# Patient Record
Sex: Female | Born: 1950 | Race: White | Hispanic: No | Marital: Married | Smoking: Former smoker
Health system: Southern US, Community
[De-identification: ages and names within clinical notes are randomized; demographics above are authoritative.]

## PROBLEM LIST (undated history)

## (undated) DIAGNOSIS — J309 Allergic rhinitis, unspecified: Secondary | ICD-10-CM

## (undated) DIAGNOSIS — F419 Anxiety disorder, unspecified: Secondary | ICD-10-CM

## (undated) DIAGNOSIS — I10 Essential (primary) hypertension: Secondary | ICD-10-CM

## (undated) DIAGNOSIS — R922 Inconclusive mammogram: Secondary | ICD-10-CM

## (undated) DIAGNOSIS — N841 Polyp of cervix uteri: Secondary | ICD-10-CM

## (undated) DIAGNOSIS — IMO0002 Reserved for concepts with insufficient information to code with codable children: Secondary | ICD-10-CM

## (undated) DIAGNOSIS — I480 Paroxysmal atrial fibrillation: Secondary | ICD-10-CM

## (undated) DIAGNOSIS — E7800 Pure hypercholesterolemia, unspecified: Secondary | ICD-10-CM

## (undated) DIAGNOSIS — M8580 Other specified disorders of bone density and structure, unspecified site: Secondary | ICD-10-CM

## (undated) DIAGNOSIS — R9230 Dense breasts, unspecified: Secondary | ICD-10-CM

## (undated) HISTORY — DX: Dense breasts, unspecified: R92.30

## (undated) HISTORY — DX: Pure hypercholesterolemia, unspecified: E78.00

## (undated) HISTORY — DX: Essential (primary) hypertension: I10

## (undated) HISTORY — DX: Other specified disorders of bone density and structure, unspecified site: M85.80

## (undated) HISTORY — DX: Allergic rhinitis, unspecified: J30.9

## (undated) HISTORY — DX: Inconclusive mammogram: R92.2

## (undated) HISTORY — DX: Reserved for concepts with insufficient information to code with codable children: IMO0002

## (undated) HISTORY — DX: Polyp of cervix uteri: N84.1

## (undated) SURGICAL SUPPLY — 1 items: PAD DEFIB RADIO PHYSIO CONN (PAD) ×1 IMPLANT

---

## 1992-11-14 DIAGNOSIS — R87619 Unspecified abnormal cytological findings in specimens from cervix uteri: Secondary | ICD-10-CM

## 1992-11-14 DIAGNOSIS — IMO0002 Reserved for concepts with insufficient information to code with codable children: Secondary | ICD-10-CM

## 1992-11-14 HISTORY — DX: Reserved for concepts with insufficient information to code with codable children: IMO0002

## 1992-11-14 HISTORY — DX: Unspecified abnormal cytological findings in specimens from cervix uteri: R87.619

## 1993-01-12 DIAGNOSIS — N841 Polyp of cervix uteri: Secondary | ICD-10-CM | POA: Insufficient documentation

## 1993-01-12 HISTORY — DX: Polyp of cervix uteri: N84.1

## 1993-01-27 HISTORY — PX: OTHER SURGICAL HISTORY: SHX169

## 1993-03-14 HISTORY — PX: CERVICAL BIOPSY  W/ LOOP ELECTRODE EXCISION: SUR135

## 1999-12-22 ENCOUNTER — Encounter: Payer: Self-pay | Admitting: Obstetrics and Gynecology

## 1999-12-22 ENCOUNTER — Encounter: Admission: RE | Admit: 1999-12-22 | Discharge: 1999-12-22 | Payer: Self-pay | Admitting: Obstetrics and Gynecology

## 2001-06-22 ENCOUNTER — Other Ambulatory Visit: Admission: RE | Admit: 2001-06-22 | Discharge: 2001-06-22 | Payer: Self-pay | Admitting: Obstetrics and Gynecology

## 2003-02-20 ENCOUNTER — Other Ambulatory Visit: Admission: RE | Admit: 2003-02-20 | Discharge: 2003-02-20 | Payer: Self-pay | Admitting: Obstetrics and Gynecology

## 2003-03-10 ENCOUNTER — Encounter: Payer: Self-pay | Admitting: Obstetrics and Gynecology

## 2003-03-10 ENCOUNTER — Encounter: Admission: RE | Admit: 2003-03-10 | Discharge: 2003-03-10 | Payer: Self-pay | Admitting: Obstetrics and Gynecology

## 2004-07-08 ENCOUNTER — Other Ambulatory Visit: Admission: RE | Admit: 2004-07-08 | Discharge: 2004-07-08 | Payer: Self-pay | Admitting: Obstetrics and Gynecology

## 2004-09-16 ENCOUNTER — Encounter: Admission: RE | Admit: 2004-09-16 | Discharge: 2004-09-16 | Payer: Self-pay | Admitting: Obstetrics and Gynecology

## 2006-05-24 ENCOUNTER — Other Ambulatory Visit: Admission: RE | Admit: 2006-05-24 | Discharge: 2006-05-24 | Payer: Self-pay | Admitting: Obstetrics and Gynecology

## 2007-06-01 ENCOUNTER — Other Ambulatory Visit: Admission: RE | Admit: 2007-06-01 | Discharge: 2007-06-01 | Payer: Self-pay | Admitting: Obstetrics and Gynecology

## 2008-05-20 ENCOUNTER — Encounter: Admission: RE | Admit: 2008-05-20 | Discharge: 2008-05-20 | Payer: Self-pay | Admitting: Obstetrics and Gynecology

## 2008-06-13 ENCOUNTER — Other Ambulatory Visit: Admission: RE | Admit: 2008-06-13 | Discharge: 2008-06-13 | Payer: Self-pay | Admitting: Obstetrics and Gynecology

## 2010-07-27 ENCOUNTER — Encounter: Admission: RE | Admit: 2010-07-27 | Discharge: 2010-07-27 | Payer: Self-pay | Admitting: Obstetrics and Gynecology

## 2010-07-27 LAB — HM DEXA SCAN

## 2011-08-10 ENCOUNTER — Other Ambulatory Visit: Payer: Self-pay | Admitting: Obstetrics and Gynecology

## 2011-08-10 DIAGNOSIS — Z1231 Encounter for screening mammogram for malignant neoplasm of breast: Secondary | ICD-10-CM

## 2011-09-19 ENCOUNTER — Ambulatory Visit: Payer: Self-pay

## 2011-10-25 ENCOUNTER — Ambulatory Visit
Admission: RE | Admit: 2011-10-25 | Discharge: 2011-10-25 | Disposition: A | Discharge status: Home / Self Care | Payer: BC Managed Care – PPO | Source: Ambulatory Visit | Attending: Obstetrics and Gynecology | Admitting: Obstetrics and Gynecology

## 2011-10-25 DIAGNOSIS — Z1231 Encounter for screening mammogram for malignant neoplasm of breast: Secondary | ICD-10-CM

## 2012-09-14 LAB — HM PAP SMEAR

## 2013-07-19 ENCOUNTER — Other Ambulatory Visit: Payer: Self-pay | Admitting: Family Medicine

## 2013-07-19 DIAGNOSIS — Z1231 Encounter for screening mammogram for malignant neoplasm of breast: Secondary | ICD-10-CM

## 2013-08-26 ENCOUNTER — Ambulatory Visit
Admission: RE | Admit: 2013-08-26 | Discharge: 2013-08-26 | Disposition: A | Discharge status: Home / Self Care | Payer: BC Managed Care – PPO | Source: Ambulatory Visit | Attending: Family Medicine | Admitting: Family Medicine

## 2013-08-26 DIAGNOSIS — Z1231 Encounter for screening mammogram for malignant neoplasm of breast: Secondary | ICD-10-CM

## 2013-08-27 LAB — HM MAMMOGRAPHY

## 2013-09-20 ENCOUNTER — Ambulatory Visit: Payer: Self-pay | Admitting: Obstetrics and Gynecology

## 2013-09-23 ENCOUNTER — Ambulatory Visit: Payer: Self-pay | Admitting: Obstetrics and Gynecology

## 2013-10-08 ENCOUNTER — Telehealth: Payer: Self-pay | Admitting: Obstetrics and Gynecology

## 2013-10-08 NOTE — Telephone Encounter (Signed)
Return call to patient, Destiny Elliott.  

## 2013-10-08 NOTE — Telephone Encounter (Signed)
Patient calling asking for Destiny Elliott to help her get worked into Dr. Rica Records schedule for this year for an AEX. [name] AEX was cancelled twice with Dr. Edward Jolly. Patient does want to still see Dr. Edward Jolly however.

## 2013-10-14 NOTE — Telephone Encounter (Signed)
Appointment for AEX scheduled for 11-13-13 at 1pm. Patient agreeable to date and time. Routing to provider for final review. Patient agreeable to disposition. Will close encounter

## 2013-11-13 ENCOUNTER — Ambulatory Visit: Payer: Self-pay | Admitting: Obstetrics and Gynecology

## 2013-11-29 ENCOUNTER — Encounter: Payer: Self-pay | Admitting: Nurse Practitioner

## 2013-11-29 ENCOUNTER — Ambulatory Visit (INDEPENDENT_AMBULATORY_CARE_PROVIDER_SITE_OTHER): Payer: BC Managed Care – PPO | Admitting: Nurse Practitioner

## 2013-11-29 VITALS — BP 140/84 | HR 52 | Ht 61.5 in | Wt 131.0 lb

## 2013-11-29 DIAGNOSIS — Z01419 Encounter for gynecological examination (general) (routine) without abnormal findings: Secondary | ICD-10-CM

## 2013-11-29 DIAGNOSIS — Z0000 Encounter for general adult medical examination without abnormal findings: Secondary | ICD-10-CM

## 2013-11-29 NOTE — Progress Notes (Signed)
Patient ID: Destiny Elliott, female   DOB: [birth year], 63 y.o.   MRN: [id number] 63 y.o. G2P2002 Married Caucasian Fe here for annual exam.  Son died August 02, 2013.  He was gay and on multiple medications and committed suicide.  She also has to deal with her husband who is a diabetic. She  Is seeking therapy.  No LMP recorded. Patient is postmenopausal.          Sexually active: yes  The current method of family planning is post menopausal status.    Exercising: yes  Home exercise routine includes walking. Smoker:  no  Health Maintenance: Pap:  09/14/12, WNL MMG:  08/27/13, normal Colonoscopy:  never BMD:   07/27/10, normal, repeat in 3 years TDaP:  2008 Labs: PCP   reports that she quit smoking about 9 years ago. Her smoking use included Cigarettes. She has a 14 pack-year smoking history. She has never used smokeless tobacco. She reports that she drinks alcohol. She reports that she does not use illicit drugs.  Past Medical History  Diagnosis Date  . 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 Hypertension   . Dense breasts   . Endocervical polyp 01/1993    -benign    Past Surgical History  Procedure Laterality Date  . Cervical biopsy  w/ loop electrode excision  03/1993    CIN I  . Endocervical polyp  01-27-93    -benign    Current Outpatient Prescriptions  Medication Sig Dispense Refill  . ALPRAZolam (XANAX) 0.5 MG tablet Take 1 tablet by mouth 4 (four) times daily.       Marland Kitchen atenolol (TENORMIN) 50 MG tablet Take 100 mg by mouth daily.       . cholecalciferol (VITAMIN D) 1000 UNITS tablet Take 1,000 Units by mouth daily.      . vitamin B-12 (CYANOCOBALAMIN) 1000 MCG tablet Take 1,000 mcg by mouth daily.       No current facility-administered medications for this visit.    Family History  Problem Relation Age of Onset  . Stroke Father   . Kidney disease Father   . Hypertension Father   . Thyroid disease Sister   . Hypertension Sister   . Hyperlipidemia Sister   . Thyroid disease  Mother   . Heart failure Mother   . Heart disease Mother   . Hypertension Mother   . COPD Mother   . Heart disease Brother   . Heart disease Brother     ROS:  Pertinent items are noted in HPI.  Otherwise, a comprehensive ROS was negative.  Exam:   BP 140/84  Pulse 52  Ht 5' 1.5" (1.562 m)  Wt 131 lb (59.421 kg)  BMI 24.35 kg/m2 Height: 5' 1.5" (156.2 cm)  Ht Readings from Last 3 Encounters:  11/29/13 5' 1.5" (1.562 m)   General: patient was tense, nervous, tearful and hard to remember data of dates and times of events over the past several months.  Poor historian at this time. General appearance: alert, cooperative and appears stated age Head: Normocephalic, without obvious abnormality, atraumatic Neck: no adenopathy, supple, symmetrical, trachea midline and thyroid normal to inspection and palpation Lungs: clear to auscultation bilaterally Breasts: normal appearance, no masses or tenderness Heart: regular rate and rhythm Abdomen: soft, non-tender; no masses,  no organomegaly Extremities: extremities normal, atraumatic, no cyanosis or edema Skin: Skin color, texture, turgor normal. No rashes or lesions Lymph nodes: Cervical, supraclavicular, and axillary nodes normal. No abnormal inguinal nodes  palpated Neurologic: Grossly normal   Pelvic: External genitalia:  no lesions              Urethra:  normal appearing urethra with no masses, tenderness or lesions              Bartholin's and Skene's: normal                 Vagina: normal appearing vagina with normal color and discharge, no lesions              Cervix: anteverted              Pap taken: yes Bimanual Exam:  Uterus:  normal size, contour, position, consistency, mobility, non-tender              Adnexa: no mass, fullness, tenderness               Rectovaginal: Confirms               Anus:  normal sphincter tone, no lesions  A:  Well Woman with normal exam  Postmenopausal  History of anxiety and depression that is  grief reaction - death of son 07-27-13 from suicide.  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  Counseled on breast self exam, mammography screening, adequate intake of calcium and vitamin D, diet and exercise return annually or prn  An After Visit Summary was printed and given to the patient.

## 2013-11-29 NOTE — Patient Instructions (Addendum)

## 2013-12-01 NOTE — Progress Notes (Signed)
Encounter reviewed by Dr. Whitnie Deleon Silva.  

## 2013-12-04 LAB — IPS PAP TEST WITH HPV

## 2014-06-03 ENCOUNTER — Telehealth: Payer: Self-pay | Admitting: Nurse Practitioner

## 2014-06-03 NOTE — Telephone Encounter (Signed)
Spoke with patient. Patient states that she has been constipated for months. Has been trying to eat more fiber daily but has been having to strain with bowel movements. Patient noticed "pink blood" when wiping after a bowel movement yesterday and "blood in my stool" today after a bowel movement. Patient has not had a colonoscopy but state that her sisters have and they have had polyps. With bowel movements patient states that she has not been eliminating a lot. "I have not been regular in over a year." Patient has not used any OTC stool softeners. Patient prefers to be seen by our office. Requesting early morning appointment. Appointment scheduled for tomorrow at 8:30am with Regina Eck CNM as Milford Cage, FNP will be out of the office. Patient agreeable.  Routing to [employer], FNP  Cc: Regina Eck CNM   Routing to provider for final review. Patient agreeable to disposition. Will close encounter

## 2014-06-03 NOTE — Telephone Encounter (Signed)
Patient is having rectal bleeding. Patient is asking for an appointment with Destiny Elliott.

## 2014-06-04 ENCOUNTER — Ambulatory Visit (INDEPENDENT_AMBULATORY_CARE_PROVIDER_SITE_OTHER): Payer: BC Managed Care – PPO | Admitting: Certified Nurse Midwife

## 2014-06-04 ENCOUNTER — Encounter: Payer: Self-pay | Admitting: Certified Nurse Midwife

## 2014-06-04 ENCOUNTER — Telehealth: Payer: Self-pay

## 2014-06-04 VITALS — BP 150/80 | HR 60 | Temp 98.6°F | Resp 18 | Wt 133.0 lb

## 2014-06-04 DIAGNOSIS — Z1211 Encounter for screening for malignant neoplasm of colon: Secondary | ICD-10-CM

## 2014-06-04 DIAGNOSIS — K921 Melena: Secondary | ICD-10-CM

## 2014-06-04 LAB — POCT URINALYSIS DIPSTICK
Bilirubin, UA: NEGATIVE
Blood, UA: NEGATIVE
Glucose, UA: NEGATIVE
Ketones, UA: NEGATIVE
Leukocytes, UA: NEGATIVE
Nitrite, UA: NEGATIVE
Protein, UA: NEGATIVE
Urobilinogen, UA: NEGATIVE
pH, UA: 7

## 2014-06-04 LAB — CBC WITH DIFFERENTIAL/PLATELET
Basophils Absolute: 0.1 10*3/uL (ref 0.0–0.1)
Basophils Relative: 1 % (ref 0–1)
Eosinophils Absolute: 0.1 10*3/uL (ref 0.0–0.7)
Eosinophils Relative: 1 % (ref 0–5)
HCT: 41.1 % (ref 36.0–46.0)
Hemoglobin: 13.5 g/dL (ref 12.0–15.0)
Lymphocytes Relative: 20 % (ref 12–46)
Lymphs Abs: 1.5 10*3/uL (ref 0.7–4.0)
MCH: 29.9 pg (ref 26.0–34.0)
MCHC: 32.8 g/dL (ref 30.0–36.0)
MCV: 91.1 fL (ref 78.0–100.0)
Monocytes Absolute: 0.8 10*3/uL (ref 0.1–1.0)
Monocytes Relative: 10 % (ref 3–12)
Neutro Abs: 5.1 10*3/uL (ref 1.7–7.7)
Neutrophils Relative %: 68 % (ref 43–77)
Platelets: 314 10*3/uL (ref 150–400)
RBC: 4.51 MIL/uL (ref 3.87–5.11)
RDW: 13.5 % (ref 11.5–15.5)
WBC: 7.5 10*3/uL (ref 4.0–10.5)

## 2014-06-04 LAB — FECAL OCCULT BLOOD, IMMUNOCHEMICAL: IFOBT: NEGATIVE

## 2014-06-04 NOTE — Telephone Encounter (Signed)
Patients stool sample was negative for blood

## 2014-06-04 NOTE — Patient Instructions (Signed)
Colonoscopy  A colonoscopy is an exam to look at the entire large intestine (colon). This exam can help find problems such as tumors, polyps, inflammation, and areas of bleeding. The exam takes about 1 hour.   LET YOUR HEALTH CARE PROVIDER KNOW ABOUT:   · Any allergies you have.  · All medicines you are taking, including vitamins, herbs, eye drops, creams, and over-the-counter medicines.  · Previous problems you or members of your family have had with the use of anesthetics.  · Any blood disorders you have.  · Previous surgeries you have had.  · Medical conditions you have.  RISKS AND COMPLICATIONS   Generally, this is a safe procedure. However, as with any procedure, complications can occur. Possible complications include:  · Bleeding.  · Tearing or rupture of the colon wall.  · Reaction to medicines given during the exam.  · Infection (rare).  BEFORE THE PROCEDURE   · Ask your health care provider about changing or stopping your regular medicines.  · You may be prescribed an oral bowel prep. This involves drinking a large amount of medicated liquid, starting the day before your procedure. The liquid will cause you to have multiple loose stools until your stool is almost clear or light green. This cleans out your colon in preparation for the procedure.  · Do not eat or drink anything else once you have started the bowel prep, unless your health care provider tells you it is safe to do so.  · Arrange for someone to drive you home after the procedure.  PROCEDURE   · You will be given medicine to help you relax (sedative).  · You will lie on your side with your knees bent.  · A long, flexible tube with a light and camera on the end (colonoscope) will be inserted through the rectum and into the colon. The camera sends video back to a computer screen as it moves through the colon. The colonoscope also releases carbon dioxide gas to inflate the colon. This helps your health care provider see the area better.  · During  the exam, your health care provider may take a small tissue sample (biopsy) to be examined under a microscope if any abnormalities are found.  · The exam is finished when the entire colon has been viewed.  AFTER THE PROCEDURE   · Do not drive for 24 hours after the exam.  · You may have a small amount of blood in your stool.  · You may pass moderate amounts of gas and have mild abdominal cramping or bloating. This is caused by the gas used to inflate your colon during the exam.  · Ask when your test results will be ready and how you will get your results. Make sure you get your test results.  Document Released: 10/28/2000 Document Revised: 08/21/2013 Document Reviewed: 07/08/2013  ExitCare® Patient Information ©2015 [employer]. This information is not intended to replace advice given to you by your health care provider. Make sure you discuss any questions you have with your health care provider.

## 2014-06-04 NOTE — Progress Notes (Signed)
   Subjective:    Patient ID: Destiny Elliott, female    DOB: 1951-07-01, 63 y.o. G2P2002 married.   MRN: [id number]  HPI Patient here complaining of constipation for several months.Patient has recently started on Ambien due to insomnia. Patient also is on Xanax twice daily for anxiety due to grieving process with loss of son. Patient feels the Xanax is causing the problem and feels her PCP is not sympathetic to my status. She is in therapeutic grief counseling with Hospice, but it is not available in July. Plans to restart 06/14/14 Patient saw blood in stool in the past two days and small amount on tissue. Denies beets in diet. Denies abdominal pain, black tarry stools or nausea or vomiting. Patient has family history of sister with polyps,benign. Patient has not had colonoscopy. She brought stool in today if needed to be checked, but did not see blood this am. No other health issues.    Review of Systems  Constitutional: Negative.   Gastrointestinal: Positive for constipation and blood in stool. Negative for nausea, vomiting, abdominal pain, diarrhea, abdominal distention and rectal pain.  Genitourinary: Negative.   Neurological: Negative for weakness.  Psychiatric/Behavioral: The patient is not nervous/anxious.        Objective:   Physical Exam  Constitutional: She appears well-developed and well-nourished.  HENT:  Head: Normocephalic.  Abdominal: Soft. Bowel sounds are normal. She exhibits no distension and no mass. There is no tenderness. There is no rebound and no guarding.  Genitourinary: Guaiac negative stool.  Neurological: She is alert.  Skin: Skin is warm and dry.  Psychiatric: She has a normal mood and affect. Judgment normal.  Anal opening: no thrombosed hemorrhoids noted Rectal exam: small amount of pink tinged mucous noted on gloved finger, no frank bleeding noted        Assessment & Plan:  A: History of visual blood noted in stool for 2 days Normal abdominal  exam Guaiac stool negative Colonoscopy never done Anxiety related to social stress with suicidal death of son, on medication with PCP management  P:Discussed normal abdominal exam and findings on rectal exam. Discussed to work with constipation with stool softener, increase fiber and water in diet. Discussed medication use can change bowel regularity and fluid and fiber very important.  Recommend referral to GI for evaluation and colonoscopy. Patient agreeable Encouraged to continue follow up with grief counseling and PCP as indicated.  RV prn

## 2014-06-05 NOTE — Telephone Encounter (Signed)
Pt notified in result note.  

## 2014-06-05 NOTE — Progress Notes (Signed)
Note reviewed, agree with plan.  Jenayah Antu, MD  

## 2014-06-09 ENCOUNTER — Telehealth: Payer: Self-pay | Admitting: Nurse Practitioner

## 2014-06-09 NOTE — Telephone Encounter (Signed)
Patient wants some clarifications about results on her blood wants to know what exactly we tested

## 2014-06-09 NOTE — Telephone Encounter (Signed)
Spoke with patient. Patient would like to know if the blood work she had done on 7/22 included her Vitamin D and glucose. Advised patient that these levels were not drawn on that day. Patient states that she has had them drawn at another doctor but would like to know what these levels are. Advised could send a message to Milford Cage, FNP to let her know and we could get her scheduled for a lab appointment. Patient is agreeable but would like to wait to schedule as she has a doctors appointment tomorrow. Will call back to schedule lab appointment.  Milford Cage, FNP  Okay to place orders at this time for patient's return call?

## 2014-06-11 NOTE — Telephone Encounter (Signed)
Ask me about this - need clarification.

## 2014-06-12 NOTE — Telephone Encounter (Signed)
Spoke with patient. Patient states that she went for a colonoscopy consult the other day and has not had levels drawn yet. Patient states that she would like to have them drawn at our office but would like to wait until after colonoscopy. She will call back to schedule appointment. Patient states "I can't get my thoughts together. I have barely slept. I have been up for 19 hours. I take Lorrin Mais but it is not working." Patient requesting to know some where she could go to get help as her doctor is out of town and she has not hear back from their office yet. "Where could I go when I just don't feel right because I can't sleep. Not like a clinic. Isn't there a place close by to you? Women's hospital?" Advised patient she would need to be seen at [facility] or [facility] as [facility] hospital specializes in women's health problems. Patient feels stable just off. Denies SI/HI. Advised to seek care or call someone immediately if she needs help. "I just think it is from lack of sleep but I wanted to be safe with my doctor out of town." Patient will seek care if needed.  Routing to provider for final review. Patient agreeable to disposition. Will close encounter

## 2014-06-12 NOTE — Telephone Encounter (Signed)
Left message to call Grasiela Jonsson at [phone]. 

## 2014-06-18 ENCOUNTER — Telehealth: Payer: Self-pay | Admitting: Family Medicine

## 2014-06-18 NOTE — Telephone Encounter (Signed)
Pt would like to know if you would accept her as a pt? Dr Payton Spark referred her to you.  I explained it would be nov or dec, but pt doesn't want to wait that long.  pls advise

## 2014-08-29 ENCOUNTER — Other Ambulatory Visit: Payer: Self-pay

## 2014-09-15 ENCOUNTER — Encounter: Payer: Self-pay | Admitting: Certified Nurse Midwife

## 2014-10-21 ENCOUNTER — Other Ambulatory Visit: Payer: Self-pay

## 2014-10-21 DIAGNOSIS — Z1231 Encounter for screening mammogram for malignant neoplasm of breast: Secondary | ICD-10-CM

## 2014-10-30 ENCOUNTER — Other Ambulatory Visit: Payer: Self-pay | Admitting: Family Medicine

## 2014-10-30 DIAGNOSIS — E2839 Other primary ovarian failure: Secondary | ICD-10-CM

## 2014-10-31 ENCOUNTER — Ambulatory Visit: Payer: BC Managed Care – PPO

## 2014-11-14 HISTORY — PX: COLONOSCOPY: SHX174

## 2014-11-21 ENCOUNTER — Ambulatory Visit: Payer: BC Managed Care – PPO

## 2014-11-21 ENCOUNTER — Other Ambulatory Visit: Payer: BC Managed Care – PPO

## 2014-12-01 ENCOUNTER — Ambulatory Visit
Admission: RE | Admit: 2014-12-01 | Discharge: 2014-12-01 | Disposition: A | Discharge status: Home / Self Care | Payer: Self-pay | Source: Ambulatory Visit | Attending: Family Medicine | Admitting: Family Medicine

## 2014-12-01 ENCOUNTER — Ambulatory Visit
Admission: RE | Admit: 2014-12-01 | Discharge: 2014-12-01 | Disposition: A | Discharge status: Home / Self Care | Payer: BLUE CROSS/BLUE SHIELD | Source: Ambulatory Visit

## 2014-12-01 DIAGNOSIS — E2839 Other primary ovarian failure: Secondary | ICD-10-CM

## 2014-12-01 DIAGNOSIS — Z1231 Encounter for screening mammogram for malignant neoplasm of breast: Secondary | ICD-10-CM

## 2014-12-02 ENCOUNTER — Ambulatory Visit (INDEPENDENT_AMBULATORY_CARE_PROVIDER_SITE_OTHER): Payer: BLUE CROSS/BLUE SHIELD | Admitting: Nurse Practitioner

## 2014-12-02 ENCOUNTER — Encounter: Payer: Self-pay | Admitting: Nurse Practitioner

## 2014-12-02 VITALS — BP 140/82 | HR 64 | Ht 61.25 in | Wt 137.0 lb

## 2014-12-02 DIAGNOSIS — Z01419 Encounter for gynecological examination (general) (routine) without abnormal findings: Secondary | ICD-10-CM

## 2014-12-02 DIAGNOSIS — E559 Vitamin D deficiency, unspecified: Secondary | ICD-10-CM

## 2014-12-02 DIAGNOSIS — E538 Deficiency of other specified B group vitamins: Secondary | ICD-10-CM

## 2014-12-02 DIAGNOSIS — Z0000 Encounter for general adult medical examination without abnormal findings: Secondary | ICD-10-CM

## 2014-12-02 LAB — POCT URINALYSIS DIPSTICK
Bilirubin, UA: NEGATIVE
Blood, UA: NEGATIVE
Glucose, UA: NEGATIVE
Ketones, UA: NEGATIVE
Leukocytes, UA: NEGATIVE
Nitrite, UA: NEGATIVE
Protein, UA: NEGATIVE
Urobilinogen, UA: NEGATIVE
pH, UA: 6

## 2014-12-02 LAB — VITAMIN B12: Vitamin B-12: 669 pg/mL (ref 211–911)

## 2014-12-02 NOTE — Progress Notes (Signed)
Patient ID: Destiny Elliott, female   DOB: Nov 06, 1951, 64 y.o.   MRN: [id number] 64 y.o. G27P2002 Married  Caucasian Fe here for annual exam.  No vaso symptoms. Has had insomnia for years.  Now with a lower dose of BP med's sleep is better. Feels well. She had been off her Vit D supplements until Sunday and took Vit D 2000 IU.  Yesterday she felt quite blue and sad and thought this may be a side effect of med's.  She does admit this time of the year has been a hard time for her since her sons dearth 07/2013.   Patient's last menstrual period was 10/14/2002.          Sexually active: yes  The current method of family planning is post menopausal status.  Exercising: yes Home exercise routine includes walking. Smoker: no  Health Maintenance: Pap: 11/29/13, negative with neg HR HPV; history of LEEP, CIN I 1994 MMG: 12/01/14, no results at time of visit Colonoscopy: 07/14/14, hyperplastic polyp x 1 - recheck 10 years BMD: 12/01/14, T-Score -1.3 Spine/-1.5 Left hip TDaP: 10/2014 Labs:  PCP   Urine:  Negative    reports that she quit smoking about 10 years ago. Her smoking use included Cigarettes. She has a 14 pack-year smoking history. She has never used smokeless tobacco. She reports that she drinks alcohol. She reports that she does not use illicit drugs.  Past Medical History  Diagnosis Date  . 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 Hypertension   . Dense breasts   . Endocervical polyp 01/1993    -benign    Past Surgical History  Procedure Laterality Date  . Cervical biopsy  w/ loop electrode excision  03/1993    CIN I  . Endocervical polyp  01-27-93    -benign    Current Outpatient Prescriptions  Medication Sig Dispense Refill  . ALPRAZolam (XANAX) 0.25 MG tablet Take 1 tablet by mouth as needed.  0  . atenolol (TENORMIN) 50 MG tablet Take 50 mg by mouth daily.     . hydrOXYzine (ATARAX/VISTARIL) 25 MG tablet Take 1 tablet by mouth daily.  0  . cholecalciferol (VITAMIN D) 1000  UNITS tablet Take 1,000 Units by mouth daily.    . vitamin B-12 (CYANOCOBALAMIN) 1000 MCG tablet Take 1,000 mcg by mouth daily.     No current facility-administered medications for this visit.    Family History  Problem Relation Age of Onset  . Stroke Father   . Kidney disease Father   . Hypertension Father   . Thyroid disease Sister   . Hypertension Sister   . Hyperlipidemia Sister   . Thyroid disease Mother   . Heart failure Mother   . Heart disease Mother   . Hypertension Mother   . COPD Mother   . Heart disease Brother   . Heart disease Brother     ROS:  Pertinent items are noted in HPI.  Otherwise, a comprehensive ROS was negative.  Exam:   BP 140/82 mmHg  Pulse 64  Ht 5' 1.25" (1.556 m)  Wt 137 lb (62.143 kg)  BMI 25.67 kg/m2  LMP 10/14/2002 Height: 5' 1.25" (155.6 cm) Ht Readings from Last 3 Encounters:  12/02/14 5' 1.25" (1.556 m)  11/29/13 5' 1.5" (1.562 m)    General appearance: alert, cooperative and appears stated age Head: Normocephalic, without obvious abnormality, atraumatic Neck: no adenopathy, supple, symmetrical, trachea midline and thyroid normal to inspection and palpation Lungs: clear  to auscultation bilaterally Breasts: normal appearance, no masses or tenderness Heart: regular rate and rhythm Abdomen: soft, non-tender; no masses,  no organomegaly Extremities: extremities normal, atraumatic, no cyanosis or edema Skin: Skin color, texture, turgor normal. No rashes or lesions Lymph nodes: Cervical, supraclavicular, and axillary nodes normal. No abnormal inguinal nodes palpated Neurologic: Grossly normal   Pelvic: External genitalia:  no lesions              Urethra:  normal appearing urethra with no masses, tenderness or lesions              Bartholin's and Skene's: normal                 Vagina: normal appearing vagina with normal color and discharge, no lesions              Cervix: anteverted              Pap taken: No. Bimanual Exam:   Uterus:  normal size, contour, position, consistency, mobility, non-tender              Adnexa: no mass, fullness, tenderness               Rectovaginal: Confirms               Anus:  normal sphincter tone, no lesions  Chaperone present: No  A:  Well Woman with normal exam  Postmenopausal History of anxiety and depression that is grief reaction - death of son 21-Aug-2013 from suicide. 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

## 2014-12-02 NOTE — Progress Notes (Signed)
Encounter reviewed by Dr. Meghann Landing Silva.  

## 2014-12-02 NOTE — Patient Instructions (Addendum)

## 2014-12-03 LAB — VITAMIN D 25 HYDROXY (VIT D DEFICIENCY, FRACTURES): Vit D, 25-Hydroxy: 26 ng/mL — ABNORMAL LOW (ref 30–100)

## 2014-12-08 ENCOUNTER — Telehealth: Payer: Self-pay | Admitting: Nurse Practitioner

## 2014-12-08 NOTE — Telephone Encounter (Signed)
Spoke with patient. Advised patient of results as seen below. Patient is agreeable. Patient states that she started taking OTC Vitamin D 2000 IU faily for three days. "I know this is going to sound really weird but I felt odd when I was taking it. I almost felt depressed. I was gloomy. Then I stopped taking it and I was fine." Denies any nausea, vomiting, or fatigue. Advised patient will need to speak with Milford Cage, FNP and return call with further recommendations and instructions. Patient is agreeable.   Notes Recorded by Milford Cage, FNP on 12/04/2014 at 8:42 AM Results via my chart:  Jane, the Vit D level is low. You will need to take 2000 IU daily OTC especially during the winter months. . You may take 2 of the 1000 IU if you like. The Vit B 12 level was normal.

## 2014-12-08 NOTE — Telephone Encounter (Signed)
Patient is calling for recent results. Last seen 12/02/14.

## 2014-12-08 NOTE — Telephone Encounter (Signed)
Spoke with patient. Advised patient of message as seen below from Destiny Elliott, [place]. Patient is agreeable and verbalizes understanding. "I think I may have just been having a day." Patient will call office if symptoms develop again with restarting Vitamin D.  Routing to provider for final review. Patient agreeable to disposition. Will close encounter

## 2014-12-08 NOTE — Telephone Encounter (Signed)
If she feels these symptoms were from Vit D then don't try again for a month - then retry and see if symptoms came back again - if so then we know they are related. ( I felt at [facility] these 'blue' symptoms were related to this time of year with Holiday's since her son's death in 03-10-2013. )

## 2015-12-04 ENCOUNTER — Encounter: Payer: Self-pay | Admitting: Nurse Practitioner

## 2015-12-04 ENCOUNTER — Ambulatory Visit (INDEPENDENT_AMBULATORY_CARE_PROVIDER_SITE_OTHER): Payer: BLUE CROSS/BLUE SHIELD | Admitting: Nurse Practitioner

## 2015-12-04 VITALS — BP 136/76 | HR 68 | Ht 62.5 in | Wt 145.0 lb

## 2015-12-04 DIAGNOSIS — Z01419 Encounter for gynecological examination (general) (routine) without abnormal findings: Secondary | ICD-10-CM

## 2015-12-04 DIAGNOSIS — E559 Vitamin D deficiency, unspecified: Secondary | ICD-10-CM | POA: Diagnosis not present

## 2015-12-04 DIAGNOSIS — Z0000 Encounter for general adult medical examination without abnormal findings: Secondary | ICD-10-CM | POA: Diagnosis not present

## 2015-12-04 LAB — POCT URINALYSIS DIPSTICK
Bilirubin, UA: NEGATIVE
Blood, UA: NEGATIVE
Glucose, UA: NEGATIVE
Ketones, UA: NEGATIVE
Leukocytes, UA: NEGATIVE
Nitrite, UA: NEGATIVE
Protein, UA: NEGATIVE
Urobilinogen, UA: NEGATIVE
pH, UA: 7

## 2015-12-04 NOTE — Progress Notes (Signed)
Patient ID: Destiny Elliott, female   DOB: 1951/11/10, 65 y.o.   MRN: [id number]  65 y.o. G19P2002 Married  Caucasian Fe here for annual exam.  Doing well.  New grand daughter born 06/21/15. Less depression over sons death.  Patient's last menstrual period was 10/14/2002.          Sexually active: Yes.    The current method of family planning is post menopausal status.    Exercising: Yes.    walking Smoker:  no  Health Maintenance: Pap:  11/29/13, negative with neg HR HPV MMG:  12/01/14, Bi-Rads 1:  Negative Colonoscopy: 07/14/14, hyperplastic polyp x 1 - recheck 10 years BMD: 12/01/14, T-Score -1.3 Spine/-1.5 Left hip TDaP: 10/2014 Shingles: not yet done Hep C and HIV: will do today Labs: Dr. Stephanie Acre takes care of all labs   reports that she quit smoking about 11 years ago. Her smoking use included Cigarettes. She has a 14 pack-year smoking history. She has never used smokeless tobacco. She reports that she drinks alcohol. She reports that she does not use illicit drugs.  Past Medical History  Diagnosis Date  . 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 Hypertension   . Dense breasts   . Endocervical polyp 01/1993    -benign    Past Surgical History  Procedure Laterality Date  . Cervical biopsy  w/ loop electrode excision  03/1993    CIN I  . Endocervical polyp  01-27-93    -benign    Current Outpatient Prescriptions  Medication Sig Dispense Refill  . ALPRAZolam (XANAX) 0.25 MG tablet Take 1 tablet by mouth as needed.  0  . atenolol (TENORMIN) 50 MG tablet Take 50 mg by mouth daily.      No current facility-administered medications for this visit.    Family History  Problem Relation Age of Onset  . Stroke Father   . Kidney disease Father   . Hypertension Father   . Thyroid disease Sister   . Hypertension Sister   . Hyperlipidemia Sister   . Thyroid disease Mother   . Heart failure Mother   . Heart disease Mother   . Hypertension Mother   . COPD Mother   . Heart disease  Brother   . Heart disease Brother   . Suicidality Son 39    07/20/2013    ROS:  Pertinent items are noted in HPI.  Otherwise, a comprehensive ROS was negative.  Exam:   BP 136/76 mmHg  Pulse 68  Ht 5' 2.5" (1.588 m)  Wt 145 lb (65.772 kg)  BMI 26.08 kg/m2  LMP 10/14/2002 Height: 5' 2.5" (158.8 cm) Ht Readings from Last 3 Encounters:  12/04/15 5' 2.5" (1.588 m)  12/02/14 5' 1.25" (1.556 m)  11/29/13 5' 1.5" (1.562 m)    General appearance: alert, cooperative and appears stated age Head: Normocephalic, without obvious abnormality, atraumatic Neck: no adenopathy, supple, symmetrical, trachea midline and thyroid normal to inspection and palpation Lungs: clear to auscultation bilaterally Breasts: normal appearance, no masses or tenderness Heart: regular rate and rhythm Abdomen: soft, non-tender; no masses,  no organomegaly Extremities: extremities normal, atraumatic, no cyanosis or edema Skin: Skin color, texture, turgor normal. No rashes or lesions Lymph nodes: Cervical, supraclavicular, and axillary nodes normal. No abnormal inguinal nodes palpated Neurologic: Grossly normal   Pelvic: External genitalia:  no lesions              Urethra:  normal appearing urethra with no masses, tenderness or  lesions              Bartholin's and Skene's: normal                 Vagina: normal appearing vagina with normal color and discharge, no lesions              Cervix: anteverted              Pap taken: Yes.   Bimanual Exam:  Uterus:  normal size, contour, position, consistency, mobility, non-tender              Adnexa: no mass, fullness, tenderness               Rectovaginal: Confirms               Anus:  normal sphincter tone, no lesions  Chaperone present: yes  A:  Well Woman with normal exam  Postmenopausal History of anxiety and depression that is grief reaction - death of son August 11, 2013 from suicide. 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

## 2015-12-04 NOTE — Patient Instructions (Signed)

## 2015-12-05 LAB — VITAMIN D 25 HYDROXY (VIT D DEFICIENCY, FRACTURES): Vit D, 25-Hydroxy: 28 ng/mL — ABNORMAL LOW (ref 30–100)

## 2015-12-05 LAB — HEPATITIS C ANTIBODY: HCV Ab: NEGATIVE

## 2015-12-05 LAB — HIV ANTIBODY (ROUTINE TESTING W REFLEX): HIV 1&2 Ab, 4th Generation: NONREACTIVE

## 2015-12-06 NOTE — Progress Notes (Signed)
Encounter reviewed by Dr. Pietro Bonura Amundson C. Silva.  

## 2015-12-08 LAB — IPS PAP TEST WITH HPV

## 2016-02-15 ENCOUNTER — Telehealth: Payer: Self-pay | Admitting: Nurse Practitioner

## 2016-02-15 NOTE — Telephone Encounter (Signed)
Patient calling to get her vit d results from January 2017

## 2016-02-15 NOTE — Telephone Encounter (Signed)
Spoke with patient. Patient states she has not received her results from January of this year as she does not use mychart. She is requesting the results of her Vitamin D level. All results as seen below provided to patient. She is agreeable and verbalizes understanding.  Notes Recorded by Kem Boroughs, FNP on 12/08/2015 at 5:00 PM pap02 Notes Recorded by Kem Boroughs, FNP on 12/06/2015 at 9:33 AM Results via my chart:  Jane, The HIV and Hep C was negative as expected. The Vit D was still lower that we would like at 28 compared to last year at 57. Maybe the cruise did help a little. Anyway best for you to take OTC Vit D 1000 IU daily.  Routing to provider for final review. Patient agreeable to disposition. Will close encounter.

## 2016-05-06 ENCOUNTER — Other Ambulatory Visit: Payer: Self-pay | Admitting: Internal Medicine

## 2016-07-14 ENCOUNTER — Other Ambulatory Visit: Payer: Self-pay | Admitting: Family Medicine

## 2016-07-14 DIAGNOSIS — Z1231 Encounter for screening mammogram for malignant neoplasm of breast: Secondary | ICD-10-CM

## 2016-07-21 ENCOUNTER — Ambulatory Visit
Admission: RE | Admit: 2016-07-21 | Discharge: 2016-07-21 | Disposition: A | Discharge status: Home / Self Care | Payer: Medicare HMO | Source: Ambulatory Visit | Attending: Family Medicine | Admitting: Family Medicine

## 2016-07-21 DIAGNOSIS — Z1231 Encounter for screening mammogram for malignant neoplasm of breast: Secondary | ICD-10-CM | POA: Diagnosis not present

## 2016-11-28 DIAGNOSIS — J069 Acute upper respiratory infection, unspecified: Secondary | ICD-10-CM | POA: Diagnosis not present

## 2016-12-12 ENCOUNTER — Encounter: Payer: Self-pay | Admitting: Nurse Practitioner

## 2016-12-12 ENCOUNTER — Ambulatory Visit (INDEPENDENT_AMBULATORY_CARE_PROVIDER_SITE_OTHER): Payer: Medicare HMO | Admitting: Nurse Practitioner

## 2016-12-12 VITALS — BP 154/92 | HR 72 | Ht 61.0 in | Wt 146.0 lb

## 2016-12-12 DIAGNOSIS — Z01419 Encounter for gynecological examination (general) (routine) without abnormal findings: Secondary | ICD-10-CM

## 2016-12-12 DIAGNOSIS — E2839 Other primary ovarian failure: Secondary | ICD-10-CM | POA: Diagnosis not present

## 2016-12-12 DIAGNOSIS — Z0000 Encounter for general adult medical examination without abnormal findings: Secondary | ICD-10-CM | POA: Diagnosis not present

## 2016-12-12 DIAGNOSIS — E559 Vitamin D deficiency, unspecified: Secondary | ICD-10-CM | POA: Diagnosis not present

## 2016-12-12 NOTE — Progress Notes (Signed)
Patient ID: Destiny Elliott, female   DOB: 1951-02-27, 66 y.o.   MRN: [id number]  66 y.o. G67P2002 Married  Caucasian Fe here for annual exam.  No new diagnosis this past year. Pearson daughter is now 98 yrs old.  She plans to retire or at least cut back this year.  Patient's last menstrual period was 10/14/2002 (approximate).          Sexually active: Yes.    The current method of family planning is post menopausal status.    Exercising: Yes.    walking Smoker:  no  Health Maintenance: Pap: 12/04/15, Negative with neg HR HPV MMG: 07/21/16, Bi-Rads 1: Negative Colonoscopy: 07/14/14, hyperplastic polyp x 1 - recheck 10 years BMD: 12/01/14, T-Score -1.3 Spine / -1.5 Left hip TDaP: 10/2014 Shingles: Never Pneumonia: Never Hep C and HIV: 12/04/15 Labs: PCP takes care of all labs, declined Vit D    reports that she quit smoking about 12 years ago. Her smoking use included Cigarettes. She has a 14.00 pack-year smoking history. She has never used smokeless tobacco. She reports that she drinks alcohol. She reports that she does not use drugs.  Past Medical History:  Diagnosis Date  . Abnormal Pap smear 1994   --CIN I  . Dense breasts   . Endocervical polyp 01/1993   -benign  . Hypertension     Past Surgical History:  Procedure Laterality Date  . CERVICAL BIOPSY  W/ LOOP ELECTRODE EXCISION  03/1993   CIN I  . endocervical polyp  01-27-93   -benign    Current Outpatient Prescriptions  Medication Sig Dispense Refill  . atenolol (TENORMIN) 50 MG tablet Take 50 mg by mouth daily.      No current facility-administered medications for this visit.     Family History  Problem Relation Age of Onset  . Stroke Father   . Kidney disease Father   . Hypertension Father   . Hypertension Sister   . Hyperlipidemia Sister   . COPD Sister   . Breast cancer Sister 55  . Thyroid disease Mother   . Heart failure Mother   . Heart disease Mother   . Hypertension Mother   . COPD Mother   . Heart  disease Brother     AFIB  . Suicidality Son 39    07/20/2013  . Thyroid disease Sister     ROS:  Pertinent items are noted in HPI.  Otherwise, a comprehensive ROS was negative.  Exam:   BP (!) 154/92 (BP Location: Right Arm, Patient Position: Sitting, Cuff Size: Normal)   Pulse 72   Ht 5\' 1"  (1.549 m)   Wt 146 lb (66.2 kg)   LMP 10/14/2002 (Approximate)   BMI 27.59 kg/m  Height: 5\' 1"  (154.9 cm) Ht Readings from Last 3 Encounters:  12/12/16 5\' 1"  (1.549 m)  12/04/15 5' 2.5" (1.588 m)  12/02/14 5' 1.25" (1.556 m)    General appearance: alert, cooperative and appears stated age Head: Normocephalic, without obvious abnormality, atraumatic Neck: no adenopathy, supple, symmetrical, trachea midline and thyroid normal to inspection and palpation Lungs: clear to auscultation bilaterally Breasts: normal appearance, no masses or tenderness Heart: regular rate and rhythm Abdomen: soft, non-tender; no masses,  no organomegaly Extremities: extremities normal, atraumatic, no cyanosis or edema,  Several varicose veins bilateral legs. Skin: Skin color, texture, turgor normal. No lesions but there is a rash under left breast that looks like allergic or heat related. Lymph nodes: Cervical, supraclavicular, and axillary nodes normal. No  abnormal inguinal nodes palpated Neurologic: Grossly normal   Pelvic: External genitalia:  no lesions              Urethra:  normal appearing urethra with no masses, tenderness or lesions              Bartholin's and Skene's: normal                 Vagina: normal appearing vagina with normal color and discharge, no lesions              Cervix: anteverted              Pap taken: No. 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

## 2016-12-12 NOTE — Patient Instructions (Addendum)

## 2016-12-14 NOTE — Progress Notes (Signed)
Encounter reviewed by Dr. Kirbie Stodghill Amundson C. Silva.  

## 2016-12-22 ENCOUNTER — Telehealth: Payer: Self-pay | Admitting: Nurse Practitioner

## 2016-12-22 DIAGNOSIS — J3489 Other specified disorders of nose and nasal sinuses: Secondary | ICD-10-CM | POA: Diagnosis not present

## 2016-12-22 DIAGNOSIS — K625 Hemorrhage of anus and rectum: Secondary | ICD-10-CM | POA: Diagnosis not present

## 2016-12-22 NOTE — Telephone Encounter (Signed)
Spoke with patient. Patient states around 8-9 pm last night she had a BM. Noticed blood in stool and with wiping. This occurred again later in the night. Denies any pain or dizziness. Called her PCP office and spoke with the doctor on call who gave her precautions and advised to call this morning for an appointment. Has appointment with PCP scheduled for today at 11:45 am. Patient asking when she had her last colonoscopy. Advised per our records last colonoscopy was 07/14/2014. Advised patient will need to keep appointment with PCP for evaluation and may need to see GI for evaluation as well following PCP appointment. Patient is agreeable and verbalizes understanding.  Routing to provider for final review. Patient agreeable to disposition. Will close encounter.

## 2016-12-22 NOTE — Telephone Encounter (Signed)
Patient calling for results. Says she noticed blood after a bowel movement last night.

## 2017-03-23 DIAGNOSIS — R69 Illness, unspecified: Secondary | ICD-10-CM | POA: Diagnosis not present

## 2017-03-23 DIAGNOSIS — J309 Allergic rhinitis, unspecified: Secondary | ICD-10-CM | POA: Diagnosis not present

## 2017-04-04 DIAGNOSIS — Z79899 Other long term (current) drug therapy: Secondary | ICD-10-CM | POA: Diagnosis not present

## 2017-04-04 DIAGNOSIS — I8390 Asymptomatic varicose veins of unspecified lower extremity: Secondary | ICD-10-CM | POA: Diagnosis not present

## 2017-04-04 DIAGNOSIS — I10 Essential (primary) hypertension: Secondary | ICD-10-CM | POA: Diagnosis not present

## 2017-04-04 DIAGNOSIS — M8580 Other specified disorders of bone density and structure, unspecified site: Secondary | ICD-10-CM | POA: Diagnosis not present

## 2017-04-04 DIAGNOSIS — Z0000 Encounter for general adult medical examination without abnormal findings: Secondary | ICD-10-CM | POA: Diagnosis not present

## 2017-04-04 DIAGNOSIS — R69 Illness, unspecified: Secondary | ICD-10-CM | POA: Diagnosis not present

## 2017-04-04 DIAGNOSIS — J309 Allergic rhinitis, unspecified: Secondary | ICD-10-CM | POA: Diagnosis not present

## 2017-04-04 DIAGNOSIS — E559 Vitamin D deficiency, unspecified: Secondary | ICD-10-CM | POA: Diagnosis not present

## 2017-04-23 DIAGNOSIS — I10 Essential (primary) hypertension: Secondary | ICD-10-CM | POA: Diagnosis not present

## 2017-04-23 DIAGNOSIS — J309 Allergic rhinitis, unspecified: Secondary | ICD-10-CM | POA: Diagnosis not present

## 2017-04-23 DIAGNOSIS — R002 Palpitations: Secondary | ICD-10-CM | POA: Diagnosis not present

## 2017-04-24 ENCOUNTER — Encounter: Payer: Self-pay | Admitting: Cardiovascular Disease

## 2017-05-05 ENCOUNTER — Ambulatory Visit: Payer: Medicare HMO | Admitting: Cardiovascular Disease

## 2017-05-08 DIAGNOSIS — R69 Illness, unspecified: Secondary | ICD-10-CM | POA: Diagnosis not present

## 2017-05-08 DIAGNOSIS — I10 Essential (primary) hypertension: Secondary | ICD-10-CM | POA: Diagnosis not present

## 2017-05-11 ENCOUNTER — Other Ambulatory Visit: Payer: Self-pay | Admitting: Family Medicine

## 2017-05-11 ENCOUNTER — Ambulatory Visit
Admission: RE | Admit: 2017-05-11 | Discharge: 2017-05-11 | Disposition: A | Discharge status: Home / Self Care | Payer: Medicare HMO | Source: Ambulatory Visit | Attending: Family Medicine | Admitting: Family Medicine

## 2017-05-11 DIAGNOSIS — R69 Illness, unspecified: Secondary | ICD-10-CM | POA: Diagnosis not present

## 2017-05-11 DIAGNOSIS — I10 Essential (primary) hypertension: Secondary | ICD-10-CM | POA: Diagnosis not present

## 2017-05-11 DIAGNOSIS — R0981 Nasal congestion: Secondary | ICD-10-CM | POA: Diagnosis not present

## 2017-05-11 DIAGNOSIS — K219 Gastro-esophageal reflux disease without esophagitis: Secondary | ICD-10-CM | POA: Diagnosis not present

## 2017-05-25 DIAGNOSIS — R69 Illness, unspecified: Secondary | ICD-10-CM | POA: Diagnosis not present

## 2017-05-25 DIAGNOSIS — I10 Essential (primary) hypertension: Secondary | ICD-10-CM | POA: Diagnosis not present

## 2017-05-25 DIAGNOSIS — J309 Allergic rhinitis, unspecified: Secondary | ICD-10-CM | POA: Diagnosis not present

## 2017-05-26 ENCOUNTER — Encounter (HOSPITAL_COMMUNITY): Payer: Self-pay | Admitting: Emergency Medicine

## 2017-05-26 ENCOUNTER — Emergency Department (HOSPITAL_COMMUNITY)
Admission: EM | Admit: 2017-05-26 | Discharge: 2017-05-26 | Disposition: A | Discharge status: Home / Self Care | Payer: Medicare HMO | Attending: Emergency Medicine | Admitting: Emergency Medicine

## 2017-05-26 ENCOUNTER — Emergency Department (HOSPITAL_COMMUNITY)
Admission: EM | Admit: 2017-05-26 | Discharge: 2017-05-26 | Disposition: A | Discharge status: Home / Self Care | Payer: Self-pay | Attending: Emergency Medicine | Admitting: Emergency Medicine

## 2017-05-26 DIAGNOSIS — F419 Anxiety disorder, unspecified: Secondary | ICD-10-CM

## 2017-05-26 DIAGNOSIS — Z87891 Personal history of nicotine dependence: Secondary | ICD-10-CM | POA: Insufficient documentation

## 2017-05-26 DIAGNOSIS — Z79899 Other long term (current) drug therapy: Secondary | ICD-10-CM | POA: Diagnosis not present

## 2017-05-26 DIAGNOSIS — Z5321 Procedure and treatment not carried out due to patient leaving prior to being seen by health care provider: Secondary | ICD-10-CM | POA: Insufficient documentation

## 2017-05-26 DIAGNOSIS — R69 Illness, unspecified: Secondary | ICD-10-CM | POA: Diagnosis not present

## 2017-05-26 LAB — COMPREHENSIVE METABOLIC PANEL
ALT: 19 U/L (ref 14–54)
AST: 32 U/L (ref 15–41)
Albumin: 4.5 g/dL (ref 3.5–5.0)
Alkaline Phosphatase: 57 U/L (ref 38–126)
Anion gap: 10 (ref 5–15)
BUN: 13 mg/dL (ref 6–20)
CO2: 26 mmol/L (ref 22–32)
Calcium: 9.3 mg/dL (ref 8.9–10.3)
Chloride: 105 mmol/L (ref 101–111)
Creatinine, Ser: 0.63 mg/dL (ref 0.44–1.00)
GFR calc Af Amer: >60 mL/min (ref >60)
GFR calc non Af Amer: >60 mL/min (ref >60)
Glucose, Bld: 93 mg/dL (ref 65–99)
Potassium: 3.8 mmol/L (ref 3.5–5.1)
Sodium: 141 mmol/L (ref 135–145)
Total Bilirubin: 0.6 mg/dL (ref 0.3–1.2)
Total Protein: 7.6 g/dL (ref 6.5–8.1)

## 2017-05-26 LAB — CBC WITH DIFFERENTIAL/PLATELET
Basophils Absolute: 0 10*3/uL (ref 0.0–0.1)
Basophils Relative: 0 %
Eosinophils Absolute: 0 10*3/uL (ref 0.0–0.7)
Eosinophils Relative: 1 %
HCT: 39.5 % (ref 36.0–46.0)
Hemoglobin: 13 g/dL (ref 12.0–15.0)
Lymphocytes Relative: 19 %
Lymphs Abs: 1.4 10*3/uL (ref 0.7–4.0)
MCH: 29.4 pg (ref 26.0–34.0)
MCHC: 32.9 g/dL (ref 30.0–36.0)
MCV: 89.4 fL (ref 78.0–100.0)
Monocytes Absolute: 0.8 10*3/uL (ref 0.1–1.0)
Monocytes Relative: 11 %
Neutro Abs: 4.9 10*3/uL (ref 1.7–7.7)
Neutrophils Relative %: 69 %
Platelets: 290 10*3/uL (ref 150–400)
RBC: 4.42 MIL/uL (ref 3.87–5.11)
RDW: 13.6 % (ref 11.5–15.5)
WBC: 7.1 10*3/uL (ref 4.0–10.5)

## 2017-05-26 LAB — RAPID URINE DRUG SCREEN, HOSP PERFORMED
Amphetamines: NOT DETECTED
Barbiturates: NOT DETECTED
Benzodiazepines: NOT DETECTED
Cocaine: NOT DETECTED
Opiates: NOT DETECTED
Tetrahydrocannabinol: NOT DETECTED

## 2017-05-26 LAB — ETHANOL: Alcohol, Ethyl (B): <5 mg/dL (ref <5)

## 2017-05-26 MED ORDER — ONDANSETRON HCL 4 MG PO TABS: 4.0000 mg | ORAL_TABLET | Freq: Three times a day (TID) | ORAL | Status: DC | PRN

## 2017-05-26 MED ORDER — ACETAMINOPHEN 325 MG PO TABS: 650.0000 mg | ORAL_TABLET | ORAL | Status: DC | PRN

## 2017-05-26 MED ORDER — ATENOLOL 50 MG PO TABS
50.0000 mg | ORAL_TABLET | Freq: Every day | ORAL | Status: DC
Start: 2017-05-26 — End: 2017-05-26
  Filled 2017-05-26: qty 1

## 2017-05-26 NOTE — ED Triage Notes (Signed)
Pt c/o anxiety, feeling isolated, pt with rapid speech. Denies SI/HI/AVH. States she has been visiting PCP frequently due to anxiety over her health. Recently husband has been in poor health and pt found out her blood pressure was too high as possible triggers.

## 2017-05-26 NOTE — BH Assessment (Addendum)
Tele Assessment Note   Destiny Elliott is an 66 y.o. female who presents voluntarily accompanied by reporting symptoms of anxiety ideation. Pt has a history of anxiety and says she was referred for assessment by her MD who wants her to get her medications adjusted. Pt reports medication gives hr side effects and she has been unable to find something to help her sleep. Pt denies SI, HI, AVH and SA. She has no IP history and went to OP 4 years ago when her son died.  Pt states current stressors include some recent health issues her husband had and her own health issues as well. 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 Supports include her whole family. Pt denies history of abuse and trauma. Pt reports there is a family history of suicide with ehr son 4 years ago. Pt has fair insight and judgment. Pt's memory is typical. Pt denies legal history. ? MSE: Pt is casually dressed, alert, oriented x4 with normal speech and normal motor behavior. Eye contact is good. Pt's mood is anxious and affect is congruent with mood. Thought process is coherent and relevant. There is no indication Pt is currently responding to internal stimuli or experiencing delusional thought content. Pt was cooperative throughout assessment.   Per Charlean Merl, NP, pt does not meet IP criteria and will be referred to IOP program. Discussed this with pt and Dr. Zenia Resides who agree with disposition. Faxed IOP information to triage.   Diagnosis: Anxiety Disorder  Past Medical History:  Past Medical History:  Diagnosis Date  . Abnormal Pap smear 1994   --CIN I  . Allergic rhinitis   . Dense breasts   . Endocervical polyp 01/1993   -benign  . Hypercholesterolemia   . Hypertension   . Osteopenia     Past Surgical History:  Procedure Laterality Date  . CERVICAL BIOPSY  W/ LOOP ELECTRODE EXCISION  03/1993   CIN I  .  COLONOSCOPY  2016  . endocervical polyp  01-27-93   -benign    Family History:  Family History  Problem Relation Age of Onset  . Stroke Father 70  . Kidney disease Father   . Hypertension Father   . Hypertension Sister 84  . Hyperlipidemia Sister   . COPD Sister   . Breast cancer Sister 61  . Thyroid disease Mother 29  . Heart failure Mother   . Heart disease Mother   . Hypertension Mother   . COPD Mother   . Heart disease Brother 24       AFIB  . Suicidality Son 39       07/20/2013  . Thyroid disease Sister 50    Social History:  reports that she quit smoking about 12 years ago. Her smoking use included Cigarettes. She has a 14.00 pack-year smoking history. She has never used smokeless tobacco. She reports that she drinks alcohol. She reports that she does not use drugs.  Additional Social History:  Alcohol / Drug Use Pain Medications: denies Prescriptions: denies Over the Counter: denies History of alcohol / drug use?: No history of alcohol / drug abuse Longest period of sobriety (when/how long): denies Negative Consequences of Use:  (denies) Withdrawal Symptoms:  (denies)  CIWA: CIWA-Ar BP: (!) 149/90 Pulse Rate: 68 COWS:    PATIENT STRENGTHS: (choose at least two) Ability for insight Average or above average intelligence  Capable of independent living Communication skills Motivation for treatment/growth Supportive family/friends  Allergies:  Allergies  Allergen Reactions  . Nickel   . Vitamin D Analogs     itching    Home Medications:  (Not in a hospital admission)  OB/GYN Status:  Patient's last menstrual period was 10/14/2002 (approximate).  General Assessment Data Location of Assessment: [facility] TTS Assessment: In system Is this a Tele or Face-to-Face Assessment?: Tele Assessment Is this an Initial Assessment or a Re-assessment for this encounter?: Initial Assessment Marital status: Married Is patient pregnant?: No Pregnancy Status: No Living  Arrangements: Spouse/significant other Can pt return to current living arrangement?: Yes Admission Status: Voluntary Is patient capable of signing voluntary admission?: Yes Referral Source: MD Insurance type: [employer] [facility]  Medical Screening Exam ([place]) Medical Exam completed: No  Crisis Care Plan Living Arrangements: Spouse/significant other Name of Therapist: none  Education Status Is patient currently in school?: No  Risk to self with the past 6 months Suicidal Ideation: No Has patient been a risk to self within the past 6 months prior to admission? : No Suicidal Intent: No Has patient had any suicidal intent within the past 6 months prior to admission? : No Is patient at risk for suicide?: No Suicidal Plan?: No Has patient had any suicidal plan within the past 6 months prior to admission? : No Access to Means: No What has been your use of drugs/alcohol within the last 12 months?: denies Previous Attempts/Gestures: No Intentional Self Injurious Behavior: None Family Suicide History: Yes (son 59) Recent stressful life event(s):  (husband's health, her BP) Persecutory voices/beliefs?: No Depression: No Depression Symptoms: Feeling angry/irritable, Insomnia, Tearfulness Substance abuse history and/or treatment for substance abuse?: No Suicide prevention information given to non-admitted patients: Not applicable  Risk to Others within the past 6 months Homicidal Ideation: No Does patient have any lifetime risk of violence toward others beyond the six months prior to admission? : No Thoughts of Harm to Others: No Current Homicidal Intent: No Current Homicidal Plan: No Access to Homicidal Means: No History of harm to others?: No Assessment of Violence: None Noted Does patient have access to weapons?: Yes (Comment) (guns in home) Criminal Charges Pending?: No Does patient have a court date: No Is patient on probation?: No  Psychosis Hallucinations: None  noted Delusions: None noted  Mental Status Report Appearance/Hygiene: Unremarkable Eye Contact: Good Motor Activity: Unremarkable Speech: Logical/coherent Level of Consciousness: Alert Mood: Anxious Affect: Anxious Thought Processes: Coherent, Relevant Judgement: Unimpaired Orientation: Person, Place, Time, Situation, Appropriate for developmental age Obsessive Compulsive Thoughts/Behaviors: Moderate  Cognitive Functioning Concentration: Good Memory: Recent Intact, Remote Intact IQ: Average Insight: Fair Impulse Control: Fair Appetite: Poor Weight Loss: 6 Weight Gain: 0 Sleep: Decreased Total Hours of Sleep:  (4) Vegetative Symptoms: None  ADLScreening [facility] Assessment Services) Patient's cognitive ability adequate to safely complete daily activities?: Yes Patient able to express need for assistance with ADLs?: Yes Independently performs ADLs?: Yes (appropriate for developmental age)  Prior Inpatient Therapy Prior Inpatient Therapy: No  Prior Outpatient Therapy Prior Outpatient Therapy: No Does patient have an ACCT team?: No Does patient have Intensive In-House Services?  : No Does patient have [facility] services? : No Does patient have P4CC services?: No  ADL Screening (condition at time of admission) Patient's cognitive ability adequate to safely complete daily activities?: Yes Is the patient deaf or have difficulty hearing?: No Does the patient have difficulty seeing, even when wearing glasses/contacts?: No Does the patient have difficulty  concentrating, remembering, or making decisions?: No Patient able to express need for assistance with ADLs?: Yes Does the patient have difficulty dressing or bathing?: No Independently performs ADLs?: Yes (appropriate for developmental age) Does the patient have difficulty walking or climbing stairs?: No Weakness of Legs: None Weakness of Arms/Hands: None  Home Assistive Devices/Equipment Home Assistive Devices/Equipment:  None    Abuse/Neglect Assessment (Assessment to be complete while patient is alone) Physical Abuse: Denies Verbal Abuse: Denies Sexual Abuse: Denies Exploitation of patient/patient's resources: Denies Self-Neglect: Denies Values / Beliefs Cultural Requests During Hospitalization: None Spiritual Requests During Hospitalization: None   Advance Directives (For Healthcare) Does Patient Have a Medical Advance Directive?: No    Additional Information 1:1 In Past 12 Months?: No CIRT Risk: No Elopement Risk: No Does patient have medical clearance?: Yes     Disposition:  Disposition Initial Assessment Completed for this Encounter: Yes Disposition of Patient: Outpatient treatment  [facility] 05/26/2017 6:58 PM

## 2017-05-26 NOTE — ED Provider Notes (Addendum)
[facility] DEPT Provider Note   CSN: [id number] Arrival date & time: 05/26/17  1609     History   Chief Complaint No chief complaint on file.   HPI Destiny Elliott is a 67 y.o. female.  66 year old female presents with increased anxiety times several weeks. Patient has also been more upset due to the anniversary of her child being deceased. Denies responding to internal stimuli. She also endorses increased depression. She has had worsening insomnia is well or some perseverations about different somatic complaints. Denies any suicidal or homicidal ideations. Has used Xanax recently without relief. Denies any prior psychiatric hospitalizations. Was seen by her primary care Dr. today and sent here for further evaluation and management.      Past Medical History:  Diagnosis Date  . Abnormal Pap smear 1994   --CIN I  . Allergic rhinitis   . Dense breasts   . Endocervical polyp 01/1993   -benign  . Hypercholesterolemia   . Hypertension   . Osteopenia     There are no active problems to display for this patient.   Past Surgical History:  Procedure Laterality Date  . CERVICAL BIOPSY  W/ LOOP ELECTRODE EXCISION  03/1993   CIN I  . COLONOSCOPY  2016  . endocervical polyp  01-27-93   -benign    OB History    Gravida Para Term Preterm AB Living   2 2 2  0 0 2   SAB TAB Ectopic Multiple Live Births   0 0 0 0 2       Home Medications    Prior to Admission medications   Medication Sig Start Date End Date Taking? Authorizing Provider  atenolol (TENORMIN) 50 MG tablet Take 50 mg by mouth daily.     [provider]    Family History Family History  Problem Relation Age of Onset  . Stroke Father 34  . Kidney disease Father   . Hypertension Father   . Hypertension Sister 85  . Hyperlipidemia Sister   . COPD Sister   . Breast cancer Sister 29  . Thyroid disease Mother 62  . Heart failure Mother   . Heart disease Mother   . Hypertension Mother   . COPD  Mother   . Heart disease Brother 24       AFIB  . Suicidality Son 39       07/20/2013  . Thyroid disease Sister 64    Social History Social History  Substance Use Topics  . Smoking status: Former Smoker    Packs/day: 0.50    Years: 28.00    Types: Cigarettes    Quit date: 11/29/2004  . Smokeless tobacco: Never Used  . Alcohol use Yes     Comment: occ wine     Allergies   Nickel   Review of Systems Review of Systems  All other systems reviewed and are negative.    Physical Exam Updated Vital Signs LMP 10/14/2002 (Approximate)   Physical Exam  Constitutional: She is oriented to person, place, and time. She appears well-developed and well-nourished.  Non-toxic appearance. No distress.  HENT:  Head: Normocephalic and atraumatic.  Eyes: Pupils are equal, round, and reactive to light. Conjunctivae, EOM and lids are normal.  Neck: Normal range of motion. Neck supple. No tracheal deviation present. No thyroid mass present.  Cardiovascular: Normal rate, regular rhythm and normal heart sounds.  Exam reveals no gallop.   No murmur heard. Pulmonary/Chest: Effort normal and breath sounds normal. No stridor. No  respiratory distress. She has no decreased breath sounds. She has no wheezes. She has no rhonchi. She has no rales.  Abdominal: Soft. Normal appearance and bowel sounds are normal. She exhibits no distension. There is no tenderness. There is no rebound and no CVA tenderness.  Musculoskeletal: Normal range of motion. She exhibits no edema or tenderness.  Neurological: She is alert and oriented to person, place, and time. She has normal strength. No cranial nerve deficit or sensory deficit. GCS eye subscore is 4. GCS verbal subscore is 5. GCS motor subscore is 6.  Skin: Skin is warm and dry. No abrasion and no rash noted.  Psychiatric: Her speech is normal. Her mood appears anxious. She is withdrawn.  Nursing note and vitals reviewed.    ED Treatments / Results  Labs (all  labs ordered are listed, but only abnormal results are displayed) Labs Reviewed  ETHANOL  RAPID URINE DRUG SCREEN, HOSP PERFORMED  CBC WITH DIFFERENTIAL/PLATELET  COMPREHENSIVE METABOLIC PANEL    EKG  EKG Interpretation None       Radiology No results found.  Procedures Procedures (including critical care time)  Medications Ordered in ED Medications  acetaminophen (TYLENOL) tablet 650 mg (not administered)  ondansetron (ZOFRAN) tablet 4 mg (not administered)  atenolol (TENORMIN) tablet 50 mg (not administered)     Initial Impression / Assessment and Plan / ED Course  I have reviewed the triage vital signs and the nursing notes.  Pertinent labs & imaging results that were available during my care of the patient were reviewed by me and considered in my medical decision making (see chart for details).     Patient medically clear for psychiatric disposition  7:35 PM Patient seen by TTS and does not meet criteria for inpatient mission. Will be given resources to perform intensive outpatient therapy  Final Clinical Impressions(s) / ED Diagnoses   Final diagnoses:  None    New Prescriptions New Prescriptions   No medications on file     Lacretia Leigh, MD 05/26/17 1703    Lacretia Leigh, MD 05/26/17 Joen Laura

## 2017-05-26 NOTE — ED Notes (Signed)
Bed: [facility]4 Expected date:  Expected time:  Means of arrival:  Comments: 

## 2017-06-05 DIAGNOSIS — R69 Illness, unspecified: Secondary | ICD-10-CM | POA: Diagnosis not present

## 2017-06-07 DIAGNOSIS — R69 Illness, unspecified: Secondary | ICD-10-CM | POA: Diagnosis not present

## 2017-06-09 DIAGNOSIS — J309 Allergic rhinitis, unspecified: Secondary | ICD-10-CM | POA: Diagnosis not present

## 2017-06-09 DIAGNOSIS — J3089 Other allergic rhinitis: Secondary | ICD-10-CM | POA: Diagnosis not present

## 2017-06-09 DIAGNOSIS — J301 Allergic rhinitis due to pollen: Secondary | ICD-10-CM | POA: Diagnosis not present

## 2017-06-12 ENCOUNTER — Ambulatory Visit: Payer: Medicare HMO | Admitting: Cardiovascular Disease

## 2017-06-12 DIAGNOSIS — R69 Illness, unspecified: Secondary | ICD-10-CM | POA: Diagnosis not present

## 2017-06-13 DIAGNOSIS — R69 Illness, unspecified: Secondary | ICD-10-CM | POA: Diagnosis not present

## 2017-06-14 DIAGNOSIS — F410 Panic disorder [episodic paroxysmal anxiety] without agoraphobia: Secondary | ICD-10-CM | POA: Diagnosis not present

## 2017-06-14 DIAGNOSIS — R69 Illness, unspecified: Secondary | ICD-10-CM | POA: Diagnosis not present

## 2017-06-15 DIAGNOSIS — F331 Major depressive disorder, recurrent, moderate: Secondary | ICD-10-CM | POA: Diagnosis not present

## 2017-06-15 DIAGNOSIS — R69 Illness, unspecified: Secondary | ICD-10-CM | POA: Diagnosis not present

## 2017-06-20 DIAGNOSIS — F331 Major depressive disorder, recurrent, moderate: Secondary | ICD-10-CM | POA: Diagnosis not present

## 2017-06-20 DIAGNOSIS — R69 Illness, unspecified: Secondary | ICD-10-CM | POA: Diagnosis not present

## 2017-06-21 DIAGNOSIS — R69 Illness, unspecified: Secondary | ICD-10-CM | POA: Diagnosis not present

## 2017-06-21 DIAGNOSIS — F331 Major depressive disorder, recurrent, moderate: Secondary | ICD-10-CM | POA: Diagnosis not present

## 2017-06-27 DIAGNOSIS — R69 Illness, unspecified: Secondary | ICD-10-CM | POA: Diagnosis not present

## 2017-06-27 DIAGNOSIS — F331 Major depressive disorder, recurrent, moderate: Secondary | ICD-10-CM | POA: Diagnosis not present

## 2017-06-28 DIAGNOSIS — F331 Major depressive disorder, recurrent, moderate: Secondary | ICD-10-CM | POA: Diagnosis not present

## 2017-06-28 DIAGNOSIS — R69 Illness, unspecified: Secondary | ICD-10-CM | POA: Diagnosis not present

## 2017-06-29 DIAGNOSIS — Z2089 Contact with and (suspected) exposure to other communicable diseases: Secondary | ICD-10-CM | POA: Diagnosis not present

## 2017-07-03 DIAGNOSIS — F331 Major depressive disorder, recurrent, moderate: Secondary | ICD-10-CM | POA: Diagnosis not present

## 2017-07-03 DIAGNOSIS — R69 Illness, unspecified: Secondary | ICD-10-CM | POA: Diagnosis not present

## 2017-07-04 DIAGNOSIS — R69 Illness, unspecified: Secondary | ICD-10-CM | POA: Diagnosis not present

## 2017-07-04 DIAGNOSIS — F331 Major depressive disorder, recurrent, moderate: Secondary | ICD-10-CM | POA: Diagnosis not present

## 2017-07-06 DIAGNOSIS — F331 Major depressive disorder, recurrent, moderate: Secondary | ICD-10-CM | POA: Diagnosis not present

## 2017-07-06 DIAGNOSIS — Z634 Disappearance and death of family member: Secondary | ICD-10-CM | POA: Diagnosis not present

## 2017-07-06 DIAGNOSIS — R69 Illness, unspecified: Secondary | ICD-10-CM | POA: Diagnosis not present

## 2017-07-10 DIAGNOSIS — F331 Major depressive disorder, recurrent, moderate: Secondary | ICD-10-CM | POA: Diagnosis not present

## 2017-07-10 DIAGNOSIS — Z634 Disappearance and death of family member: Secondary | ICD-10-CM | POA: Diagnosis not present

## 2017-07-10 DIAGNOSIS — R69 Illness, unspecified: Secondary | ICD-10-CM | POA: Diagnosis not present

## 2017-07-11 DIAGNOSIS — F331 Major depressive disorder, recurrent, moderate: Secondary | ICD-10-CM | POA: Diagnosis not present

## 2017-07-11 DIAGNOSIS — Z634 Disappearance and death of family member: Secondary | ICD-10-CM | POA: Diagnosis not present

## 2017-07-11 DIAGNOSIS — R69 Illness, unspecified: Secondary | ICD-10-CM | POA: Diagnosis not present

## 2017-07-12 DIAGNOSIS — J343 Hypertrophy of nasal turbinates: Secondary | ICD-10-CM | POA: Diagnosis not present

## 2017-07-12 DIAGNOSIS — J310 Chronic rhinitis: Secondary | ICD-10-CM | POA: Diagnosis not present

## 2017-07-26 DIAGNOSIS — R69 Illness, unspecified: Secondary | ICD-10-CM | POA: Diagnosis not present

## 2017-07-26 DIAGNOSIS — Z634 Disappearance and death of family member: Secondary | ICD-10-CM | POA: Diagnosis not present

## 2017-07-26 DIAGNOSIS — F331 Major depressive disorder, recurrent, moderate: Secondary | ICD-10-CM | POA: Diagnosis not present

## 2017-07-31 DIAGNOSIS — F331 Major depressive disorder, recurrent, moderate: Secondary | ICD-10-CM | POA: Diagnosis not present

## 2017-07-31 DIAGNOSIS — Z634 Disappearance and death of family member: Secondary | ICD-10-CM | POA: Diagnosis not present

## 2017-07-31 DIAGNOSIS — R69 Illness, unspecified: Secondary | ICD-10-CM | POA: Diagnosis not present

## 2017-08-01 DIAGNOSIS — F331 Major depressive disorder, recurrent, moderate: Secondary | ICD-10-CM | POA: Diagnosis not present

## 2017-08-01 DIAGNOSIS — R69 Illness, unspecified: Secondary | ICD-10-CM | POA: Diagnosis not present

## 2017-08-01 DIAGNOSIS — Z634 Disappearance and death of family member: Secondary | ICD-10-CM | POA: Diagnosis not present

## 2017-08-07 DIAGNOSIS — D692 Other nonthrombocytopenic purpura: Secondary | ICD-10-CM | POA: Diagnosis not present

## 2017-08-09 DIAGNOSIS — J343 Hypertrophy of nasal turbinates: Secondary | ICD-10-CM | POA: Diagnosis not present

## 2017-08-09 DIAGNOSIS — J310 Chronic rhinitis: Secondary | ICD-10-CM | POA: Diagnosis not present

## 2017-08-14 DIAGNOSIS — R69 Illness, unspecified: Secondary | ICD-10-CM | POA: Diagnosis not present

## 2017-08-14 DIAGNOSIS — R110 Nausea: Secondary | ICD-10-CM | POA: Diagnosis not present

## 2017-08-14 DIAGNOSIS — Z79899 Other long term (current) drug therapy: Secondary | ICD-10-CM | POA: Diagnosis not present

## 2017-08-14 HISTORY — PX: TOOTH EXTRACTION: SUR596

## 2017-08-17 DIAGNOSIS — Z634 Disappearance and death of family member: Secondary | ICD-10-CM | POA: Diagnosis not present

## 2017-08-17 DIAGNOSIS — R69 Illness, unspecified: Secondary | ICD-10-CM | POA: Diagnosis not present

## 2017-08-17 DIAGNOSIS — F331 Major depressive disorder, recurrent, moderate: Secondary | ICD-10-CM | POA: Diagnosis not present

## 2017-08-21 DIAGNOSIS — F331 Major depressive disorder, recurrent, moderate: Secondary | ICD-10-CM | POA: Diagnosis not present

## 2017-08-21 DIAGNOSIS — R69 Illness, unspecified: Secondary | ICD-10-CM | POA: Diagnosis not present

## 2017-08-21 DIAGNOSIS — Z634 Disappearance and death of family member: Secondary | ICD-10-CM | POA: Diagnosis not present

## 2017-08-22 DIAGNOSIS — F331 Major depressive disorder, recurrent, moderate: Secondary | ICD-10-CM | POA: Diagnosis not present

## 2017-08-22 DIAGNOSIS — Z634 Disappearance and death of family member: Secondary | ICD-10-CM | POA: Diagnosis not present

## 2017-08-22 DIAGNOSIS — R69 Illness, unspecified: Secondary | ICD-10-CM | POA: Diagnosis not present

## 2017-08-23 DIAGNOSIS — J3489 Other specified disorders of nose and nasal sinuses: Secondary | ICD-10-CM | POA: Diagnosis not present

## 2017-08-23 DIAGNOSIS — F331 Major depressive disorder, recurrent, moderate: Secondary | ICD-10-CM | POA: Diagnosis not present

## 2017-08-23 DIAGNOSIS — R69 Illness, unspecified: Secondary | ICD-10-CM | POA: Diagnosis not present

## 2017-08-23 DIAGNOSIS — Z79899 Other long term (current) drug therapy: Secondary | ICD-10-CM | POA: Diagnosis not present

## 2017-08-23 DIAGNOSIS — Z87891 Personal history of nicotine dependence: Secondary | ICD-10-CM | POA: Diagnosis not present

## 2017-08-23 DIAGNOSIS — T50905A Adverse effect of unspecified drugs, medicaments and biological substances, initial encounter: Secondary | ICD-10-CM | POA: Diagnosis not present

## 2017-08-23 DIAGNOSIS — Z634 Disappearance and death of family member: Secondary | ICD-10-CM | POA: Diagnosis not present

## 2017-08-28 DIAGNOSIS — Z634 Disappearance and death of family member: Secondary | ICD-10-CM | POA: Diagnosis not present

## 2017-08-28 DIAGNOSIS — R69 Illness, unspecified: Secondary | ICD-10-CM | POA: Diagnosis not present

## 2017-08-28 DIAGNOSIS — F331 Major depressive disorder, recurrent, moderate: Secondary | ICD-10-CM | POA: Diagnosis not present

## 2017-08-30 DIAGNOSIS — R69 Illness, unspecified: Secondary | ICD-10-CM | POA: Diagnosis not present

## 2017-08-30 DIAGNOSIS — F331 Major depressive disorder, recurrent, moderate: Secondary | ICD-10-CM | POA: Diagnosis not present

## 2017-08-30 DIAGNOSIS — Z634 Disappearance and death of family member: Secondary | ICD-10-CM | POA: Diagnosis not present

## 2017-09-04 ENCOUNTER — Encounter (HOSPITAL_COMMUNITY): Payer: Self-pay

## 2017-09-04 ENCOUNTER — Emergency Department (HOSPITAL_COMMUNITY)
Admission: EM | Admit: 2017-09-04 | Discharge: 2017-09-04 | Disposition: A | Discharge status: Home / Self Care | Payer: Medicare HMO | Attending: Emergency Medicine | Admitting: Emergency Medicine

## 2017-09-04 ENCOUNTER — Emergency Department (HOSPITAL_COMMUNITY): Payer: Medicare HMO

## 2017-09-04 DIAGNOSIS — R270 Ataxia, unspecified: Secondary | ICD-10-CM | POA: Diagnosis not present

## 2017-09-04 DIAGNOSIS — R42 Dizziness and giddiness: Secondary | ICD-10-CM

## 2017-09-04 DIAGNOSIS — Z79899 Other long term (current) drug therapy: Secondary | ICD-10-CM | POA: Insufficient documentation

## 2017-09-04 DIAGNOSIS — I10 Essential (primary) hypertension: Secondary | ICD-10-CM | POA: Insufficient documentation

## 2017-09-04 DIAGNOSIS — H539 Unspecified visual disturbance: Secondary | ICD-10-CM | POA: Insufficient documentation

## 2017-09-04 DIAGNOSIS — Z87891 Personal history of nicotine dependence: Secondary | ICD-10-CM | POA: Diagnosis not present

## 2017-09-04 HISTORY — DX: Anxiety disorder, unspecified: F41.9

## 2017-09-04 LAB — URINALYSIS, ROUTINE W REFLEX MICROSCOPIC
Bilirubin Urine: NEGATIVE
Glucose, UA: NEGATIVE mg/dL
Hgb urine dipstick: NEGATIVE
Ketones, ur: NEGATIVE mg/dL
Leukocytes, UA: NEGATIVE
Nitrite: NEGATIVE
Protein, ur: NEGATIVE mg/dL
Specific Gravity, Urine: 1.001 — ABNORMAL LOW (ref 1.005–1.030)
pH: 7 (ref 5.0–8.0)

## 2017-09-04 LAB — COMPREHENSIVE METABOLIC PANEL
ALT: 22 U/L (ref 14–54)
AST: 33 U/L (ref 15–41)
Albumin: 3.8 g/dL (ref 3.5–5.0)
Alkaline Phosphatase: 86 U/L (ref 38–126)
Anion gap: 6 (ref 5–15)
BUN: 8 mg/dL (ref 6–20)
CO2: 29 mmol/L (ref 22–32)
Calcium: 9 mg/dL (ref 8.9–10.3)
Chloride: 98 mmol/L — ABNORMAL LOW (ref 101–111)
Creatinine, Ser: 0.67 mg/dL (ref 0.44–1.00)
GFR calc Af Amer: >60 mL/min (ref >60)
GFR calc non Af Amer: >60 mL/min (ref >60)
Glucose, Bld: 92 mg/dL (ref 65–99)
Potassium: 4.1 mmol/L (ref 3.5–5.1)
Sodium: 133 mmol/L — ABNORMAL LOW (ref 135–145)
Total Bilirubin: 0.5 mg/dL (ref 0.3–1.2)
Total Protein: 6.7 g/dL (ref 6.5–8.1)

## 2017-09-04 LAB — CBC WITH DIFFERENTIAL/PLATELET
Basophils Absolute: 0 10*3/uL (ref 0.0–0.1)
Basophils Relative: 0 %
Eosinophils Absolute: 0 10*3/uL (ref 0.0–0.7)
Eosinophils Relative: 0 %
HCT: 38.6 % (ref 36.0–46.0)
Hemoglobin: 12.7 g/dL (ref 12.0–15.0)
Lymphocytes Relative: 14 %
Lymphs Abs: 1 10*3/uL (ref 0.7–4.0)
MCH: 30 pg (ref 26.0–34.0)
MCHC: 32.9 g/dL (ref 30.0–36.0)
MCV: 91 fL (ref 78.0–100.0)
Monocytes Absolute: 0.8 10*3/uL (ref 0.1–1.0)
Monocytes Relative: 11 %
Neutro Abs: 5.4 10*3/uL (ref 1.7–7.7)
Neutrophils Relative %: 75 %
Platelets: 342 10*3/uL (ref 150–400)
RBC: 4.24 MIL/uL (ref 3.87–5.11)
RDW: 13.7 % (ref 11.5–15.5)
WBC: 7.3 10*3/uL (ref 4.0–10.5)

## 2017-09-04 LAB — RAPID URINE DRUG SCREEN, HOSP PERFORMED
Amphetamines: NOT DETECTED
Barbiturates: NOT DETECTED
Benzodiazepines: NOT DETECTED
Cocaine: NOT DETECTED
Opiates: NOT DETECTED
Tetrahydrocannabinol: NOT DETECTED

## 2017-09-04 LAB — TSH: TSH: 1.911 u[IU]/mL (ref 0.350–4.500)

## 2017-09-04 LAB — I-STAT TROPONIN, ED: Troponin i, poc: 0.01 ng/mL (ref 0.00–0.08)

## 2017-09-04 MED ORDER — LORAZEPAM 2 MG/ML IJ SOLN
1.0000 mg | Freq: Once | INTRAMUSCULAR | Status: AC
  Administered 2017-09-04: 1 mg via INTRAMUSCULAR
  Filled 2017-09-04: qty 1

## 2017-09-04 NOTE — ED Triage Notes (Signed)
Pt arrives ambulatory from home with husband with c/o double and blurred vision with feeling of "disorientation" Describes feeling as"feet not firmly planted"Alert and oriented x 4. MAEW

## 2017-09-04 NOTE — ED Notes (Signed)
Called to MRI as pt now feels she requires medication to complete test.

## 2017-09-04 NOTE — Discharge Instructions (Signed)
1.  Your symptoms might be due to Zoloft and\or Tylenol PM.  Discontinue these medications at this time and see your doctor to determine whether or not you may resume them. 2.  Due to your symptoms you should follow-up with a neurologist as well.  Schedule appointment as soon as possible.

## 2017-09-04 NOTE — ED Provider Notes (Signed)
[facility] EMERGENCY DEPARTMENT Provider Note   CSN: [id number] Arrival date & time: 09/04/17  9485     History   Chief Complaint Chief Complaint  Patient presents with  . Visual Field Change  . Altered Mental Status    HPI Destiny Elliott is a 66 y.o. female.  HPI Patient reports he awakened during the night at about 3 AM and felt very disoriented and off balance.  She reports she has had several episodes of double vision over the past couple of days.  It lasts for a few minutes at a time.  Last night she had periods of feeling disoriented.  No focal weakness numbness or tingling of any extremity.  No recent fever chills.  No chest pain.  She reports a few times as the episodes were happening she felt that it was somewhat difficult to breathe.  She tried splashing water on her face and resting.  She reports this morning when she awakened she still had a vague disoriented feeling and episodes of intermittent double vision.  At that time she determined to seek treatment.  Patient has had new medication, she has started Zoloft within the past week.  She reports she does sometimes take Tylenol PM to help with sleep, and did take a Tylenol PM at about 3 AM. Past Medical History:  Diagnosis Date  . Abnormal Pap smear 1994   --CIN I  . Allergic rhinitis   . Anxiety   . Dense breasts   . Endocervical polyp 01/1993   -benign  . Hypercholesterolemia   . Hypertension   . Osteopenia     There are no active problems to display for this patient.   Past Surgical History:  Procedure Laterality Date  . CERVICAL BIOPSY  W/ LOOP ELECTRODE EXCISION  03/1993   CIN I  . COLONOSCOPY  2016  . endocervical polyp  01-27-93   -benign    OB History    Gravida Para Term Preterm AB Living   2 2 2  0 0 2   SAB TAB Ectopic Multiple Live Births   0 0 0 0 2       Home Medications    Prior to Admission medications   Medication Sig Start Date End Date Taking? Authorizing  Provider  ALPRAZolam (XANAX) 0.25 MG tablet Take 0.25 mg by mouth 2 (two) times daily as needed for anxiety or sleep.  05/08/17   [provider]  atenolol (TENORMIN) 50 MG tablet Take 50 mg by mouth daily.     [provider]  sodium chloride (OCEAN) 0.65 % SOLN nasal spray Place 1 spray into both nostrils daily as needed for congestion.    [provider]    Family History Family History  Problem Relation Age of Onset  . Stroke Father 21  . Kidney disease Father   . Hypertension Father   . Hypertension Sister 36  . Hyperlipidemia Sister   . COPD Sister   . Breast cancer Sister 71  . Thyroid disease Mother 12  . Heart failure Mother   . Heart disease Mother   . Hypertension Mother   . COPD Mother   . Heart disease Brother 25       AFIB  . Suicidality Son 39       07/20/2013  . Thyroid disease Sister 75    Social History Social History  Substance Use Topics  . Smoking status: Former Smoker    Packs/day: 0.50  Years: 28.00    Types: Cigarettes    Quit date: 11/29/2004  . Smokeless tobacco: Never Used  . Alcohol use Yes     Comment: occ wine     Allergies   Nickel and Vitamin d analogs   Review of Systems Review of Systems 10 Systems reviewed and are negative for acute change except as noted in the HPI.   Physical Exam Updated Vital Signs BP (!) 191/92   Pulse (!) 59   Temp (!) 97.5 F (36.4 C) (Oral)   Resp 20   Ht 5\' 2"  (1.575 m)   Wt 59 kg (130 lb)   LMP 10/14/2002 (Approximate)   SpO2 93%   BMI 23.78 kg/m   Physical Exam  Constitutional: She is oriented to person, place, and time. She appears well-developed and well-nourished. No distress.  HENT:  Head: Normocephalic and atraumatic.  Bilateral TMs normal.  His membranes pink and moist.  Eyes: Pupils are equal, round, and reactive to light. Conjunctivae and EOM are normal.  Neck: Neck supple.  Cardiovascular: Normal rate, regular rhythm, normal heart sounds and intact  distal pulses.   No murmur heard. Pulmonary/Chest: Effort normal and breath sounds normal. No respiratory distress.  Abdominal: Soft. There is no tenderness.  Musculoskeletal: Normal range of motion. She exhibits no edema or tenderness.  Neurological: She is alert and oriented to person, place, and time. No cranial nerve deficit or sensory deficit. She exhibits normal muscle tone. Coordination normal.  Cognitive function normal.  Finger nose exam normal.  Heel-to-shin exam normal.  Skin: Skin is warm and dry.  Psychiatric: She has a normal mood and affect.  Nursing note and vitals reviewed.    ED Treatments / Results  Labs (all labs ordered are listed, but only abnormal results are displayed) Labs Reviewed  COMPREHENSIVE METABOLIC PANEL - Abnormal; Notable for the following:       Result Value   Sodium 133 (*)    Chloride 98 (*)    All other components within normal limits  URINALYSIS, ROUTINE W REFLEX MICROSCOPIC - Abnormal; Notable for the following:    Color, Urine COLORLESS (*)    Specific Gravity, Urine 1.001 (*)    All other components within normal limits  CBC WITH DIFFERENTIAL/PLATELET  RAPID URINE DRUG SCREEN, HOSP PERFORMED  TSH  I-STAT TROPONIN, ED    EKG  EKG Interpretation Muse as we will not open for EKG interpretation Sinus rhythm 80 PR 74 QRS 86 QTC 468 No acute ischemic appearance short PR.  No old comparison EKG       Radiology Mr Brain Wo Contrast (neuro Protocol)  Result Date: 09/04/2017 CLINICAL DATA:  Ataxia.  Possible stroke. EXAM: MRI HEAD WITHOUT CONTRAST TECHNIQUE: Multiplanar, multiecho pulse sequences of the brain and surrounding structures were obtained without intravenous contrast. COMPARISON:  None. FINDINGS: Brain: Diffusion imaging does not show any acute or subacute infarction. No abnormality is seen affecting the brainstem or cerebellum. Within the cerebral hemispheres, there are widespread chronic appearing areas of abnormal T2 and  FLAIR signal within the deep and subcortical white matter, most prominent in the forceps major regions. Usually, this is an indication of chronic small vessel ischemic change. The differential diagnosis is that of demyelinating disease/ multiple sclerosis. No large vessel territory or cortical insult. No intra-axial mass lesion, hemorrhage, hydrocephalus or subdural collection. There is a 2 cm incidental arachnoid cyst at the left CP angle region. Vascular: Major vessels at the base of the brain show flow. Skull  and upper cervical spine: Negative Sinuses/Orbits: Clear/normal Other: None IMPRESSION: No acute or subacute insult by MRI. 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 The differential diagnosis in this case does include demyelinating disease/ multiple sclerosis. Electronically Signed   By: Nelson Chimes M.D.   On: 09/04/2017 11:21    Procedures Procedures (including critical care time)  Medications Ordered in ED Medications  LORazepam (ATIVAN) injection 1 mg (1 mg Intramuscular Given 09/04/17 1028)     Initial Impression / Assessment and Plan / ED Course  I have reviewed the triage vital signs and the nursing notes.  Pertinent labs & imaging results that were available during my care of the patient were reviewed by me and considered in my medical decision making (see chart for details).    Consult: Reviewed and neurology Dr. Leonel Ramsay.  MRI does not appear highly suspicious for MS.  No acute stroke findings.  No other acute findings that are likely to explain the patient's symptoms.  Plan to follow-up as an outpatient for further neurologic evaluation.  Final Clinical Impressions(s) / ED Diagnoses   Final diagnoses:  Visual disturbance  Dizziness  Patient is alert and nontoxic.  She does not have focal neurologic deficit on examination.  She did report double vision several times over the past few weeks.  MRI does not show acute  findings that would be responsible.  I most suspect adverse medication reaction to Zoloft which is a new medication.  Patient also is periodically taking Tylenol PM which seems to bring the symptoms on.  Which she will be counseled on discontinuing these medications temporarily until she continues to see her physician for follow-up and outpatient neurology follow-up.  New Prescriptions New Prescriptions   No medications on file     Charlesetta Shanks, MD 09/04/17 1320

## 2017-09-05 DIAGNOSIS — F331 Major depressive disorder, recurrent, moderate: Secondary | ICD-10-CM | POA: Diagnosis not present

## 2017-09-05 DIAGNOSIS — Z634 Disappearance and death of family member: Secondary | ICD-10-CM | POA: Diagnosis not present

## 2017-09-05 DIAGNOSIS — R69 Illness, unspecified: Secondary | ICD-10-CM | POA: Diagnosis not present

## 2017-09-07 DIAGNOSIS — R69 Illness, unspecified: Secondary | ICD-10-CM | POA: Diagnosis not present

## 2017-09-07 DIAGNOSIS — F411 Generalized anxiety disorder: Secondary | ICD-10-CM | POA: Diagnosis not present

## 2017-09-08 ENCOUNTER — Encounter: Payer: Self-pay | Admitting: Diagnostic Neuroimaging

## 2017-09-08 ENCOUNTER — Ambulatory Visit (INDEPENDENT_AMBULATORY_CARE_PROVIDER_SITE_OTHER): Payer: Medicare HMO | Admitting: Diagnostic Neuroimaging

## 2017-09-08 VITALS — BP 154/90 | HR 85 | Ht 62.0 in | Wt 136.2 lb

## 2017-09-08 DIAGNOSIS — F411 Generalized anxiety disorder: Secondary | ICD-10-CM

## 2017-09-08 DIAGNOSIS — R682 Dry mouth, unspecified: Secondary | ICD-10-CM

## 2017-09-08 DIAGNOSIS — H532 Diplopia: Secondary | ICD-10-CM

## 2017-09-08 DIAGNOSIS — R42 Dizziness and giddiness: Secondary | ICD-10-CM

## 2017-09-08 DIAGNOSIS — R69 Illness, unspecified: Secondary | ICD-10-CM | POA: Diagnosis not present

## 2017-09-08 NOTE — Patient Instructions (Signed)
Thank you for coming to see [place] at [facility]. I hope we have been able to provide you high quality care today.  You may receive a patient satisfaction survey over the next few weeks. We would appreciate your feedback and comments so that we may continue to improve ourselves and the health of our patients.  - follow up with psychiatry   ~~~~~~~~~~~~~~~~~~~~~~~~~~~~~~~~~~~~~~~~~~~~~~~~~~~~~~~~~~~~~~~~~  DR. Malik Ruffino'S GUIDE TO HAPPY AND HEALTHY LIVING These are some of my general health and wellness recommendations. Some of them may apply to you better than others. Please use common sense as you try these suggestions and feel free to ask me any questions.   ACTIVITY/FITNESS Mental, social, emotional and physical stimulation are very important for brain and body health. Try learning a new activity (arts, music, language, sports, games).  Keep moving your body to the best of your abilities. You can do this at home, inside or outside, the park, community center, gym or anywhere you like. Consider a physical therapist or personal trainer to get started. Consider the app Sworkit. Fitness trackers such as smart-watches, smart-phones or Fitbits can help as well.   NUTRITION Eat more plants: colorful vegetables, nuts, seeds and berries.  Eat less sugar, salt, preservatives and processed foods.  Avoid toxins such as cigarettes and alcohol.  Drink water when you are thirsty. Warm water with a slice of lemon is an excellent morning drink to start the day.  Consider these websites for more information The Nutrition Source (https://www.henry-hernandez.biz/) [employer] Nutrition (WindowBlog.ch)   RELAXATION Consider practicing mindfulness meditation or other relaxation techniques such as deep breathing, prayer, yoga, tai chi, massage. See website mindful.org or the apps Headspace or Calm to help get started.   SLEEP Try to get at  least 7-8+ hours sleep per day. Regular exercise and reduced caffeine will help you sleep better. Practice good sleep hygeine techniques. See website sleep.org for more information.   PLANNING Prepare estate planning, living will, healthcare POA documents. Sometimes this is best planned with the help of an attorney. Theconversationproject.org and agingwithdignity.org are excellent resources.

## 2017-09-08 NOTE — Progress Notes (Signed)
[facility]  PATIENT: Destiny Elliott DOB: 06/25/1951  REFERRING CLINICIAN: Pfeiffer, M HISTORY FROM: patient, husband, daughter, chart review REASON FOR VISIT: new consult    HISTORICAL  CHIEF COMPLAINT:  Chief Complaint  Patient presents with  . Visual Field Change    rm 7, husb- Todd, dgtrMagda Paganini, " woke up Mon night after taking Tylenol PM with blurry vision; vision changes over past month"    HISTORY OF PRESENT ILLNESS:   66 year old female here for evaluation of dry mouth, dizziness, anxiety, double vision, medication side effect.  September 2014 patient's son committed suicide.  Patient had significant grieving and subsequent anxiety problems.  She was treated intermittently with Xanax.  She also saw a therapist.  Patient was stable for several years.  In March 2018 patient started to have increasing problems with anxiety, insomnia, and started to see a therapist and counselor.  In July she was started on Paxil but this caused agitation and GI side effects.  She was also tried on Remeron and gabapentin, but without relief.  August 28, 2017 patient was tried on low-dose Zoloft, but patient continues to have concerns about possible side effects.  On 09/04/17, at 12:30 in the morning patient took 2 Tylenol PM to try to go to sleep.  However she had immediate dry mouth sensation, then began to have shortness of breath, gasping for air, flushing sensation in her face and body.  She was able to calm herself down and go to sleep.  When she woke up she still felt distraught.  Therefore they went to the emergency room for evaluation.  Patient was complaining of double vision, dizziness, and other side effects.  Patient had MRI of the brain which showed no acute findings.  Patient was recommended follow-up in neurology clinic.  Patient continues to have dry mouth, anxiety, restless sleep.  Yesterday she had scalp tingling sensation.  She is seeing a Social worker at  [facility].  Patient and family are concerned about medication management versus other options to treat anxiety.   REVIEW OF SYSTEMS: Full 14 system review of systems performed and negative with exception of: Only as per HPI.  History of hypertension.  History of smoking.  ALLERGIES: Allergies  Allergen Reactions  . Nickel Itching  . Vitamin D Analogs     Itching, pt denies, doesn't take Vit D supplements    HOME MEDICATIONS: Outpatient Medications Prior to Visit  Medication Sig Dispense Refill  . atenolol (TENORMIN) 50 MG tablet Take 50 mg by mouth daily.     . sodium chloride (OCEAN) 0.65 % SOLN nasal spray Place 1 spray into both nostrils daily as needed for congestion.    Marland Kitchen ALPRAZolam (XANAX) 0.25 MG tablet Take 0.25 mg by mouth 2 (two) times daily as needed for anxiety or sleep.      No facility-administered medications prior to visit.     PAST MEDICAL HISTORY: Past Medical History:  Diagnosis Date  . Abnormal Pap smear 1994   --CIN I  . Allergic rhinitis   . Anxiety   . Dense breasts   . Endocervical polyp 01/1993   -benign  . Hypercholesterolemia   . Hypertension   . Osteopenia     PAST SURGICAL HISTORY: Past Surgical History:  Procedure Laterality Date  . CERVICAL BIOPSY  W/ LOOP ELECTRODE EXCISION  03/1993   CIN I  . COLONOSCOPY  2016  . endocervical polyp  01-27-93   -benign  . TOOTH EXTRACTION  08/2017  FAMILY HISTORY: Family History  Problem Relation Age of Onset  . Stroke Father 4  . Kidney disease Father   . Hypertension Father   . Hypertension Sister 35  . Hyperlipidemia Sister   . COPD Sister   . Breast cancer Sister 56  . Thyroid disease Mother 67  . Heart failure Mother   . Heart disease Mother   . Hypertension Mother   . COPD Mother   . Heart disease Brother 32       AFIB  . Suicidality Son 39       07/20/2013  . Thyroid disease Sister 51    SOCIAL HISTORY:  Social History   Social History  . Marital status: Married     Spouse name: Sherren Mocha  . Number of children: 2  . Years of education: 12   Occupational History  . [employer]     self employed   Social History Main Topics  . Smoking status: Former Smoker    Packs/day: 0.50    Years: 28.00    Types: Cigarettes    Quit date: 07/01/2004  . Smokeless tobacco: Never Used  . Alcohol use Yes     Comment: occ wine  . Drug use: No  . Sexual activity: Yes    Partners: Male    Birth control/ protection: Post-menopausal   Other Topics Concern  . Not on file   Social History Narrative   Lives with Sherren Mocha, one child deceased   Caffeine- 2 cups of tea     PHYSICAL EXAM  GENERAL EXAM/CONSTITUTIONAL: Vitals:  Vitals:   09/08/17 0819  BP: (!) 154/90  Pulse: 85  Weight: 136 lb 3.2 oz (61.8 kg)  Height: 5\' 2"  (1.575 m)     Body mass index is 24.91 kg/m.  Visual Acuity Screening   Right eye Left eye Both eyes  Without correction: 20/200 20/200   With correction: 20/30 20/40   Comments: Wore readers for exam with glasses, prescription glasses at home    Patient is in no distress; well developed, nourished and groomed; neck is supple  CARDIOVASCULAR:  Examination of carotid arteries is normal; no carotid bruits  Regular rate and rhythm, no murmurs  Examination of peripheral vascular system by observation and palpation is normal  EYES:  Ophthalmoscopic exam of optic discs and posterior segments is normal; no papilledema or hemorrhages  MUSCULOSKELETAL:  Gait, strength, tone, movements noted in Neurologic exam below  NEUROLOGIC: MENTAL STATUS:  No flowsheet data found.  awake, alert, oriented to person, place and time  recent and remote memory intact  normal attention and concentration  language fluent, comprehension intact, naming intact,   fund of knowledge appropriate  CRANIAL NERVE:   2nd - no papilledema on fundoscopic exam  2nd, 3rd, 4th, 6th - pupils equal and reactive to light, visual fields full to  confrontation, extraocular muscles intact, no nystagmus  5th - facial sensation symmetric  7th - facial strength symmetric  8th - hearing intact  9th - palate elevates symmetrically, uvula midline  11th - shoulder shrug symmetric  12th - tongue protrusion midline  MOTOR:   normal bulk and tone, full strength in the BUE, BLE  SENSORY:   normal and symmetric to light touch, pinprick, temperature, vibration  COORDINATION:   finger-nose-finger, fine finger movements normal  REFLEXES:   deep tendon reflexes present and symmetric  GAIT/STATION:   narrow based gait; able to walk on toes, heels and tandem; romberg is negative    DIAGNOSTIC  DATA (LABS, IMAGING, TESTING) - I reviewed patient records, labs, notes, testing and imaging myself where available.  Lab Results  Component Value Date   WBC 7.3 09/04/2017   HGB 12.7 09/04/2017   HCT 38.6 09/04/2017   MCV 91.0 09/04/2017   PLT 342 09/04/2017      Component Value Date/Time   NA 133 (L) 09/04/2017 0934   K 4.1 09/04/2017 0934   CL 98 (L) 09/04/2017 0934   CO2 29 09/04/2017 0934   GLUCOSE 92 09/04/2017 0934   BUN 8 09/04/2017 0934   CREATININE 0.67 09/04/2017 0934   CALCIUM 9.0 09/04/2017 0934   PROT 6.7 09/04/2017 0934   ALBUMIN 3.8 09/04/2017 0934   AST 33 09/04/2017 0934   ALT 22 09/04/2017 0934   ALKPHOS 86 09/04/2017 0934   BILITOT 0.5 09/04/2017 0934   GFRNONAA >60 09/04/2017 0934   GFRAA >60 09/04/2017 0934   No results found for: CHOL, HDL, LDLCALC, LDLDIRECT, TRIG, CHOLHDL No results found for: HGBA1C Lab Results  Component Value Date   YHCWCBJS28 315 12/02/2014   Lab Results  Component Value Date   TSH 1.911 09/04/2017     09/04/17 MRI brain [I reviewed images myself and agree with interpretation, except I think the white matter changes are likely chronic small vessel ischemic disease. -VRP]  - No acute or subacute insult by MRI. - Moderate hemispheric white matter disease, usually  secondary to small vessel ischemic change. The differential diagnosis in this case does include demyelinating disease/ multiple sclerosis.      ASSESSMENT AND PLAN  66 y.o. year old female here with significant anxiety, with recent episode of double vision, dizziness, dry mouth, shortness of breath and panic sensations, likely related to underlying anxiety disorder and possible medication side effect from Tylenol PM.  I spent a long time reassuring patient that I do not think she has a serious underlying neurological problem.  I think her symptoms are more related to anxiety disorder and medication side effect.  We discussed options for treating her anxiety more productively.  She is requesting a referral to a psychiatrist for further evaluation and management and I will try to set this up.   Dx:  1. Generalized anxiety disorder   2. Dizziness   3. Diplopia   4. Dry mouth      PLAN: - will refer to psychiatry for anxiety treatment  Orders Placed This Encounter  Procedures  . Ambulatory referral to Psychiatry   Return if symptoms worsen or fail to improve, for return to PCP and psychiatry.    Penni Bombard, MD 17/61/6073, 7:10 AM Certified in Neurology, Neurophysiology and Neuroimaging  [facility] [address] [place], [place] [address] [phone]

## 2017-09-11 DIAGNOSIS — F411 Generalized anxiety disorder: Secondary | ICD-10-CM | POA: Diagnosis not present

## 2017-09-11 DIAGNOSIS — R69 Illness, unspecified: Secondary | ICD-10-CM | POA: Diagnosis not present

## 2017-09-14 DIAGNOSIS — R69 Illness, unspecified: Secondary | ICD-10-CM | POA: Diagnosis not present

## 2017-09-16 DIAGNOSIS — R69 Illness, unspecified: Secondary | ICD-10-CM | POA: Diagnosis not present

## 2017-09-16 DIAGNOSIS — F411 Generalized anxiety disorder: Secondary | ICD-10-CM | POA: Diagnosis not present

## 2017-09-19 ENCOUNTER — Telehealth: Payer: Self-pay | Admitting: Diagnostic Neuroimaging

## 2017-09-19 DIAGNOSIS — F411 Generalized anxiety disorder: Secondary | ICD-10-CM | POA: Diagnosis not present

## 2017-09-19 DIAGNOSIS — R69 Illness, unspecified: Secondary | ICD-10-CM | POA: Diagnosis not present

## 2017-09-19 NOTE — Telephone Encounter (Signed)
Pt is calling stating that she has additional questions to f/u to her last appointment with Dr Leta Baptist concerning her MRI done in the ER and she is asking for a call from him when he is available.

## 2017-09-20 DIAGNOSIS — I10 Essential (primary) hypertension: Secondary | ICD-10-CM | POA: Diagnosis not present

## 2017-09-20 DIAGNOSIS — R69 Illness, unspecified: Secondary | ICD-10-CM | POA: Diagnosis not present

## 2017-09-20 DIAGNOSIS — J3489 Other specified disorders of nose and nasal sinuses: Secondary | ICD-10-CM | POA: Diagnosis not present

## 2017-09-20 DIAGNOSIS — Z79899 Other long term (current) drug therapy: Secondary | ICD-10-CM | POA: Diagnosis not present

## 2017-09-20 DIAGNOSIS — J45909 Unspecified asthma, uncomplicated: Secondary | ICD-10-CM | POA: Diagnosis not present

## 2017-09-20 DIAGNOSIS — Z87891 Personal history of nicotine dependence: Secondary | ICD-10-CM | POA: Diagnosis not present

## 2017-09-20 NOTE — Telephone Encounter (Signed)
Pt called back, I assured her the msg was sent to the provider and he would call when he is available to talk. Please call to discuss  Thank you

## 2017-09-22 NOTE — Telephone Encounter (Signed)
I called patient x 2. No answer. -VRP

## 2017-09-25 ENCOUNTER — Encounter (HOSPITAL_COMMUNITY): Payer: Self-pay

## 2017-09-25 ENCOUNTER — Other Ambulatory Visit: Payer: Self-pay

## 2017-09-25 ENCOUNTER — Emergency Department (HOSPITAL_COMMUNITY)
Admission: EM | Admit: 2017-09-25 | Discharge: 2017-09-25 | Disposition: A | Discharge status: Other Acute Inpatient Hospital | Payer: Medicare HMO | Attending: Emergency Medicine | Admitting: Emergency Medicine

## 2017-09-25 DIAGNOSIS — R69 Illness, unspecified: Secondary | ICD-10-CM | POA: Diagnosis not present

## 2017-09-25 DIAGNOSIS — R45851 Suicidal ideations: Secondary | ICD-10-CM

## 2017-09-25 DIAGNOSIS — F4320 Adjustment disorder, unspecified: Secondary | ICD-10-CM | POA: Insufficient documentation

## 2017-09-25 DIAGNOSIS — Z79899 Other long term (current) drug therapy: Secondary | ICD-10-CM | POA: Diagnosis not present

## 2017-09-25 DIAGNOSIS — Z87891 Personal history of nicotine dependence: Secondary | ICD-10-CM | POA: Insufficient documentation

## 2017-09-25 DIAGNOSIS — I10 Essential (primary) hypertension: Secondary | ICD-10-CM | POA: Insufficient documentation

## 2017-09-25 DIAGNOSIS — F329 Major depressive disorder, single episode, unspecified: Secondary | ICD-10-CM | POA: Diagnosis present

## 2017-09-25 LAB — CBC
HCT: 42.7 % (ref 36.0–46.0)
Hemoglobin: 14.1 g/dL (ref 12.0–15.0)
MCH: 30.7 pg (ref 26.0–34.0)
MCHC: 33 g/dL (ref 30.0–36.0)
MCV: 93 fL (ref 78.0–100.0)
Platelets: 379 10*3/uL (ref 150–400)
RBC: 4.59 MIL/uL (ref 3.87–5.11)
RDW: 13.3 % (ref 11.5–15.5)
WBC: 10.2 10*3/uL (ref 4.0–10.5)

## 2017-09-25 LAB — RAPID URINE DRUG SCREEN, HOSP PERFORMED
Amphetamines: NOT DETECTED
Barbiturates: NOT DETECTED
Benzodiazepines: NOT DETECTED
Cocaine: NOT DETECTED
Opiates: NOT DETECTED
Tetrahydrocannabinol: NOT DETECTED

## 2017-09-25 LAB — COMPREHENSIVE METABOLIC PANEL
ALT: 21 U/L (ref 14–54)
AST: 35 U/L (ref 15–41)
Albumin: 4.8 g/dL (ref 3.5–5.0)
Alkaline Phosphatase: 104 U/L (ref 38–126)
Anion gap: 10 (ref 5–15)
BUN: 12 mg/dL (ref 6–20)
CO2: 26 mmol/L (ref 22–32)
Calcium: 9.7 mg/dL (ref 8.9–10.3)
Chloride: 105 mmol/L (ref 101–111)
Creatinine, Ser: 0.68 mg/dL (ref 0.44–1.00)
GFR calc Af Amer: >60 mL/min (ref >60)
GFR calc non Af Amer: >60 mL/min (ref >60)
Glucose, Bld: 97 mg/dL (ref 65–99)
Potassium: 3.7 mmol/L (ref 3.5–5.1)
Sodium: 141 mmol/L (ref 135–145)
Total Bilirubin: 0.4 mg/dL (ref 0.3–1.2)
Total Protein: 8.6 g/dL — ABNORMAL HIGH (ref 6.5–8.1)

## 2017-09-25 LAB — SALICYLATE LEVEL: Salicylate Lvl: <7 mg/dL (ref 2.8–30.0)

## 2017-09-25 LAB — ETHANOL: Alcohol, Ethyl (B): <10 mg/dL (ref <10)

## 2017-09-25 LAB — ACETAMINOPHEN LEVEL: Acetaminophen (Tylenol), Serum: <10 ug/mL — ABNORMAL LOW (ref 10–30)

## 2017-09-25 MED ORDER — ATENOLOL 50 MG PO TABS
50.0000 mg | ORAL_TABLET | Freq: Every day | ORAL | Status: DC
Start: 2017-09-25 — End: 2017-09-26
  Filled 2017-09-25: qty 1

## 2017-09-25 MED ORDER — HYDROXYZINE HCL 25 MG PO TABS
25.0000 mg | ORAL_TABLET | Freq: Three times a day (TID) | ORAL | Status: DC
  Filled 2017-09-25 (×2): qty 1

## 2017-09-25 MED ORDER — AYR SALINE NASAL GEL NA SWAB: 1.0000 | Freq: Every day | NASAL | Status: DC | PRN

## 2017-09-25 MED ORDER — SALINE SPRAY 0.65 % NA SOLN
1.0000 | Freq: Once | NASAL | Status: AC
  Administered 2017-09-25: 1 via NASAL
  Filled 2017-09-25: qty 44

## 2017-09-25 MED ORDER — SALINE SPRAY 0.65 % NA SOLN
1.0000 | Freq: Every day | NASAL | Status: DC | PRN
  Filled 2017-09-25: qty 44

## 2017-09-25 MED ORDER — RHINASE NA GEL: 1.0000 "application " | Freq: Every day | NASAL | Status: DC | PRN

## 2017-09-25 NOTE — ED Provider Notes (Signed)
[facility] DEPT Provider Note   CSN: [id number] Arrival date & time: 09/25/17  1110     History   Chief Complaint Chief Complaint  Patient presents with  . IVC  . Suicidal    HPI Destiny Elliott is a 66 y.o. female. Chief complaint is placed in her IVC by husband.  HPI:  85 showed female. Here, 3 by [facility] PD. She states she has a history of depression and anxiety. 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 She states that she has reactions to antidepressants and cannot take them. She states they make her head feel funny occasionally give her a dry mouth. Most recently prescribed Zoloft about 3 or 4 weeks ago and had a dry mouth and visual reaction to this. Had negative evaluation ER but is disc continued since then.  She states that she and her husband had interactions morning. She admits that she is "afraid to say so, because if I say what I said to him you might keep me here". Patient states that she was at [facility] and that she was buying .38 caliber handgun immediate attention because it was "on my list of things to buy". She states that her daughter currently has her gun.  She denies that she was there to purchase it to herself. She will not deny that she stated she was going there to buy it to hurt herself.  Past Medical History:  Diagnosis Date  . Abnormal Pap smear 1994   --CIN I  . Allergic rhinitis   . Anxiety   . Dense breasts   . Endocervical polyp 01/1993   -benign  . Hypercholesterolemia   . Hypertension   . Osteopenia     There are no active problems to display for this patient.   Past Surgical History:  Procedure Laterality Date  . CERVICAL BIOPSY  W/ LOOP ELECTRODE EXCISION  03/1993   CIN I  . COLONOSCOPY  2016  . endocervical polyp  01-27-93   -benign  . TOOTH EXTRACTION  08/2017    OB History    Gravida Para Term Preterm AB Living   2 2 2  0 0 2   SAB TAB Ectopic Multiple Live Births   0 0 0 0 2        Home Medications    Prior to Admission medications   Medication Sig Start Date End Date Taking? Authorizing Provider  Aloe-Sodium Chloride (AYR SALINE NASAL GEL) SWAB Place 1 each daily as needed into the nose (for congestion).   Yes [provider]  ALPRAZolam (XANAX) 0.25 MG tablet Take 0.125-0.25 mg 2 (two) times daily as needed by mouth for anxiety or sleep.  05/08/17  Yes [provider]  atenolol (TENORMIN) 50 MG tablet Take 50 mg by mouth daily.    Yes [provider]  Nasal Moisturizer Combination (RHINASE) GEL Place 1 application daily as needed into the nose (for congestion).   Yes [provider]  sodium chloride (OCEAN) 0.65 % SOLN nasal spray Place 1 spray into both nostrils daily as needed for congestion.   Yes [provider]    Family History Family History  Problem Relation Age of Onset  . Stroke Father 28  . Kidney disease Father   . Hypertension Father   . Hypertension Sister 68  . Hyperlipidemia Sister   . COPD Sister   . Breast cancer Sister 82  . Thyroid disease Mother 67  . Heart failure Mother   .  Heart disease Mother   . Hypertension Mother   . COPD Mother   . Heart disease Brother 65       AFIB  . Suicidality Son 39       07/20/2013  . Thyroid disease Sister 3    Social History Social History   Tobacco Use  . Smoking status: Former Smoker    Packs/day: 0.50    Years: 28.00    Pack years: 14.00    Types: Cigarettes    Last attempt to quit: 07/01/2004    Years since quitting: 13.2  . Smokeless tobacco: Never Used  Substance Use Topics  . Alcohol use: Yes    Comment: occ wine  . Drug use: No     Allergies   Nickel and Vitamin d analogs   Review of Systems Review of Systems  Constitutional: Negative for appetite change, chills, diaphoresis, fatigue and fever.  HENT: Negative for mouth sores, sore throat and trouble swallowing.   Eyes: Negative for visual disturbance.  Respiratory:  Negative for cough, chest tightness, shortness of breath and wheezing.   Cardiovascular: Negative for chest pain.  Gastrointestinal: Negative for abdominal distention, abdominal pain, diarrhea, nausea and vomiting.  Endocrine: Negative for polydipsia, polyphagia and polyuria.  Genitourinary: Negative for dysuria, frequency and hematuria.  Musculoskeletal: Negative for gait problem.  Skin: Negative for color change, pallor and rash.  Neurological: Negative for dizziness, syncope, light-headedness and headaches.  Hematological: Does not bruise/bleed easily.  Psychiatric/Behavioral: Positive for dysphoric mood and sleep disturbance. Negative for behavioral problems and confusion.     Physical Exam Updated Vital Signs BP (!) 163/104 (BP Location: Left Arm)   Pulse (!) 112   Temp 97.6 F (36.4 C) (Oral)   Resp 18   Ht 5\' 2"  (1.575 m)   Wt 61.7 kg (136 lb)   LMP 10/14/2002 (Approximate)   BMI 24.87 kg/m   Physical Exam  Constitutional: She is oriented to person, place, and time. She appears well-developed and well-nourished. No distress.  HENT:  Head: Normocephalic.  Eyes: Conjunctivae are normal. Pupils are equal, round, and reactive to light. No scleral icterus.  Neck: Normal range of motion. Neck supple. No thyromegaly present.  Cardiovascular: Normal rate and regular rhythm. Exam reveals no gallop and no friction rub.  No murmur heard. Pulmonary/Chest: Effort normal and breath sounds normal. No respiratory distress. She has no wheezes. She has no rales.  Abdominal: Soft. Bowel sounds are normal. She exhibits no distension. There is no tenderness. There is no rebound.  Musculoskeletal: Normal range of motion.  Neurological: She is alert and oriented to person, place, and time.  Skin: Skin is warm and dry. No rash noted.  Psychiatric: She has a normal mood and affect. Her behavior is normal.  She does seem slightly anxious at times. She freely communicate with me. She will not  discuss whether or not she had a suicidal statement to her husband this morning for reasons stated above. I discussed with her that IVC is in progress and she will undergo urine and blood evaluation relatively psychiatric evaluation and she is agreeable.     ED Treatments / Results  Labs (all labs ordered are listed, but only abnormal results are displayed) Labs Reviewed  COMPREHENSIVE METABOLIC PANEL - Abnormal; Notable for the following components:      Result Value   Total Protein 8.6 (*)    All other components within normal limits  ACETAMINOPHEN LEVEL - Abnormal; Notable for the following components:  Acetaminophen (Tylenol), Serum <10 (*)    All other components within normal limits  ETHANOL  SALICYLATE LEVEL  CBC  RAPID URINE DRUG SCREEN, HOSP PERFORMED    EKG  EKG Interpretation None       Radiology No results found.  Procedures Procedures (including critical care time)  Medications Ordered in ED Medications - No data to display   Initial Impression / Assessment and Plan / ED Course  I have reviewed the triage vital signs and the nursing notes.  Pertinent labs & imaging results that were available during my care of the patient were reviewed by me and considered in my medical decision making (see chart for details).     Await TTS evaluation. Medically clear.  Final Clinical Impressions(s) / ED Diagnoses   Final diagnoses:  Emotional crisis    ED Discharge Orders    None       Tanna Furry, MD 09/25/17 1418

## 2017-09-25 NOTE — ED Notes (Signed)
Bed: [id number] Expected date:  Expected time:  Means of arrival:  Comments: Triage 4 

## 2017-09-25 NOTE — ED Notes (Signed)
Pt reports side effects to Paxil, Remeron, Buspar, Zoloft, gabapentin, hydroxyzine, Xanax, Sammy.  Atenolol causes nasal dryness. She is allergic to antihistamines, Tylenol PM and Benadryl.

## 2017-09-25 NOTE — ED Notes (Signed)
[employer] Called for transportation. Patient aware of her transfer. Awaits transport at this time.

## 2017-09-25 NOTE — ED Notes (Signed)
Patient's husband reports that the patient was threatening to shoot herself and ws found at [employer] attempting to buy ammo. [employer] brought IVC papers in.. Patient's son committed suicide several years ago by shooting himself at their home.

## 2017-09-25 NOTE — ED Notes (Signed)
Bed: [facility]4 Expected date:  Expected time:  Means of arrival:  Comments: 

## 2017-09-25 NOTE — ED Notes (Signed)
Pt has been changed and belongings have been monitored.

## 2017-09-25 NOTE — ED Notes (Signed)
Patient left with her belongings to [facility] via law enforcement. Patient is ambulatory and in stable condition.

## 2017-09-25 NOTE — ED Notes (Signed)
Patient anxious over going to [facility]. Emotional support and encouragement offered.

## 2017-09-25 NOTE — BH Assessment (Signed)
[facility] Assessment Progress Note  Per Ethelene Hal, FNP, this pt requires psychiatric hospitalization.  Letitia Libra, RN, [facility] has assigned pt to [facility] Rm 402-2.  Pt presents under IVC initiated by pt's spouse, and upheld by EDP Gareth Morgan, MD, and IVC documents have been faxed to [facility].  Pt's nurse, Diane, has been notified, and agrees to call report to [phone].  Pt is to be transported via Event organ[employer].   Jalene Mullet, [place] Triage Specialist [phone]

## 2017-09-25 NOTE — BH Assessment (Addendum)
Assessment Note  Destiny Elliott is an 66 y.o. female that presents this date under IVC. Per IVC: "Respondent has stopped taking her medications three days ago. The medication  she is on is on is Atenezol (50mg ) . Respondent states she can no longer live like this anymore and went to purchase bullets at [facility]. The respondent had a empty gun squeezing the trigger over and over and left her residence this date to purchase ammunition. Her son committed suicide 4 years ago and his death really affected the respondent. She is a danger to herself." Patient is very tearful and liable during the assessment. Patient states she is currently receiving services from [facility] but has only seen him once. Patient has been prescribed Xanax PRN for anxiety but states "she rarely takes it." Patient is very anxious and is pressured with her speech. Patient stated her son committed suicide 4 years ago and she "never got over it." Patient reports that she has been suffering from depression and anxiety since then reporting ongoing symptoms to include: guilt, hopelessness and isolating. Patient stated she has seen by multiple providers and tried various medications reporting "all of them did not work or had bad side effects." Patient states her marriage of 8 years has been strained by her ongoing [place] issues and her and her husband "just don't get along at times." Patient denies any prior attempts/gestures at self harm or inpatient admissions to address any MH issues. Patient is time/place oriented and denies any H/I or AVH. Patient does admit to ongoing thoughts of self harm this date and was planning to purchase ammunition to take her life this date. Patient states she "his the gun last night" but did inform her husband this date who has secured that firearm. Per notes, patient's husband reports that the patient was threatening to shoot herself and was found at [place] attempting to buy ammunition. [employer] brought IVC papers in.  Patient's son committed suicide several years ago by shooting himself at their home. Patient reports she has a history of depression and anxiety. 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 She states that she has reactions to antidepressants and cannot take them. She states "they make her head feel funny and occasionally give her a dry mouth". Most recently prescribed Zoloft about 3 or 4 weeks ago and had a dry mouth and visual reaction to this. She admits to ongoing thoughts of self harm but states " I am not sure I could go through with it." Per note review patient was seen at [facility] on 05/26/17 as a walk for similar symptoms but did not meet inpatient criteria as was instructed to follow up with OP provider. Case was staffed with Romilda Garret DNP who recommended a inpatient admission as appropriate bed placement is investigated.      Diagnosis: F33.2 MDD recurrent without psychotic features, severe  Past Medical History:  Past Medical History:  Diagnosis Date  . Abnormal Pap smear 1994   --CIN I  . Allergic rhinitis   . Anxiety   . Dense breasts   . Endocervical polyp 01/1993   -benign  . Hypercholesterolemia   . Hypertension   . Osteopenia     Past Surgical History:  Procedure Laterality Date  . CERVICAL BIOPSY  W/ LOOP ELECTRODE EXCISION  03/1993   CIN I  . COLONOSCOPY  2016  . endocervical polyp  01-27-93   -benign  . TOOTH EXTRACTION  08/2017    Family History:  Family History  Problem Relation Age of Onset  . Stroke Father 50  . Kidney disease Father   . Hypertension Father   . Hypertension Sister 51  . Hyperlipidemia Sister   . COPD Sister   . Breast cancer Sister 70  . Thyroid disease Mother 10  . Heart failure Mother   . Heart disease Mother   . Hypertension Mother   . COPD Mother   . Heart disease Brother 49       AFIB  . Suicidality Son 39       07/20/2013  . Thyroid disease Sister 73    Social History:  reports that she quit smoking about 13 years ago. Her smoking  use included cigarettes. She has a 14.00 pack-year smoking history. she has never used smokeless tobacco. She reports that she drinks alcohol. She reports that she does not use drugs.  Additional Social History:  Alcohol / Drug Use Pain Medications: denies Prescriptions: denies Over the Counter: denies History of alcohol / drug use?: No history of alcohol / drug abuse Longest period of sobriety (when/how long): denies Negative Consequences of Use: (Denies) Withdrawal Symptoms: (Denies)  CIWA: CIWA-Ar BP: (!) 163/104 Pulse Rate: (!) 112 COWS:    Allergies:  Allergies  Allergen Reactions  . Nickel Itching  . Vitamin D Analogs     doesn't take Vit D supplements because they make her feel bad     Home Medications:  (Not in a hospital admission)  OB/GYN Status:  Patient's last menstrual period was 10/14/2002 (approximate).  General Assessment Data Location of Assessment: [facility] TTS Assessment: In system Is this a Tele or Face-to-Face Assessment?: Face-to-Face Is this an Initial Assessment or a Re-assessment for this encounter?: Initial Assessment Marital status: Married [place] name: NA Is patient pregnant?: No Pregnancy Status: No Living Arrangements: Spouse/significant other Can pt return to current living arrangement?: Yes Admission Status: Involuntary Is patient capable of signing voluntary admission?: Yes Referral Source: Self/Family/Friend Insurance type: Programmer, applications Exam ([place]) Medical Exam completed: Yes  Crisis Care Plan Living Arrangements: Spouse/significant other Legal Guardian: Other:(NA) Name of Psychiatrist: [facility] MD Name of Therapist: [facility])  Education Status Is patient currently in school?: No Current Grade: NA Highest grade of school patient has completed: 12 Name of school: (NA) Contact person: (NA)  Risk to self with the past 6 months Suicidal Ideation: Yes-Currently Present Has  patient been a risk to self within the past 6 months prior to admission? : No Suicidal Intent: Yes-Currently Present Has patient had any suicidal intent within the past 6 months prior to admission? : No Is patient at risk for suicide?: Yes Suicidal Plan?: Yes-Currently Present Has patient had any suicidal plan within the past 6 months prior to admission? : No Specify Current Suicidal Plan: Plan to shoot herself  Access to Means: Yes Specify Access to Suicidal Means: Pt has a weapon (gun)  What has been your use of drugs/alcohol within the last 12 months?: Denies Previous Attempts/Gestures: No How many times?: 0 Other Self Harm Risks: NA Triggers for Past Attempts: Unpredictable Intentional Self Injurious Behavior: None Family Suicide History: Yes Recent stressful life event(s): Other (Comment)(death of son ) Persecutory voices/beliefs?: No Depression: Yes Depression Symptoms: Feeling angry/irritable, Guilt Substance abuse history and/or treatment for substance abuse?: No Suicide prevention information given to non-admitted patients: Not applicable  Risk to Others within the past 6 months Homicidal Ideation: No Does patient have any lifetime risk of violence toward others  beyond the six months prior to admission? : No Thoughts of Harm to Others: No-Not Currently Present/Within Last 6 Months Current Homicidal Intent: No Current Homicidal Plan: No Access to Homicidal Means: No Identified Victim: NA History of harm to others?: No Assessment of Violence: None Noted Violent Behavior Description: NA Does patient have access to weapons?: Yes (Comment) Criminal Charges Pending?: No Does patient have a court date: No Is patient on probation?: No  Psychosis Hallucinations: None noted Delusions: None noted  Mental Status Report Appearance/Hygiene: In scrubs Eye Contact: Fair Motor Activity: Freedom of movement Speech: Pressured Level of Consciousness: Restless Mood: Depressed,  Labile, Anxious Affect: Anxious, Depressed Anxiety Level: Severe Thought Processes: Coherent, Relevant Judgement: Unimpaired Orientation: Person, Place, Time Obsessive Compulsive Thoughts/Behaviors: None  Cognitive Functioning Concentration: Decreased Memory: Recent Intact, Remote Intact IQ: Average Insight: Fair Impulse Control: Fair Appetite: Fair Weight Loss: 0 Weight Gain: 0 Sleep: No Change Total Hours of Sleep: 7 Vegetative Symptoms: None  ADLScreening [facility] Assessment Services) Patient's cognitive ability adequate to safely complete daily activities?: Yes Patient able to express need for assistance with ADLs?: Yes Independently performs ADLs?: Yes (appropriate for developmental age)  Prior Inpatient Therapy Prior Inpatient Therapy: No Prior Therapy Dates: NA Prior Therapy Facilty/Provider(s): NA Reason for Treatment: NA  Prior Outpatient Therapy Prior Outpatient Therapy: Yes Prior Therapy Dates: Ongoing  Prior Therapy Facilty/Provider(s): [facility] MD Reason for Treatment: Med mang Does patient have an ACCT team?: No Does patient have Intensive In-House Services?  : No Does patient have [facility] services? : No Does patient have P4CC services?: No  ADL Screening (condition at time of admission) Patient's cognitive ability adequate to safely complete daily activities?: Yes Is the patient deaf or have difficulty hearing?: No Does the patient have difficulty seeing, even when wearing glasses/contacts?: No Does the patient have difficulty concentrating, remembering, or making decisions?: No Patient able to express need for assistance with ADLs?: Yes Does the patient have difficulty dressing or bathing?: No Independently performs ADLs?: Yes (appropriate for developmental age) Does the patient have difficulty walking or climbing stairs?: No Weakness of Legs: None Weakness of Arms/Hands: None  Home Assistive Devices/Equipment Home Assistive Devices/Equipment:  None  Therapy Consults (therapy consults require a physician order) PT Evaluation Needed: No OT Evalulation Needed: No SLP Evaluation Needed: No Abuse/Neglect Assessment (Assessment to be complete while patient is alone) Physical Abuse: Denies Verbal Abuse: Denies Sexual Abuse: Denies Exploitation of patient/patient's resources: Denies Self-Neglect: Denies Values / Beliefs Cultural Requests During Hospitalization: None Spiritual Requests During Hospitalization: None Consults Spiritual Care Consult Needed: No Social Work Consult Needed: No Regulatory affairs officer (For Healthcare) Does Patient Have a Medical Advance Directive?: No Would patient like information on creating a medical advance directive?: No - Patient declined    Additional Information 1:1 In Past 12 Months?: No CIRT Risk: No Elopement Risk: No Does patient have medical clearance?: Yes     Disposition: Case was staffed with Romilda Garret DNP who recommended a inpatient admission as appropriate bed placement is investigated.    Disposition Initial Assessment Completed for this Encounter: Yes Disposition of Patient: Inpatient treatment program Type of inpatient treatment program: Adult  On Site Evaluation by:   Reviewed with Physician:    Mamie Nick 09/25/2017 2:41 PM

## 2017-09-25 NOTE — ED Triage Notes (Addendum)
Patient was brought in by [employer]. Patient's husband is obtaining IVC paperwork. Patient's husband told [employer] that the patient was threatening to kill herself, but patient denies. Patient states she was seen by Dr. Judithann Graves and was given prescriptions, but was unable to fill because she has had reactions to the meds he ordered for her.

## 2017-09-25 NOTE — ED Provider Notes (Signed)
Please see prior note for history, physical and care.  This is a 66 year old female who presented with concerns for suicidal ideation, with patient going to [place] to buy ammunition to kill herself with a gun.  I have seen the patient, evaluated prior IVC, and placed the first opinion.  She also is concerned regarding chronic dry sinuses. Has seen ENT about this and recommend their continued evaluation. Ordered her nasal gel.    Gareth Morgan, MD 09/25/17 1710

## 2017-09-25 NOTE — BH Assessment (Signed)
[place] Assessment Progress Note Case was staffed with Romilda Garret DNP who recommended a inpatient admission as appropriate bed placement is investigated.

## 2017-09-25 NOTE — ED Notes (Signed)
On admission to the [facility] pt becomes tearful when talking about feeling overwhelmed. 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 Denies SI/HI, but per report she has made threats with intent to use a gun.

## 2017-09-26 ENCOUNTER — Encounter (HOSPITAL_COMMUNITY): Payer: Self-pay | Admitting: *Deleted

## 2017-09-26 ENCOUNTER — Other Ambulatory Visit: Payer: Self-pay

## 2017-09-26 ENCOUNTER — Inpatient Hospital Stay (HOSPITAL_COMMUNITY)
Admission: AD | Admit: 2017-09-26 | Discharge: 2017-09-30 | DRG: 885 | Disposition: A | Discharge status: Home / Self Care | Payer: Medicare HMO | Source: Intra-hospital | Attending: Psychiatry | Admitting: Psychiatry

## 2017-09-26 DIAGNOSIS — R450 Nervousness: Secondary | ICD-10-CM | POA: Diagnosis not present

## 2017-09-26 DIAGNOSIS — G4700 Insomnia, unspecified: Secondary | ICD-10-CM

## 2017-09-26 DIAGNOSIS — Z91128 Patient's intentional underdosing of medication regimen for other reason: Secondary | ICD-10-CM

## 2017-09-26 DIAGNOSIS — Z79899 Other long term (current) drug therapy: Secondary | ICD-10-CM | POA: Diagnosis not present

## 2017-09-26 DIAGNOSIS — F310 Bipolar disorder, current episode hypomanic: Secondary | ICD-10-CM

## 2017-09-26 DIAGNOSIS — F419 Anxiety disorder, unspecified: Secondary | ICD-10-CM | POA: Diagnosis not present

## 2017-09-26 DIAGNOSIS — R4587 Impulsiveness: Secondary | ICD-10-CM | POA: Diagnosis not present

## 2017-09-26 DIAGNOSIS — Z634 Disappearance and death of family member: Secondary | ICD-10-CM | POA: Diagnosis not present

## 2017-09-26 DIAGNOSIS — E7800 Pure hypercholesterolemia, unspecified: Secondary | ICD-10-CM | POA: Diagnosis not present

## 2017-09-26 DIAGNOSIS — R45851 Suicidal ideations: Secondary | ICD-10-CM

## 2017-09-26 DIAGNOSIS — F4010 Social phobia, unspecified: Secondary | ICD-10-CM | POA: Diagnosis not present

## 2017-09-26 DIAGNOSIS — Z87891 Personal history of nicotine dependence: Secondary | ICD-10-CM | POA: Diagnosis not present

## 2017-09-26 DIAGNOSIS — I10 Essential (primary) hypertension: Secondary | ICD-10-CM | POA: Diagnosis not present

## 2017-09-26 DIAGNOSIS — F410 Panic disorder [episodic paroxysmal anxiety] without agoraphobia: Secondary | ICD-10-CM | POA: Diagnosis not present

## 2017-09-26 DIAGNOSIS — Z888 Allergy status to other drugs, medicaments and biological substances status: Secondary | ICD-10-CM

## 2017-09-26 DIAGNOSIS — R69 Illness, unspecified: Secondary | ICD-10-CM | POA: Diagnosis not present

## 2017-09-26 DIAGNOSIS — Z818 Family history of other mental and behavioral disorders: Secondary | ICD-10-CM | POA: Diagnosis not present

## 2017-09-26 DIAGNOSIS — R441 Visual hallucinations: Secondary | ICD-10-CM

## 2017-09-26 DIAGNOSIS — F39 Unspecified mood [affective] disorder: Secondary | ICD-10-CM | POA: Diagnosis not present

## 2017-09-26 DIAGNOSIS — R4586 Emotional lability: Secondary | ICD-10-CM

## 2017-09-26 DIAGNOSIS — F319 Bipolar disorder, unspecified: Principal | ICD-10-CM | POA: Diagnosis present

## 2017-09-26 DIAGNOSIS — X58XXXA Exposure to other specified factors, initial encounter: Secondary | ICD-10-CM | POA: Diagnosis present

## 2017-09-26 DIAGNOSIS — T424X6A Underdosing of benzodiazepines, initial encounter: Secondary | ICD-10-CM | POA: Diagnosis present

## 2017-09-26 MED ORDER — MAGNESIUM HYDROXIDE 400 MG/5ML PO SUSP: 30.0000 mL | Freq: Every day | ORAL | Status: DC | PRN

## 2017-09-26 MED ORDER — ALPRAZOLAM 0.25 MG PO TABS
0.1250 mg | ORAL_TABLET | Freq: Two times a day (BID) | ORAL | Status: DC | PRN
  Administered 2017-09-26: 0.25 mg via ORAL
  Filled 2017-09-26: qty 1

## 2017-09-26 MED ORDER — TRAZODONE HCL 50 MG PO TABS
50.0000 mg | ORAL_TABLET | Freq: Every evening | ORAL | Status: DC | PRN
  Filled 2017-09-26 (×12): qty 1

## 2017-09-26 MED ORDER — LORAZEPAM 0.5 MG PO TABS
0.5000 mg | ORAL_TABLET | Freq: Four times a day (QID) | ORAL | Status: DC | PRN
  Administered 2017-09-27: 0.5 mg via ORAL
  Filled 2017-09-26: qty 1

## 2017-09-26 MED ORDER — TRAZODONE HCL 50 MG PO TABS
50.0000 mg | ORAL_TABLET | Freq: Every evening | ORAL | Status: DC | PRN
  Filled 2017-09-26 (×4): qty 1

## 2017-09-26 MED ORDER — ATENOLOL 50 MG PO TABS
50.0000 mg | ORAL_TABLET | Freq: Every day | ORAL | Status: DC
  Administered 2017-09-27 – 2017-09-30 (×4): 50 mg via ORAL
  Filled 2017-09-26 (×5): qty 1
  Filled 2017-09-26 (×2): qty 2
  Filled 2017-09-26 (×2): qty 1
  Filled 2017-09-26: qty 2

## 2017-09-26 MED ORDER — RHINASE NA GEL
1.0000 "application " | Freq: Every day | NASAL | Status: DC | PRN
  Administered 2017-09-26 – 2017-09-29 (×2): 1 via NASAL

## 2017-09-26 MED ORDER — HYDROXYZINE HCL 25 MG PO TABS: 25.0000 mg | ORAL_TABLET | Freq: Four times a day (QID) | ORAL | Status: DC | PRN

## 2017-09-26 MED ORDER — ALUM & MAG HYDROXIDE-SIMETH 200-200-20 MG/5ML PO SUSP: 30.0000 mL | ORAL | Status: DC | PRN

## 2017-09-26 NOTE — Plan of Care (Signed)
Safety Care Plan Documentation  Goal: Periods of time without injury will increase Intervention: Patient is on q15 minute safety checks and low fall risk precautions. Patient verbally contracts for safety on the unit. Outcome: Patient remains safe at this time  09/26/2017 4:21 AM - Progressing by Annia Friendly, RN

## 2017-09-26 NOTE — Progress Notes (Signed)
Recreation Therapy Notes  Animal-Assisted Activity (AAA) Program Checklist/Progress Notes Patient Eligibility Criteria Checklist & Daily Group note for Rec TxIntervention  Date: 11.13.2018 Time: 2:45pm Location: [address]   AAA/T Program Assumption of Risk Form signed by Patient/ or Parent Legal Guardian Yes  Patient is free of allergies or sever asthma Yes  Patient reports no fear of animals Yes  Patient reports no history of cruelty to animals Yes  Patient understands his/her participation is voluntary Yes  Patient washes hands before animal contact Yes  Patient washes hands after animal contact Yes  Behavioral Response: Appropriate   Education:Hand Washing, Appropriate Animal Interaction   Education Outcome: Acknowledges education.   Clinical Observations/Feedback: Patient attended session and interacted appropriately with therapy dog and peers.   Destiny Elliott Destiny Elliott, LRT/CTRS        Destiny Elliott L 09/26/2017 3:12 PM

## 2017-09-26 NOTE — BHH Suicide Risk Assessment (Signed)
[facility] Admission Suicide Risk Assessment   Nursing information obtained from:   patient and chart  Demographic factors:   66 year old married female, has two children ( one deceased ), self employed  Current Mental Status:   see below  Loss Factors:   son passed away from suicide 4 years ago, states husband has medical issues, DM, which he does not properly manage Historical Factors:   states she has not had any prior psychiatric admissions, has  Risk Reduction Factors:   resilience , sense or responsibility to family   Total Time spent with patient: 45 minutes Principal Problem: Bipolar disorder, unspecified ([place]) Diagnosis:   Patient Active Problem List   Diagnosis Date Noted  . Bipolar disorder, unspecified ([place]) [F31.9] 09/26/2017     Continued Clinical Symptoms:  Alcohol Use Disorder Identification Test Final Score (AUDIT): 3 The "Alcohol Use Disorders Identification Test", Guidelines for Use in Primary Care, Second Edition.  World Pharmacologist [facility]). Score between 0-7:  no or low risk or alcohol related problems. Score between 8-15:  moderate risk of alcohol related problems. Score between 16-19:  high risk of alcohol related problems. Score 20 or above:  warrants further diagnostic evaluation for alcohol dependence and treatment.   CLINICAL FACTORS:  Patient is a 66 year old married female , brought to hospital via the police, under IVC generated by husband . States she had told her husband that she was going to a store to buy bullets for the firearm. States " I guess he surmised I was going to kill myself ", and contacted the police.  Patient minimizes above . States she had an argument with her husband due to which she told him she would go purchase bullets , but states she had no intention to hurt self or anyone else, and states " it was just a stupid thing I said in the spur of the moment ". Patient minimizes depression at this time, but reports she has been feeling  anxious, describes worrying excessively, for which she has recently been tried on several psychiatric medications , which she states have caused her to feel worse .  States she had recently been tried on Remeron, Paxil, Sam-E,  Zoloft , all of which she took only a few days because " they made me feel worse" , " jittery", " agitated", " hyper"  No prior psychiatric admissions, has never attempted suicide, denies history of psychosis . Denies history of depression, denies history of mania or hypomania, but as above reports negative reactions to several prior antidepressant trials, which caused activation, agitation .   Denies alcohol or drug abuse   Medical History is remarkable for HTN. Denies Allergies, but states antihistamines cause activation .   Dx- Anxiety, consider GAD, consider Bipolar Disorder ( based on antidepressant induced activation)   Plan - inpatient admission .  We discussed medication options, at this time patient reluctant to consider a mood stabilizer or standing psychiatric medication, states " taking medications makes me nervous, and I feel better without them". She does agree to Ativan PRNs for anxiety if needed . Will D/C Trazodone and Vistaril PRNs based on history.  Check TSH      Musculoskeletal: Strength & Muscle Tone: within normal limits Gait & Station: normal Patient leans: N/A  Psychiatric Specialty Exam: Physical Exam  ROS no headaches, no chest pain, no shortness of breath, no vomiting, no nausea.   Blood pressure (!) 142/88, pulse (!) 112, temperature 98.8 F (37.1 C),  temperature source Oral, resp. rate 16, height 5\' 2"  (1.575 m), weight 59.9 kg (132 lb), last menstrual period 10/14/2002.Body mass index is 24.14 kg/m.  General Appearance: Fairly Groomed  Eye Contact:  Good  Speech:  Pressured  Volume:  Normal  Mood:  denies depression , states " my problem is anxiety, not depression"  Affect:  vaguely anxious   Thought Process:  Linear and  Descriptions of Associations: Intact  Orientation:  Other:  fully alert and attentive   Thought Content:  denies hallucinations, no delusions, not internally preoccupied   Suicidal Thoughts:  No denies suicidal or self injurious ideations   Homicidal Thoughts:  No denies any homicidal or violent ideations   Memory:  recent and remote grossly intact   Judgement:  Fair  Insight:  Fair  Psychomotor Activity:  Normal  Concentration:  Concentration: Good and Attention Span: Good  Recall:  Good  Fund of Knowledge:  Good  Language:  Good  Akathisia:  Negative  Handed:  Right  AIMS (if indicated):     Assets:  Desire for Improvement Resilience  ADL's:  Intact  Cognition:  WNL  Sleep:  Number of Hours: 3      COGNITIVE FEATURES THAT CONTRIBUTE TO RISK:  Decreased level of functioning   SUICIDE RISK:   Moderate:  Frequent suicidal ideation with limited intensity, and duration, some specificity in terms of plans, no associated intent, good self-control, limited dysphoria/symptomatology, some risk factors present, and identifiable protective factors, including available and accessible social support.  PLAN OF CARE: Patient will be admitted to inpatient psychiatric unit for stabilization and safety. Will provide and encourage milieu participation. Provide medication management and maked adjustments as needed.  Will follow daily.    I certify that inpatient services furnished can reasonably be expected to improve the patient's condition.   Jenne Campus, MD 09/26/2017, 6:48 PM

## 2017-09-26 NOTE — Progress Notes (Signed)
D: Patient appears extremely indecisive.  She was offered her atenolol this morning and could not decide if she should take it.  I informed her of her elevated blood pressure.  Patient is preoccupied with her sinuses. Patient complains of itching, scalp crawling, "my face and eyes are very red.  I have very dry sinuses."   She states, "I really don't want to take it because it makes me dried out."  Patient talked with the pharmacist about side effects and still would not take it.  Patient states, "can you give me 15 minutes or will I miss my window?"  Her speech is rapid and pressured and she is restless and fidgety.  She has fixed smile and appears anxious.  Patient requested her xanax for anxiety and it was given. She denies any thoughts of self harm. A: Continue to monitor medication management and MD orders.  Safety checks completed every 15 minutes per protocol.  Offer support and encouragement as needed. R: patient is receptive to staff; her behavior is appropriate.

## 2017-09-26 NOTE — Progress Notes (Signed)
Adult Psychoeducational Group Note  Date:  09/26/2017 Time:  9:21 PM  Group Topic/Focus:  Wrap-Up Group:   The focus of this group is to help patients review their daily goal of treatment and discuss progress on daily workbooks.  Participation Level:  Active  Participation Quality:  Appropriate  Affect:  Appropriate  Cognitive:  Appropriate  Insight: Good  Engagement in Group:  Engaged  Modes of Intervention:  Activity  Additional Comments:  Pt rated her day a 84. Goal is to come up with more coping skills for self-confidence.  Donato Heinz 09/26/2017, 9:21 PM

## 2017-09-26 NOTE — H&P (Signed)
Psychiatric Admission Assessment Adult  Patient Identification: Destiny Elliott MRN:  [id number] Date of Evaluation:  09/26/2017 Chief Complaint:  MDD Principal Diagnosis: Bipolar disorder, unspecified ([place]) Diagnosis:   Patient Active Problem List   Diagnosis Date Noted  . Bipolar disorder, unspecified ([place]) [F31.9] 09/26/2017   History of Present Illness:  09/25/17 [facility] Counselor Assessment: 66 y.o. female that presents this date under IVC. Per IVC: "Respondent has stopped taking her medications three days ago. The medication  she is on is on is Atenezol (4m) . Respondent states she can no longer live like this anymore and [place] [place] The respondent had a empty gun squeezing the trigger over and over and left her residence this date to purchase ammunition. Her son committed suicide 4 years ago and his death really affected the respondent. She is a danger to herself." Patient is very tearful and liable during the assessment. [facility] but has only [facility] once. Patient has been prescribed Xanax PRN for anxiety but states "she rarely takes it." Patient is very anxious and is pressured with her speech. Patient stated her son committed suicide 4 years ago and she "never got over it." Patient reports that she has been suffering from depression and anxiety since then reporting ongoing symptoms to include: guilt, hopelessness and isolating. Patient stated she has seen by multiple providers and tried various medications reporting "all of them did not work or had bad side effects." Patient states her marriage of 447years has been strained by her ongoing MGreenbriarissues and her and her husband "just don't get along at times." Patient denies any prior attempts/gestures at self harm or inpatient admissions to address any MH issues. Patient is time/place oriented and denies any H/I or AVH. Patient does admit to ongoing thoughts of self  harm this date and was planning to purchase ammunition to take her life this date. Patient states she "his the gun last night" but did inform her husband this date who has secured that firearm. Per notes, patient's husband reports that the patient was threatening to shoot herself [place] [employer] brought IVC papers in. Patient's son committed suicide several years ago by shooting himself at their home. Patient reports she has a history of depression and anxiety. 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 She states that she has reactions to antidepressants and cannot take them. She states "they make her head feel funny and occasionally give her a dry mouth". Most recently prescribed Zoloft about 3 or 4 weeks ago and had a dry mouth and visual reaction to this. She admits to ongoing thoughts of self harm but states " I am not sure I could go through with it." [facility] review [facility] similar symptoms but did not meet inpatient criteria as was instructed to follow up with OP provider.   Today: Patient presents and she confirms the above and that she did pull the trigger on the gun and go to [employer] to by bullets. She states that when she got there they told her the bullets she was looking at were being inventoried and she thought her husband had called [employer], so she was leaving the store and the police stopped her. She states that she has only had issues since her son committed suicide which was 4 years ago. She as been on several  antidepressants and reports that they have all made her worse. She has had numerous prescriptions for various sleep aides. She denies any symptoms before her son died, except for little sleep and feeling rested and elevated mood at times, but not noticeable to others (she states). Patient is completely focused on sinus dryness and that all medications for mental health have antihistamines in them and it  will make her sinuses worse. He sinuses have been thoroughly evaluated including an MRI, per husband, and nothing is wrong with them. She is also very fearful of taking medications and states that the medications have caused her to be this way. She has also been worried about medications and stopped her Atenolol due to thinking it may cause her dry sinus. She stopped her Xanax 3 weeks ago.  I contacted husband and he confirms that she has had elevated moods for 2-3 days and then severe depression. She has also had little sleep. He states "I don't want to say she has hallucinations, but she things that I don't see when we drive down the road and when I ask her about it she changes what she says." He admits to patient being hyperverbal and rapidly changing topics during conversation. Patient has labile moods and irritability. He reports that before they knew their son was thinking about suicide that they were going on a trip and she showed her son where the pistol was in case he needed it for safety. Later the son committed suicide with the same pistol and the patient found her son after the suicide in the shower with that pistol. The son was diagnosed with Bipolar disorder and the patient states that she just could not accept that diagnosis for herself.      Associated Signs/Symptoms: Depression Symptoms:  depressed mood, difficulty concentrating, suicidal thoughts with specific plan, anxiety, disturbed sleep, (Hypo) Manic Symptoms:  Distractibility, Elevated Mood, Flight of Ideas, Hallucinations, Impulsivity, Labiality of Mood, Anxiety Symptoms:  Panic Symptoms, Social Anxiety, Psychotic Symptoms:  Hallucinations: Visual PTSD Symptoms: Had a traumatic exposure:  found son dead after he committed suicide Total Time spent with patient: 1 hour  Past Psychiatric History: Anxiety, private counselor, several medications for sleep and anxiety  Is the patient at risk to self? Yes.    Has the  patient been a risk to self in the past 6 months? Yes.    Has the patient been a risk to self within the distant past? No.  Is the patient a risk to others? No.  Has the patient been a risk to others in the past 6 months? No.  Has the patient been a risk to others within the distant past? No.   Prior Inpatient Therapy:   Prior Outpatient Therapy:    Alcohol Screening: 1. How often do you have a drink containing alcohol?: 2 to 4 times a month 2. How many drinks containing alcohol do you have on a typical day when you are drinking?: 1 or 2 3. How often do you have six or more drinks on one occasion?: Less than monthly AUDIT-C Score: 3 4. How often during the last year have you found that you were not able to stop drinking once you had started?: Never 5. How often during the last year have you failed to do what was normally expected from you becasue of drinking?: Never 6. How often during the last year have you needed a first drink in the morning to get yourself going after a heavy drinking session?: Never  7. How often during the last year have you had a feeling of guilt of remorse after drinking?: Never 8. How often during the last year have you been unable to remember what happened the night before because you had been drinking?: Never 9. Have you or someone else been injured as a result of your drinking?: No 10. Has a relative or friend or a doctor or another health worker been concerned about your drinking or suggested you cut down?: No Alcohol Use Disorder Identification Test Final Score (AUDIT): 3 Intervention/Follow-up: AUDIT Score <7 follow-up not indicated Substance Abuse History in the last 12 months:  No. Consequences of Substance Abuse: NA Previous Psychotropic Medications: Yes Paxil, Remeron, Buspar, Zoloft, Lunesta, Ambien, Trazodone  Psychological Evaluations: Yes - private counselor, [place] Past Medical History:  Past Medical History:  Diagnosis Date  . Abnormal Pap  smear 1994   --CIN I  . Allergic rhinitis   . Anxiety   . Dense breasts   . Endocervical polyp 01/1993   -benign  . Hypercholesterolemia   . Hypertension   . Osteopenia     Past Surgical History:  Procedure Laterality Date  . CERVICAL BIOPSY  W/ LOOP ELECTRODE EXCISION  03/1993   CIN I  . COLONOSCOPY  2016  . endocervical polyp  01-27-93   -benign  . TOOTH EXTRACTION  08/2017   Family History:  Family History  Problem Relation Age of Onset  . Stroke Father 47  . Kidney disease Father   . Hypertension Father   . Hypertension Sister 68  . Hyperlipidemia Sister   . COPD Sister   . Breast cancer Sister 64  . Thyroid disease Mother 68  . Heart failure Mother   . Heart disease Mother   . Hypertension Mother   . COPD Mother   . Heart disease Brother 42       AFIB  . Suicidality Son 39       07/20/2013  . Thyroid disease Sister 77   Family Psychiatric  History: Son-Bipolar, committed suicide, Sisters - on medications for mental health but unknown diagnosis  Tobacco Screening: Have you used any form of tobacco in the last 30 days? (Cigarettes, Smokeless Tobacco, Cigars, and/or Pipes): No Social History:  Social History   Substance and Sexual Activity  Alcohol Use Yes   Comment: occ wine     Social History   Substance and Sexual Activity  Drug Use No    Additional Social History:      Allergies:   Allergies  Allergen Reactions  . Benadryl [Diphenhydramine Hcl]     Causes nasal dryness  . Nickel Itching  . Tylenol Pm Extra [Diphenhydramine-Apap (Sleep)] Other (See Comments)    Causes nasal dryness  . Vitamin D Analogs     doesn't take Vit D supplements because they make her feel bad    Lab Results:  Results for orders placed or performed during the hospital encounter of 09/25/17 (from the past 48 hour(s))  Rapid urine drug screen (hospital performed)     Status: None   Collection Time: 09/25/17 11:55 AM  Result Value Ref Range   Opiates NONE DETECTED NONE  DETECTED   Cocaine NONE DETECTED NONE DETECTED   Benzodiazepines NONE DETECTED NONE DETECTED   Amphetamines NONE DETECTED NONE DETECTED   Tetrahydrocannabinol NONE DETECTED NONE DETECTED   Barbiturates NONE DETECTED NONE DETECTED    Comment:        DRUG SCREEN FOR MEDICAL PURPOSES ONLY.  IF CONFIRMATION  IS NEEDED FOR ANY PURPOSE, NOTIFY LAB WITHIN 5 DAYS.        LOWEST DETECTABLE LIMITS FOR URINE DRUG SCREEN Drug Class       Cutoff (ng/mL) Amphetamine      1000 Barbiturate      200 Benzodiazepine   354 Tricyclics       656 Opiates          300 Cocaine          300 THC              50   Comprehensive metabolic panel     Status: Abnormal   Collection Time: 09/25/17 12:53 PM  Result Value Ref Range   Sodium 141 135 - 145 mmol/L   Potassium 3.7 3.5 - 5.1 mmol/L   Chloride 105 101 - 111 mmol/L   CO2 26 22 - 32 mmol/L   Glucose, Bld 97 65 - 99 mg/dL   BUN 12 6 - 20 mg/dL   Creatinine, Ser 0.68 0.44 - 1.00 mg/dL   Calcium 9.7 8.9 - 10.3 mg/dL   Total Protein 8.6 (H) 6.5 - 8.1 g/dL   Albumin 4.8 3.5 - 5.0 g/dL   AST 35 15 - 41 U/L   ALT 21 14 - 54 U/L   Alkaline Phosphatase 104 38 - 126 U/L   Total Bilirubin 0.4 0.3 - 1.2 mg/dL   GFR calc non Af Amer >60 >60 mL/min   GFR calc Af Amer >60 >60 mL/min    Comment: (NOTE) The eGFR has been calculated using the CKD EPI equation. This calculation has not been validated in all clinical situations. eGFR's persistently <60 mL/min signify possible Chronic Kidney Disease.    Anion gap 10 5 - 15  Ethanol     Status: None   Collection Time: 09/25/17 12:53 PM  Result Value Ref Range   Alcohol, Ethyl (B) <10 <10 mg/dL    Comment:        LOWEST DETECTABLE LIMIT FOR SERUM ALCOHOL IS 10 mg/dL FOR MEDICAL PURPOSES ONLY   Salicylate level     Status: None   Collection Time: 09/25/17 12:53 PM  Result Value Ref Range   Salicylate Lvl <8.1 2.8 - 30.0 mg/dL  Acetaminophen level     Status: Abnormal   Collection Time: 09/25/17 12:53 PM   Result Value Ref Range   Acetaminophen (Tylenol), Serum <10 (L) 10 - 30 ug/mL    Comment:        THERAPEUTIC CONCENTRATIONS VARY SIGNIFICANTLY. A RANGE OF 10-30 ug/mL MAY BE AN EFFECTIVE CONCENTRATION FOR MANY PATIENTS. HOWEVER, SOME ARE BEST TREATED AT CONCENTRATIONS OUTSIDE THIS RANGE. ACETAMINOPHEN CONCENTRATIONS >150 ug/mL AT 4 HOURS AFTER INGESTION AND >50 ug/mL AT 12 HOURS AFTER INGESTION ARE OFTEN ASSOCIATED WITH TOXIC REACTIONS.   cbc     Status: None   Collection Time: 09/25/17 12:53 PM  Result Value Ref Range   WBC 10.2 4.0 - 10.5 K/uL   RBC 4.59 3.87 - 5.11 MIL/uL   Hemoglobin 14.1 12.0 - 15.0 g/dL   HCT 42.7 36.0 - 46.0 %   MCV 93.0 78.0 - 100.0 fL   MCH 30.7 26.0 - 34.0 pg   MCHC 33.0 30.0 - 36.0 g/dL   RDW 13.3 11.5 - 15.5 %   Platelets 379 150 - 400 K/uL    Blood Alcohol level:  Lab Results  Component Value Date   ETH <10 09/25/2017   ETH <5 27/51/7001    Metabolic Disorder Labs:  No results found for: HGBA1C, MPG No results found for: PROLACTIN No results found for: CHOL, TRIG, HDL, CHOLHDL, VLDL, LDLCALC  Current Medications: Current Facility-Administered Medications  Medication Dose Route Frequency Provider Last Rate Last Dose  . ALPRAZolam Duanne Moron) tablet 0.125-0.25 mg  0.125-0.25 mg Oral BID PRN Laverle Hobby, PA-C   0.25 mg at 09/26/17 0818  . alum & mag hydroxide-simeth (MAALOX/MYLANTA) 200-200-20 MG/5ML suspension 30 mL  30 mL Oral Q4H PRN Laverle Hobby, PA-C      . atenolol (TENORMIN) tablet 50 mg  50 mg Oral Daily Simon, Spencer E, PA-C      . hydrOXYzine (ATARAX/VISTARIL) tablet 25 mg  25 mg Oral Q6H PRN Patriciaann Clan E, PA-C      . magnesium hydroxide (MILK OF MAGNESIA) suspension 30 mL  30 mL Oral Daily PRN Laverle Hobby, PA-C      . traZODone (DESYREL) tablet 50 mg  50 mg Oral QHS,MR X 1 Simon, Spencer E, PA-C       PTA Medications: Medications Prior to Admission  Medication Sig Dispense Refill Last Dose  . Aloe-Sodium  Chloride (AYR SALINE NASAL GEL) SWAB Place 1 each daily as needed into the nose (for congestion).   Past Week at Unknown time  . ALPRAZolam (XANAX) 0.25 MG tablet Take 0.125-0.25 mg 2 (two) times daily as needed by mouth for anxiety or sleep.    Past Month at Unknown time  . atenolol (TENORMIN) 50 MG tablet Take 50 mg by mouth daily.    Past Week at Unknown time  . Nasal Moisturizer Combination (RHINASE) GEL Place 1 application daily as needed into the nose (for congestion).   09/25/2017 at Unknown time  . sodium chloride (OCEAN) 0.65 % SOLN nasal spray Place 1 spray into both nostrils daily as needed for congestion.   Past Week at Unknown time    Musculoskeletal: Strength & Muscle Tone: within normal limits Gait & Station: normal Patient leans: N/A  Psychiatric Specialty Exam: Physical Exam  Nursing note and vitals reviewed. Constitutional: She is oriented to person, place, and time. She appears well-developed and well-nourished.  Musculoskeletal: Normal range of motion.  Neurological: She is alert and oriented to person, place, and time.  Skin: Skin is warm.    Review of Systems  Constitutional: Negative.   HENT: Negative.   Eyes: Negative.   Respiratory: Negative.   Cardiovascular: Negative.   Genitourinary: Negative.   Musculoskeletal: Negative.   Skin: Negative.   Neurological: Negative.   Endo/Heme/Allergies: Negative.   Psychiatric/Behavioral: Positive for depression and suicidal ideas. Negative for hallucinations and substance abuse. The patient is nervous/anxious and has insomnia.     Blood pressure (!) 142/88, pulse (!) 112, temperature 98.8 F (37.1 C), temperature source Oral, resp. rate 16, height _0  (1.575 m), weight 59.9 kg (132 lb), last menstrual period 10/14/2002.Body mass index is 24.14 kg/m.  General Appearance: Casual  Eye Contact:  Good  Speech:  Clear and Coherent and slightly pressured  Volume:  Normal  Mood:  Anxious  Affect:  Flat  Thought  Process:  Goal Directed and Descriptions of Associations: Tangential  Orientation:  Full (Time, Place, and Person)  Thought Content:  Rumination and Tangential  Suicidal Thoughts:  Yes.  with intent/plan  Homicidal Thoughts:  No  Memory:  Immediate;   Good Recent;   Good Remote;   Good  Judgement:  Good  Insight:  Lacking  Psychomotor Activity:  Normal  Concentration:  Concentration: Good and Attention  Span: Good  Recall:  Good  Fund of Knowledge:  Good  Language:  Good  Akathisia:  No  Handed:  Right  AIMS (if indicated):     Assets:  Communication Skills Desire for Improvement Financial Resources/Insurance Housing Physical Health Social Support Transportation  ADL's:  Intact  Cognition:  WNL  Sleep:  Number of Hours: 3    Treatment Plan Summary: Daily contact with patient to assess and evaluate symptoms and progress in treatment, Medication management and Plan is to:  -See SRA and MAR for medication management -Encourage group therapy  Observation Level/Precautions:  15 minute checks  Laboratory:  Reviewed  Psychotherapy:  Group Therapy  Medications:  See [facility]  Consultations:  As needed  Discharge Concerns:  Compliance  Estimated LOS: 3-5 Days  Other:  Admit to [place] for Primary Diagnosis: Bipolar disorder, unspecified ([place]) Long Term Goal(s): Improvement in symptoms so as ready for discharge  Short Term Goals: Ability to verbalize feelings will improve and Ability to disclose and discuss suicidal ideas  Physician Treatment Plan for Secondary Diagnosis: Principal Problem:   Bipolar disorder, unspecified ([facility])  Long Term Goal(s): Improvement in symptoms so as ready for discharge  Short Term Goals: Ability to demonstrate self-control will improve, Ability to identify and develop effective coping behaviors will improve and Compliance with prescribed medications will improve  I certify that inpatient services furnished can reasonably  be expected to improve the patient's condition.    Lewis Shock, FNP 11/13/20182:07 PM  I have discussed case with NP and have met with patient  Agree with NP note and assessment  Patient is a 66 year old married female , brought to hospital via the police, under IVC generated by husband . States she had told her husband that she was going to a store to buy bullets for the firearm. States " I guess he surmised I was going to kill myself ", and contacted the police.  Patient minimizes above . States she had an argument with her husband due to which she told him she would go purchase bullets , but states she had no intention to hurt self or anyone else, and states " it was just a stupid thing I said in the spur of the moment ". Patient minimizes depression at this time, but reports she has been feeling anxious, describes worrying excessively, for which she has recently been tried on several psychiatric medications , which she states have caused her to feel worse .  States she had recently been tried on Remeron, Paxil, Sam-E,  Zoloft , all of which she took only a few days because " they made me feel worse" , " jittery", " agitated", " hyper"  No prior psychiatric admissions, has never attempted suicide, denies history of psychosis . Denies history of depression, denies history of mania or hypomania, but as above reports negative reactions to several prior antidepressant trials, which caused activation, agitation .   Denies alcohol or drug abuse   Medical History is remarkable for HTN. Denies Allergies, but states antihistamines cause activation .   Dx- Anxiety, consider GAD, consider Bipolar Disorder ( based on antidepressant induced activation)   Plan - inpatient admission .  We discussed medication options, at this time patient reluctant to consider a mood stabilizer or standing psychiatric medication, states " taking medications makes me nervous, and I feel better without them". She does  agree to Ativan PRNs for anxiety if needed . Will D/C  Trazodone and Vistaril PRNs based on history.  Check TSH

## 2017-09-26 NOTE — Tx Team (Signed)
Initial Treatment Plan 09/26/2017 1:10 AM Sharyn Creamer FMB:[id number]    PATIENT STRESSORS: Health problems Loss of son 4 years ago Medication change or noncompliance   PATIENT STRENGTHS: Ability for insight Average or above average intelligence Capable of independent living General fund of knowledge Motivation for treatment/growth Supportive family/friends   PATIENT IDENTIFIED PROBLEMS: Depression Anxiety Suicidal thoughts "I want you to address my dry sinus" "To help me deal"                     DISCHARGE CRITERIA:  Ability to meet basic life and health needs Improved stabilization in mood, thinking, and/or behavior Verbal commitment to aftercare and medication compliance  PRELIMINARY DISCHARGE PLAN: Attend aftercare/continuing care group Return to previous living arrangement  PATIENT/FAMILY INVOLVEMENT: This treatment plan has been presented to and reviewed with the patient, KRISHIKA BUGGE, and/or family member, .  The patient and family have been given the opportunity to ask questions and make suggestions.  [place], [place], [place] 09/26/2017, 1:10 AM

## 2017-09-26 NOTE — BHH Group Notes (Signed)
LCSW Group Therapy Note  09/26/2017 1:15pm  Type of Therapy/Topic:  Group Therapy:  Feelings about Diagnosis  Participation Level: Active  Description of Group:   This group will allow patients to explore their thoughts and feelings about diagnoses they have received. Patients will be guided to explore their level of understanding and acceptance of these diagnoses. Facilitator will encourage patients to process their thoughts and feelings about the reactions of others to their diagnosis and will guide patients in identifying ways to discuss their diagnosis with significant others in their lives. This group will be process-oriented, with patients participating in exploration of their own experiences, giving and receiving support, and processing challenge from other group members.   Therapeutic Goals: 1. Patient will demonstrate understanding of diagnosis as evidenced by identifying two or more symptoms of the disorder 2. Patient will be able to express two feelings regarding the diagnosis 3. Patient will demonstrate their ability to communicate their needs through discussion and/or role play  Therapeutic Modalities:   Cognitive Behavioral Therapy Brief Therapy Feelings Identification    Georga Kaufmann, MSW, [facility] 09/26/2017 3:37 PM

## 2017-09-26 NOTE — Progress Notes (Signed)
Nursing Progress Note: 7p-7a D: Pt currently presents with a anxious/pleasant affect and behavior. Pt states "I just get these dry sinuses. I just want it to clear up. I hate it so much. I don't want to take any antihistamines or any beta blockers because they dry out your sinuses. I don't want meds for sleep either." Interacting appropriately with the milieu. Pt reports good sleep during the previous night with current medication regimen. Pt did attend wrap-up group.  A: Pt provided with medications per providers orders. Pt's labs and vitals were monitored throughout the night. Pt supported emotionally and encouraged to express concerns and questions. Pt educated on medications.  R: Pt's safety ensured with 15 minute and environmental checks. Pt currently denies SI, HI, and AVH. Pt verbally contracts to seek staff if SI,HI, or AVH occurs and to consult with staff before acting on any harmful thoughts. Will continue to monitor.

## 2017-09-26 NOTE — Progress Notes (Signed)
Nursing Progress Note [id number]  D) Patient presents with anxious mood and appears fidgety and restless. Patient offered sandwich or salad and unit snacks but refused stating, "I can't have anything with sugar in it". Patient refused sugar-free cookies. Pitcher of ice water provided. Patient requests to be in a medical bed "because I cannot lie flat because I am so dry at night". Patient switched to a medical bed with roommate without asking staff. Patient requests something for anxiety or sleep but refused antihistamines or Trazodone because "they dry me out". Patient reports anxiety and states, "I may need to just pace the halls". Patient denies SI/HI/AVH or pain at this time. Patient contracts for safety on the unit.  A) Emotional support given. 1:1 interaction and active listening provided. Snacks and fluids provided. Opportunities for questions or concerns presented to patient. Labs, vital signs and patient behavior monitored throughout shift. Patient safety maintained with q15 min safety checks. Low fall risk precautions in place and reviewed with patient; patient verbalized understanding.  R) Patient receptive to interaction with nurse. Patient remains safe on the unit at this time. Patient is resting in bed without complaints. Will continue to monitor.

## 2017-09-26 NOTE — Progress Notes (Signed)
Destiny Elliott is a 66 year old female pt admitted on involuntary basis. On admission, she does endorse that she was at [place] parking lot and did have intention of shooting herself. She denies any SI on admission and is able to contract for safety while in the hospital. She reports that she has been prescribed medication but is not taking like she should. She did speak about wanting to take something for anxiety that is not an antihistamine and also something that is non-addicting. She goes off on a tangent about various medical issues that she is currently experiencing but was able to identify the death of her son 4 years ago as a stressor to how she is feeling emotionally. She reports that she lives with her husband and reports that she will go back to the same living situation after discharge. Destiny Elliott was oriented to the unit and safety maintained.

## 2017-09-27 DIAGNOSIS — F39 Unspecified mood [affective] disorder: Secondary | ICD-10-CM

## 2017-09-27 DIAGNOSIS — Z818 Family history of other mental and behavioral disorders: Secondary | ICD-10-CM

## 2017-09-27 DIAGNOSIS — Z87891 Personal history of nicotine dependence: Secondary | ICD-10-CM

## 2017-09-27 LAB — TSH: TSH: 1.875 u[IU]/mL (ref 0.350–4.500)

## 2017-09-27 MED ORDER — ARIPIPRAZOLE 5 MG PO TABS
5.0000 mg | ORAL_TABLET | Freq: Every day | ORAL | Status: DC
  Administered 2017-09-27: 5 mg via ORAL
  Filled 2017-09-27 (×4): qty 1

## 2017-09-27 NOTE — Progress Notes (Signed)
[facility] MD Progress Note  09/27/2017 11:12 AM Destiny Elliott  MRN:  [id number]   Subjective:  Patient reports that she is okay but is worried about her medications. Patient is agreeing with medications would help but is against almost every medication mentioned. Patient states that she will have her husband review the medications to decide if she should take it or not. She denies nay SI/HI/AVH and contracts for safety. She does admit to racing thoughts and constant fixation on various items.   Objective: Patient's chart and findings reviewed and discussed with treatment team. Patient is still hyperverbal and has racing thoughts. Patient has been attending groups, but has been questioning every thing. Patient has been fixated on sinus issues and medications. Will prescribe Abilify 5 mg Daily.   Principal Problem: Bipolar disorder, unspecified ([place]) Diagnosis:   Patient Active Problem List   Diagnosis Date Noted  . Bipolar disorder, unspecified ([place]) [F31.9] 09/26/2017   Total Time spent with patient: 25 minutes  Past Psychiatric History: See H&P  Past Medical History:  Past Medical History:  Diagnosis Date  . Abnormal Pap smear 1994   --CIN I  . Allergic rhinitis   . Anxiety   . Dense breasts   . Endocervical polyp 01/1993   -benign  . Hypercholesterolemia   . Hypertension   . Osteopenia     Past Surgical History:  Procedure Laterality Date  . CERVICAL BIOPSY  W/ LOOP ELECTRODE EXCISION  03/1993   CIN I  . COLONOSCOPY  2016  . endocervical polyp  01-27-93   -benign  . TOOTH EXTRACTION  08/2017   Family History:  Family History  Problem Relation Age of Onset  . Stroke Father 66  . Kidney disease Father   . Hypertension Father   . Hypertension Sister 57  . Hyperlipidemia Sister   . COPD Sister   . Breast cancer Sister 41  . Thyroid disease Mother 71  . Heart failure Mother   . Heart disease Mother   . Hypertension Mother   . COPD Mother   . Heart disease Brother 81        AFIB  . Suicidality Son 39       07/20/2013  . Thyroid disease Sister 8   Family Psychiatric  History: See H&P Social History:  Social History   Substance and Sexual Activity  Alcohol Use Yes   Comment: occ wine     Social History   Substance and Sexual Activity  Drug Use No    Social History   Socioeconomic History  . Marital status: Married    Spouse name: Sherren Mocha  . Number of children: 2  . Years of education: 57  . Highest education level: None  Social Needs  . Financial resource strain: None  . Food insecurity - worry: None  . Food insecurity - inability: None  . Transportation needs - medical: None  . Transportation needs - non-medical: None  Occupational History  . 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 Smoking status: Former Smoker    Packs/day: 0.50    Years: 28.00    Pack years: 14.00    Types: Cigarettes    Last attempt to quit: 07/01/2004    Years since quitting: 13.2  . Smokeless tobacco: Never Used  Substance and Sexual Activity  . Alcohol use: Yes    Comment: occ wine  . Drug use: No  . Sexual activity: Yes    Partners: Male  Birth control/protection: Post-menopausal  Other Topics Concern  . None  Social History Narrative   Lives with Sherren Mocha, one child deceased   Caffeine- 2 cups of tea   Additional Social History:                         Sleep: Good  Appetite:  Good  Current Medications: Current Facility-Administered Medications  Medication Dose Route Frequency Provider Last Rate Last Dose  . alum & mag hydroxide-simeth (MAALOX/MYLANTA) 200-200-20 MG/5ML suspension 30 mL  30 mL Oral Q4H PRN Patriciaann Clan E, PA-C      . ARIPiprazole (ABILIFY) tablet 5 mg  5 mg Oral Daily Money, Lowry Ram, FNP      . atenolol (TENORMIN) tablet 50 mg  50 mg Oral Daily Laverle Hobby, PA-C   50 mg at 09/27/17 0950  . LORazepam (ATIVAN) tablet 0.5 mg  0.5 mg Oral Q6H PRN Rabiah Goeser, Myer Peer, MD   0.5 mg at  09/27/17 0817  . magnesium hydroxide (MILK OF MAGNESIA) suspension 30 mL  30 mL Oral Daily PRN Laverle Hobby, PA-C      . RHINASE GEL 1 application  1 application Nasal Daily PRN Money, Lowry Ram, FNP   1 application at 16/10/96 2154  . traZODone (DESYREL) tablet 50 mg  50 mg Oral QHS,MR X 1 Laverle Hobby, PA-C        Lab Results:  Results for orders placed or performed during the hospital encounter of 09/26/17 (from the past 48 hour(s))  TSH     Status: None   Collection Time: 09/27/17  8:03 AM  Result Value Ref Range   TSH 1.875 0.350 - 4.500 uIU/mL    Comment: Performed by a 3rd Generation assay with a functional sensitivity of <=0.01 uIU/mL. Performed at [facility], [place] [address], [place], [place] [address]     Blood Alcohol level:  Lab Results  Component Value Date   ETH <10 09/25/2017   ETH <5 98/09/9146    Metabolic Disorder Labs: No results found for: HGBA1C, MPG No results found for: PROLACTIN No results found for: CHOL, TRIG, HDL, CHOLHDL, VLDL, LDLCALC  Physical Findings: AIMS: Facial and Oral Movements Muscles of Facial Expression: None, normal Lips and Perioral Area: None, normal Jaw: None, normal Tongue: None, normal,Extremity Movements Upper (arms, wrists, hands, fingers): None, normal Lower (legs, knees, ankles, toes): None, normal, Trunk Movements Neck, shoulders, hips: None, normal, Overall Severity Severity of abnormal movements (highest score from questions above): None, normal Incapacitation due to abnormal movements: None, normal Patient's awareness of abnormal movements (rate only patient's report): No Awareness, Dental Status Current problems with teeth and/or dentures?: No Does patient usually wear dentures?: Yes  CIWA:    COWS:     Musculoskeletal: Strength & Muscle Tone: within normal limits Gait & Station: normal Patient leans: N/A  Psychiatric Specialty Exam: Physical Exam  Nursing note and vitals  reviewed. Constitutional: She is oriented to person, place, and time. She appears well-developed and well-nourished.  Respiratory: Effort normal.  Musculoskeletal: Normal range of motion.  Neurological: She is alert and oriented to person, place, and time.  Skin: Skin is warm.    Review of Systems  Constitutional: Negative.   HENT: Negative.   Eyes: Negative.   Respiratory: Negative.   Cardiovascular: Negative.   Gastrointestinal: Negative.   Genitourinary: Negative.   Musculoskeletal: Negative.   Skin: Negative.   Neurological: Negative.   Endo/Heme/Allergies: Negative.  Psychiatric/Behavioral: Positive for depression. Negative for hallucinations and suicidal ideas.    Blood pressure (!) 148/91, pulse (!) 103, temperature 98.8 F (37.1 C), temperature source Oral, resp. rate 20, height 5\' 2"  (1.575 m), weight 59.9 kg (132 lb), last menstrual period 10/14/2002.Body mass index is 24.14 kg/m.  General Appearance: Casual  Eye Contact:  Good  Speech:  Pressured  Volume:  Normal  Mood:  Depressed  Affect:  Flat  Thought Process:  Goal Directed and Descriptions of Associations: Intact  Orientation:  Full (Time, Place, and Person)  Thought Content:  WDL  Suicidal Thoughts:  No  Homicidal Thoughts:  No  Memory:  Immediate;   Good Recent;   Good Remote;   Good  Judgement:  Fair  Insight:  Lacking  Psychomotor Activity:  Normal  Concentration:  Concentration: Good and Attention Span: Good  Recall:  Good  Fund of Knowledge:  Good  Language:  Good  Akathisia:  No  Handed:  Right  AIMS (if indicated):     Assets:  Communication Skills Desire for Improvement Financial Resources/Insurance Housing Physical Health Social Support Transportation  ADL's:  Intact  Cognition:  WNL  Sleep:  Number of Hours: 5.5   Problems Addressed: Bipolar  Treatment Plan Summary: Daily contact with patient to assess and evaluate symptoms and progress in treatment, Medication management and  Plan is to:  -Start Abilify 5 mg PO Daily for mood stability -Continue Ativan 0.5 mg PO Q6H PRN for anxiety -Encourage group therapy aprticipation   Lewis Shock, FNP 09/27/2017, 11:12 AM   Agree with NP Progress Note

## 2017-09-27 NOTE — BHH Group Notes (Signed)
[facility] Association Group Therapy 09/27/2017 1:15pm  Type of Therapy: Mental Health Association Presentation  Participation Level: Active  Participation Quality: Attentive  Affect: Appropriate  Cognitive: Oriented  Insight: Developing/Improving  Engagement in Therapy: Engaged  Modes of Intervention: Discussion, Education and Socialization  Summary of Progress/Problems: Mental Health Association ([place]) Speaker came to talk about his personal journey with living with a mental health diagnosis. The pt processed ways by which to relate to the speaker. [place] speaker provided handouts and educational information pertaining to groups and services offered by the [facility]. Pt was engaged in speaker's presentation and was receptive to resources provided.    Georga Kaufmann, MSW, LCSWA 09/27/2017 3:31 PM

## 2017-09-27 NOTE — BHH Counselor (Signed)
Adult Comprehensive Assessment  Patient ID: Destiny Elliott, female   DOB: 1951-08-16, 66 y.o.   MRN: [id number]  Information Source: Information source: Patient  Current Stressors:  Educational / Learning stressors: High school education  Employment / Job issues: Pt has had to take on less responsibility at work due to her mental health struggles  Family Relationships: None reported  Museum/gallery curator / Lack of resources (include bankruptcy): None reported  Housing / Lack of housing: None reported  Physical health (include injuries & life threatening diseases): None reported  Social relationships: Pt's marriage has been somewhat strained by pt's mental health concerns  Substance abuse: Pt denies use  Bereavement / Loss: Pt's son completed suicide 4 years ago  Living/Environment/Situation:  Living Arrangements: Spouse/significant other Living conditions (as described by patient or guardian): Pt lives with her husband  How long has patient lived in current situation?: 10 years  What is atmosphere in current home: Comfortable  Family History:  Marital status: Married Number of Years Married: 60 What types of issues is patient dealing with in the relationship?: Lack of sleep has made both of them more irritable, and pt's struggle with her mental health has also caused a strain in their relationship. But pt states that she loves her husband and they have a good life together.  Does patient have children?: Yes How many children?: 2(1 son, 1 daughter) How is patient's relationship with their children?: Pt's son completed suicide 4 years ago and this is still a struggle for the pt, pt reports having a great relationship with her daughter   Childhood History:  By whom was/is the patient raised?: Both parents, Mother Additional childhood history information: Pt's father died when she was 84 yo and after this pt was primarily raised by her mother  Description of patient's relationship with caregiver  when they were a child: Pt reports having a good relationship with her mother  Patient's description of current relationship with people who raised him/her: Pt's mother is now deceased  Does patient have siblings?: Yes Number of Siblings: 4(1 brother, 3 sisters) Description of patient's current relationship with siblings: Close relationship with all her sibling; pt states that she did have another older brother but he passed away Did patient suffer any verbal/emotional/physical/sexual abuse as a child?: No Did patient suffer from severe childhood neglect?: No Has patient ever been sexually abused/assaulted/raped as an adolescent or adult?: No Was the patient ever a victim of a crime or a disaster?: No Witnessed domestic violence?: No Has patient been effected by domestic violence as an adult?: No  Education:  Highest grade of school patient has completed: High school graduate  Currently a student?: No Learning disability?: No  Employment/Work Situation:   Employment situation: Employed 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  How long has patient been employed?: Since 1997  Patient's job has been impacted by current illness: Yes Describe how patient's job has been impacted: Pt's daughter has had to assume a lot of pt's responsiblities while pt is struggling with her mental health What is the longest time patient has a held a job?: Current job Where was the patient employed at that time?: Current job Has patient ever been in the [employer]?: No Has patient ever served in combat?: No Did You Receive Any Psychiatric Treatment/Services While in Passenger transport manager?: (NA) Are There Guns or Other Weapons in [place]?: No Are These Psychologist, educational?: (NA)  Financial Resources:   Financial resources: Income  from employment  Alcohol/Substance Abuse:   What has been your use of drugs/alcohol within the last 12 months?: Pt denies  Alcohol/Substance Abuse  Treatment Hx: Denies past history  Social Support System:   Heritage manager System: Manufacturing engineer System: Husband, daughter, siblings, some friends  Type of faith/religion: Darrick Meigs  How does patient's faith help to cope with current illness?: Attends church   Leisure/Recreation:   Leisure and Hobbies: Chief Financial Officer shopping, walking, reading books, jigsaw puzzles, spending time with her granddaughter   Strengths/Needs:   What things does the patient do well?: Bowling, staying active, taking care of others, Geneology  In what areas does patient struggle / problems for patient: Being more social  Discharge Plan:   Does patient have access to transportation?: Yes(Pt's husband will transport) Will patient be returning to same living situation after discharge?: Yes Currently receiving community mental health services: Yes (From Whom)([facility] therapist ) If no, would patient like referral for services when discharged?: No Does patient have financial barriers related to discharge medications?: No  Summary/Recommendations:     Patient is a 66 yo female who presented to the hospital with depression and passive SI. Pt's primary diagnosis is Major Depressive Disorder and Bipolar Disorder, unspecified type. Primary triggers for admission include increasing depression and coping with the loss of her son. During the time of the assessment pt was alert and oriented, pleasant and forthcoming with information. At certain times during the interaction pt was tearful. Pt is agreeable to continuing treatment on an outpatient basis upon discharge. Pt's supports include her husband, her daughter, her siblings, and some friends. Patient will benefit from crisis stabilization, medication evaluation, group therapy and pyschoeducation, in addition to case management for discharge planning. At discharge, it is recommended that pt remain compliant with the established discharge  plan and continue treatment.  Georga Kaufmann, MSW, Latanya Presser  09/27/2017

## 2017-09-27 NOTE — Progress Notes (Signed)
Patient ID: Destiny Elliott, female   DOB: Jul 03, 1951, 66 y.o.   MRN: [id number]  Patient reports feeling "very sedated." She states that she fell asleep in group and states, "I never do that." She reports that she is wanting to keep this medication (Abilify) in the morning. She states, "I don't want to take any medication at night. I like to sleep on my own." Patient was encouraged to allow herself some time to adjust on the medication. She is receptive to this. Q15 minute safety checks are maintained.

## 2017-09-27 NOTE — Progress Notes (Signed)
Recreation Therapy Notes  Date:  09/27/17 Time: 0930 Location: [address] Dayroom  Group Topic: Stress Management  Goal Area(s) Addresses:  Patient will verbalize importance of using healthy stress management.  Patient will identify positive emotions associated with healthy stress management.   Behavioral Response: Engaged  Intervention: Stress Management  Activity :  Guided Imagery.  LRT introduced the stress management technique of guided imagery.  LRT read a script to help patients envision their safe and peaceful place.  Patients were to follow along as LRT read script.  Education:  Stress Management, Discharge Planning.   Education Outcome: Acknowledges edcuation/In group clarification offered/Needs additional education  Clinical Observations/Feedback: Pt attended group.    Victorino Sparrow, LRT/CTRS         Victorino Sparrow A 09/27/2017 12:38 PM

## 2017-09-27 NOTE — Tx Team (Signed)
Interdisciplinary Treatment and Diagnostic Plan Update 09/27/2017 Time of Session: 9:30am  Destiny Elliott  MRN: [id number]  Principal Diagnosis: Bipolar disorder, unspecified ([place])  Secondary Diagnoses: Principal Problem:   Bipolar disorder, unspecified ([place])   Current Medications:  Current Facility-Administered Medications  Medication Dose Route Frequency Provider Last Rate Last Dose  . alum & mag hydroxide-simeth (MAALOX/MYLANTA) 200-200-20 MG/5ML suspension 30 mL  30 mL Oral Q4H PRN Patriciaann Clan E, PA-C      . ARIPiprazole (ABILIFY) tablet 5 mg  5 mg Oral Daily Money, Lowry Ram, FNP      . atenolol (TENORMIN) tablet 50 mg  50 mg Oral Daily Laverle Hobby, PA-C   50 mg at 09/27/17 0950  . LORazepam (ATIVAN) tablet 0.5 mg  0.5 mg Oral Q6H PRN Cobos, Myer Peer, MD   0.5 mg at 09/27/17 0817  . magnesium hydroxide (MILK OF MAGNESIA) suspension 30 mL  30 mL Oral Daily PRN Laverle Hobby, PA-C      . RHINASE GEL 1 application  1 application Nasal Daily PRN Money, Lowry Ram, FNP   1 application at 53/29/92 2154  . traZODone (DESYREL) tablet 50 mg  50 mg Oral QHS,MR X 1 Simon, Spencer E, PA-C        PTA Medications: Medications Prior to Admission  Medication Sig Dispense Refill Last Dose  . Aloe-Sodium Chloride (AYR SALINE NASAL GEL) SWAB Place 1 each daily as needed into the nose (for congestion).   Past Week at Unknown time  . ALPRAZolam (XANAX) 0.25 MG tablet Take 0.125-0.25 mg 2 (two) times daily as needed by mouth for anxiety or sleep.    Past Month at Unknown time  . atenolol (TENORMIN) 50 MG tablet Take 50 mg by mouth daily.    Past Week at Unknown time  . Nasal Moisturizer Combination (RHINASE) GEL Place 1 application daily as needed into the nose (for congestion).   09/25/2017 at Unknown time  . sodium chloride (OCEAN) 0.65 % SOLN nasal spray Place 1 spray into both nostrils daily as needed for congestion.   Past Week at Unknown time    Treatment Modalities: Medication  Management, Group therapy, Case management,  1 to 1 session with clinician, Psychoeducation, Recreational therapy.  Patient Stressors: Health problems Loss of son 4 years ago Medication change or noncompliance Patient Strengths: Ability for insight Average or above average intelligence Capable of independent living General fund of knowledge Motivation for treatment/growth Supportive family/friends  Physician Treatment Plan for Primary Diagnosis: Bipolar disorder, unspecified ([place]) Long Term Goal(s): Improvement in symptoms so as ready for discharge Short Term Goals: Ability to verbalize feelings will improve Ability to disclose and discuss suicidal ideas Ability to demonstrate self-control will improve Ability to identify and develop effective coping behaviors will improve Compliance with prescribed medications will improve  Medication Management: Evaluate patient's response, side effects, and tolerance of medication regimen.  Therapeutic Interventions: 1 to 1 sessions, Unit Group sessions and Medication administration.  Evaluation of Outcomes: Not Met  Physician Treatment Plan for Secondary Diagnosis: Principal Problem:   Bipolar disorder, unspecified ([place])  Long Term Goal(s): Improvement in symptoms so as ready for discharge  Short Term Goals: Ability to verbalize feelings will improve Ability to disclose and discuss suicidal ideas Ability to demonstrate self-control will improve Ability to identify and develop effective coping behaviors will improve Compliance with prescribed medications will improve  Medication Management: Evaluate patient's response, side effects, and tolerance of medication regimen.  Therapeutic Interventions: 1 to 1 sessions,  Unit Group sessions and Medication administration.  Evaluation of Outcomes: Not Met  RN Treatment Plan for Primary Diagnosis: Bipolar disorder, unspecified ([place]) Long Term Goal(s): Knowledge of disease and therapeutic regimen  to maintain health will improve  Short Term Goals: Compliance with prescribed medications will improve  Medication Management: RN will administer medications as ordered by provider, will assess and evaluate patient's response and provide education to patient for prescribed medication. RN will report any adverse and/or side effects to prescribing provider.  Therapeutic Interventions: 1 on 1 counseling sessions, Psychoeducation, Medication administration, Evaluate responses to treatment, Monitor vital signs and CBGs as ordered, Perform/monitor CIWA, COWS, AIMS and Fall Risk screenings as ordered, Perform wound care treatments as ordered.  Evaluation of Outcomes: Not Met  LCSW Treatment Plan for Primary Diagnosis: Bipolar disorder, unspecified ([place]) Long Term Goal(s): Safe transition to appropriate next level of care at discharge, Engage patient in therapeutic group addressing interpersonal concerns. Short Term Goals: Engage patient in aftercare planning with referrals and resources, Increase emotional regulation, Identify triggers associated with mental health/substance abuse issues and Increase skills for wellness and recovery  Therapeutic Interventions: Assess for all discharge needs, 1 to 1 time with Social worker, Explore available resources and support systems, Assess for adequacy in community support network, Educate family and significant other(s) on suicide prevention, Complete Psychosocial Assessment, Interpersonal group therapy.  Evaluation of Outcomes: Not Met  Progress in Treatment: Attending groups: Yes Participating in groups: Yes Taking medication as prescribed: Yes, MD continues to assess for medication changes as needed Toleration medication: Yes, no side effects reported at this time Family/Significant other contact made: No, CSW assessing for appropriate contact Patient understands diagnosis: Continuing to assess Discussing patient identified problems/goals with staff:  Yes Medical problems stabilized or resolved: Yes Denies suicidal/homicidal ideation: No, pt recently admitted with SI. Issues/concerns per patient self-inventory: None Other: N/A  New problem(s) identified: None identified at this time.   New Short Term/Long Term Goal(s): None identified at this time.   Discharge Plan or Barriers: CSW still assessing for an appropriate plan.  Reason for Continuation of Hospitalization:  Anxiety  Depression Medication stabilization Suicidal ideation  Estimated Length of Stay: 3-5 days; Estimated discharge date 10/02/17  Attendees: Patient: 09/27/2017 11:33 AM  Physician: Dr. Parke Poisson 09/27/2017 11:33 AM  Nursing: Trinna Post RN; Chong Sicilian, RN 09/27/2017 11:33 AM  RN Care Manager: Lars Pinks, RN 09/27/2017 11:33 AM  Social Worker: Matthew Saras, [place] 09/27/2017 11:33 AM  Recreational Therapist:  09/27/2017 11:33 AM  Other: Lindell Spar, NP 09/27/2017 11:33 AM  Other:  09/27/2017 11:33 AM  Other: 09/27/2017 11:33 AM  Scribe for Treatment Team: Georga Kaufmann, MSW,LCSWA 09/27/2017 11:33 AM

## 2017-09-27 NOTE — Progress Notes (Signed)
D. " Destiny Elliott" ( as she requests this writer call her ) remains stuck....she is ruminating about her " dry sinuses" and is having diff dealing with her anxiety and feelings related to this. She says the same thing over and over. She repeats the same thought,,,just uses different words each time to word her questions / statement. She is very anxious and demonstrates no insight into her feelings and disease as evidenced by the statement: "  A She was gven prn ativan 0.5 mg for ativan ( she stated this did not help any) and then she took 5 mg abilify po. She remains quite pressured...anxious and hypomanic and confused. She has the same behaviors she says " I can't stop myself . R Pt completed her daily assessment and on this she wrote swhe dneid SI today and she rated her dperession, hopelessness and anxeity " 3/0/6", resepctively. R Safety is in plae.

## 2017-09-27 NOTE — Progress Notes (Signed)
Nursing Progress Note: 7p-7a D: Pt currently presents with a pleasant/childlike/anxious affect and behavior. Pt states "I feel better today. My meds I'm on now is making me feel sedated. I like to be tired but only when I am trying to sleep." Interacting appropriately with the milieu. Pt reports good sleep during the previous night with current medication regimen. Pt did attend wrap-up group.  A: Pt provided with medications per providers orders. Pt's labs and vitals were monitored throughout the night. Pt supported emotionally and encouraged to express concerns and questions. Pt educated on medications.  R: Pt's safety ensured with 15 minute and environmental checks. Pt currently denies SI, HI, and AVH. Pt verbally contracts to seek staff if SI,HI, or AVH occurs and to consult with staff before acting on any harmful thoughts. Will continue to monitor.

## 2017-09-28 MED ORDER — ARIPIPRAZOLE 2 MG PO TABS
2.0000 mg | ORAL_TABLET | Freq: Every day | ORAL | Status: DC
  Administered 2017-09-28 – 2017-09-30 (×3): 2 mg via ORAL
  Filled 2017-09-28 (×5): qty 1

## 2017-09-28 NOTE — Progress Notes (Signed)
Pt asked for a second dose of Abilify 2 mg. Write notified Dr. Parke Poisson to obtain order. Per MD, pt may have a second dose of Abilify 2 mg. Write called pt up to the nurses station to make pt aware that the MD was agreeable to her having a second dose. Pt had changed her mind and declined. No order placed.

## 2017-09-28 NOTE — Progress Notes (Signed)
Nursing Progress Note: 7p-7a D: Pt currently presents with a anxious/pleasant/tangential/concrete affect and behavior. Pt states "I just can't get over these sinus issues. The abilify has hurt them. I'm sure." Interacting appropriately with the milieu. Pt reports good sleep during the previous night with current medication regimen. Pt did attend wrap-up group.  A: Pt provided with medications per providers orders. Pt's labs and vitals were monitored throughout the night. Pt supported emotionally and encouraged to express concerns and questions. Pt educated on medications.  R: Pt's safety ensured with 15 minute and environmental checks. Pt currently denies SI, HI, and AVH. Pt verbally contracts to seek staff if SI,HI, or AVH occurs and to consult with staff before acting on any harmful thoughts. Will continue to monitor.

## 2017-09-28 NOTE — Progress Notes (Signed)
[facility] MD Progress Note  09/28/2017 8:48 AM Destiny Elliott  MRN:  [id number]   Subjective: patient reports feeling " OK", and states she does feel better than on admission. Denies suicidal ideations. She ruminates about medications/doses and possible side effects.  Presents with somatic concerns regarding dry nasal passages.   Objective: Patient's chart and findings reviewed and discussed with treatment team. Staff reports patient presents anxious, worrying about medications, seeking frequent reassurance and support regarding medication issues . She is currently on Abilify 5 mgrs QDAY started for mood disorder. She states she worries dose may be too high and requests taking Abilify at 2 or 4 mgrs today in order to minimize risk of side effects.  Responds partially to reassurance and support, review of medication side effects and rationale. With patient's express consent I spoke with her adult daughter, who has visited patient on unit. Daughter reports mother has " always been hyper" and anxious, but that mood issues have  become more noticeable since her son passed away ( he committed suicide) 4 years ago. Daughter feels patient and family need additional grief counseling to continue to address this loss .  Regarding events that led to admission, patient states " I was not trying to kill myself, I just wanted my husband's attention ".  Denies any suicidal ideations and is future oriented . Of note, patient is alert, oriented x 3, recall is 3/3 immediate and 2/3 at 5 minutes .      Principal Problem: Bipolar disorder, unspecified ([place]) Diagnosis:   Patient Active Problem List   Diagnosis Date Noted  . Bipolar disorder, unspecified ([place]) [F31.9] 09/26/2017   Total Time spent with patient: 20 minutes  Past Psychiatric History: See H&P  Past Medical History:  Past Medical History:  Diagnosis Date  . Abnormal Pap smear 1994   --CIN I  . Allergic rhinitis   . Anxiety   . Dense breasts   .  Endocervical polyp 01/1993   -benign  . Hypercholesterolemia   . Hypertension   . Osteopenia     Past Surgical History:  Procedure Laterality Date  . CERVICAL BIOPSY  W/ LOOP ELECTRODE EXCISION  03/1993   CIN I  . COLONOSCOPY  2016  . endocervical polyp  01-27-93   -benign  . TOOTH EXTRACTION  08/2017   Family History:  Family History  Problem Relation Age of Onset  . Stroke Father 76  . Kidney disease Father   . Hypertension Father   . Hypertension Sister 79  . Hyperlipidemia Sister   . COPD Sister   . Breast cancer Sister 39  . Thyroid disease Mother 43  . Heart failure Mother   . Heart disease Mother   . Hypertension Mother   . COPD Mother   . Heart disease Brother 37       AFIB  . Suicidality Son 39       07/20/2013  . Thyroid disease Sister 38   Family Psychiatric  History: See H&P Social History:  Social History   Substance and Sexual Activity  Alcohol Use Yes   Comment: occ wine     Social History   Substance and Sexual Activity  Drug Use No    Social History   Socioeconomic History  . Marital status: Married    Spouse name: Destiny Elliott  . Number of children: 2  . Years of education: 27  . Highest education level: None  Social Needs  . Financial resource strain: None  . Food  insecurity - worry: None  . Food insecurity - inability: None  . Transportation needs - medical: None  . Transportation needs - non-medical: None  Occupational History  . 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 Smoking status: Former Smoker    Packs/day: 0.50    Years: 28.00    Pack years: 14.00    Types: Cigarettes    Last attempt to quit: 07/01/2004    Years since quitting: 13.2  . Smokeless tobacco: Never Used  Substance and Sexual Activity  . Alcohol use: Yes    Comment: occ wine  . Drug use: No  . Sexual activity: Yes    Partners: Male    Birth control/protection: Post-menopausal  Other Topics Concern  . None  Social History  Narrative   Lives with Destiny Elliott, one child deceased   Caffeine- 2 cups of tea   Additional Social History:   Sleep: Good  Appetite:  Good  Current Medications: Current Facility-Administered Medications  Medication Dose Route Frequency Provider Last Rate Last Dose  . alum & mag hydroxide-simeth (MAALOX/MYLANTA) 200-200-20 MG/5ML suspension 30 mL  30 mL Oral Q4H PRN Patriciaann Clan E, PA-C      . ARIPiprazole (ABILIFY) tablet 5 mg  5 mg Oral Daily Money, Lowry Ram, FNP   5 mg at 09/27/17 1330  . atenolol (TENORMIN) tablet 50 mg  50 mg Oral Daily Patriciaann Clan E, PA-C   50 mg at 09/28/17 7893  . LORazepam (ATIVAN) tablet 0.5 mg  0.5 mg Oral Q6H PRN Kaylynne Andres, Myer Peer, MD   0.5 mg at 09/27/17 0817  . magnesium hydroxide (MILK OF MAGNESIA) suspension 30 mL  30 mL Oral Daily PRN Laverle Hobby, PA-C      . RHINASE GEL 1 application  1 application Nasal Daily PRN Money, Lowry Ram, FNP   1 application at 81/01/75 2154  . traZODone (DESYREL) tablet 50 mg  50 mg Oral QHS,MR X 1 Laverle Hobby, PA-C        Lab Results:  Results for orders placed or performed during the hospital encounter of 09/26/17 (from the past 48 hour(s))  TSH     Status: None   Collection Time: 09/27/17  8:03 AM  Result Value Ref Range   TSH 1.875 0.350 - 4.500 uIU/mL    Comment: Performed by a 3rd Generation assay with a functional sensitivity of <=0.01 uIU/mL. Performed at [facility], [place] [address] [place], [place] [address]     Blood Alcohol level:  Lab Results  Component Value Date   ETH <10 09/25/2017   ETH <5 52/77/8242    Metabolic Disorder Labs: No results found for: HGBA1C, MPG No results found for: PROLACTIN No results found for: CHOL, TRIG, HDL, CHOLHDL, VLDL, LDLCALC  Physical Findings: AIMS: Facial and Oral Movements Muscles of Facial Expression: None, normal Lips and Perioral Area: None, normal Jaw: None, normal Tongue: None, normal,Extremity Movements Upper (arms,  wrists, hands, fingers): None, normal Lower (legs, knees, ankles, toes): None, normal, Trunk Movements Neck, shoulders, hips: None, normal, Overall Severity Severity of abnormal movements (highest score from questions above): None, normal Incapacitation due to abnormal movements: None, normal Patient's awareness of abnormal movements (rate only patient's report): No Awareness, Dental Status Current problems with teeth and/or dentures?: No Does patient usually wear dentures?: Yes  CIWA:    COWS:     Musculoskeletal: Strength & Muscle Tone: within normal limits Gait & Station: normal Patient leans:  N/A  Psychiatric Specialty Exam: Physical Exam  Nursing note and vitals reviewed. Constitutional: She is oriented to person, place, and time. She appears well-developed and well-nourished.  Respiratory: Effort normal.  Musculoskeletal: Normal range of motion.  Neurological: She is alert and oriented to person, place, and time.  Skin: Skin is warm.    Review of Systems  Constitutional: Negative.   HENT: Negative.   Eyes: Negative.   Respiratory: Negative.   Cardiovascular: Negative.   Gastrointestinal: Negative.   Genitourinary: Negative.   Musculoskeletal: Negative.   Skin: Negative.   Neurological: Negative.   Endo/Heme/Allergies: Negative.   Psychiatric/Behavioral: Positive for depression. Negative for hallucinations and suicidal ideas.  denies headache, no chest pain , no shortness of breath, no vomiting , reports nasal dryness as a chronic issue   Blood pressure 125/74, pulse 81, temperature 99.1 F (37.3 C), temperature source Oral, resp. rate 16, height 5\' 2"  (1.575 m), weight 59.9 kg (132 lb), last menstrual period 10/14/2002.Body mass index is 24.14 kg/m.  General Appearance: improved grooming   Eye Contact:  Good  Speech:  Normal Rate- less pressured today  Volume:  Normal  Mood:  denies feeling depressed, presents anxious   Affect:  anxious   Thought Process:  Goal  Directed and Descriptions of Associations: Intact  Orientation:  Full (Time, Place, and Person)  Thought Content:  no hallucinations, no delusions , not internally preoccupied  Suicidal Thoughts:  No- denies suicidal or self injurious ideations, denies homicidal or violent ideations, contracts for safety on unit   Homicidal Thoughts:  No  Memory:  Recent and remote grossly intact   Judgement:  Fair  Insight:  Lacking  Psychomotor Activity:  Normal  Concentration:  Concentration: Good and Attention Span: Good  Recall:  Good  Fund of Knowledge:  Good  Language:  Good  Akathisia:  No  Handed:  Right  AIMS (if indicated):     Assets:  Communication Skills Desire for Improvement Financial Resources/Insurance Housing Physical Health Social Support Transportation  ADL's:  Intact  Cognition:  WNL  Sleep:  Number of Hours: 5.75   Assessment - patient reports history of activation and agitation on several different antidepressants , presents with anxiety and pressured speech at times . Daughter describes patient as " high energy", hyperthymic. Patient denies history of distinct manic or hypomanic episodes . Report is that mood symptoms have become more noticeable since son passed away 3-4 years ago. Currently on Abilify trial. Presents anxious and ruminative about side effects, and prefers to start at lower dose initially .   Treatment Plan Summary: Treatment team reviewed as below today 11/15  Daily contact with patient to assess and evaluate symptoms and progress in treatment, Medication management and Plan is to:  -Encourage group and  Milieu participation to work on coping skills and symptom reduction  -Change Abilify to 2  mg daily for mood disorder- titrate gradually as tolerated  -Continue Ativan 0.5 mg PO Q6H PRN for anxiety -Treatment team working on disposition planning options   Jenne Campus, MD 09/28/2017, 8:48 AM   Patient ID: Sharyn Creamer, female   DOB: [birth year],  66 y.o.   MRN: [id number]

## 2017-09-28 NOTE — Progress Notes (Signed)
D: Pt presents with an animated affect and anxious mood. Pt noted to be fidgety this morning and constantly worrying. Pt verbalized to writer that the Ability 5 mg made her sedated, lethargic and sleepy after taking it yesterday. Pt requested to take only half of the medication. Writer made Dr. Parke Poisson aware. Pt then became anxious about seeing the doctor and had to be reminded several times this morning that the doctor was currently rounding and would see her today. After the order was modified by the MD to 2 mg. Pt then went back and forth with writer as to whether or not she should go ahead and take 5 mg or 2 mg. Writer explained to pt that he doctor had decreased her Abilify to 2 mg as requested. Pt then went back and forth as to whether or not it's too late to take the Abilify. Writer encouraged pt to take the Abilify 2 mg as ordered to help decrease her racing thoughts. Pt also appears to have some mild confusion AEB repeatedly asking the same questions over and over. A: Orders reviewed with pt. Mediations administered as ordered per MD. Verbal support provided. Pt encouraged to attend groups. Pt redirected as needed. 15 minute checks performed for safety.  R: Pt compliant with tx.

## 2017-09-29 NOTE — Progress Notes (Signed)
  [facility] Adult Case Management Discharge Plan :  Will you be returning to the same living situation after discharge:  Yes,  pt returning home. At discharge, do you have transportation home?: Yes,  pt's husband or daughter will transport. Do you have the ability to pay for your medications: Yes,  pt has insurance.  Release of information consent forms completed and in the chart;  Patient's signature needed at discharge.  Patient to Follow up at: [facility].   Why:  Social worker has left a message with the receptionist but was unable to get a call back to schedule your appointment. Please call the office upon discharge to be scheduled for a therapy appointment with Mikki Santee. Contact information:  [address] [place], [place] [address] P: [phone] F: [place], [facility] Follow up on 10/12/2017.   Specialty:  Behavioral Health Why:  Medication management appointment 11/29 at 9:45am with Dr. Judithann Graves. Please call the office if you need to cancel or reschedule your appointment. Contact information: [place] [place] [address] [phone]           Next level of care provider has access to [place] and Suicide Prevention discussed: Yes,  with pt..  Have you used any form of tobacco in the last 30 days? (Cigarettes, Smokeless Tobacco, Cigars, and/or Pipes): No  Has patient been referred to the Quitline?: N/A patient is not a smoker  Patient has been referred for addiction treatment: [place], MSW, LCSWA 09/29/2017, 3:37 PM

## 2017-09-29 NOTE — Plan of Care (Signed)
Patient remains anxious however is lessening from admission.  Patient naming outside supports she will utilize upon discharge.

## 2017-09-29 NOTE — B[facility] Group Notes (Signed)
LCSW Group Therapy Note  09/29/2017 1:15pm  Type of Therapy and Topic:  Group Therapy:  Feelings around Relapse and Recovery  Participation Level: Active   Description of Group:    Patients in this group will discuss emotions they experience before and after a relapse. They will process how experiencing these feelings, or avoidance of experiencing them, relates to having a relapse. Facilitator will guide patients to explore emotions they have related to recovery. Patients will be encouraged to process which emotions are more powerful. They will be guided to discuss the emotional reaction significant others in their lives may have to their relapse or recovery. Patients will be assisted in exploring ways to respond to the emotions of others without this contributing to a relapse.  Therapeutic Goals: 1. Patient will identify two or more emotions that lead to a relapse for them 2. Patient will identify two emotions that result when they relapse 3. Patient will identify two emotions related to recovery 4. Patient will demonstrate ability to communicate their needs through discussion and/or role plays   Therapeutic Modalities:   Cognitive Behavioral Therapy Solution-Focused Therapy Assertiveness Training Relapse Prevention Therapy   Georga Kaufmann, MSW, [facility] 09/29/2017 3:41 PM

## 2017-09-29 NOTE — Plan of Care (Signed)
  Progressing Safety: Periods of time without injury will increase 09/29/2017 2257 - Progressing by Karie Kirks, RN Note Pt has not harmed self or others tonight.  She denies SI/HI and verbally contracts for safety.

## 2017-09-29 NOTE — Progress Notes (Signed)
D: Pt was in the dayroom upon initial approach.  Pt presents with anxious affect and mood.  She reports her day has "been good, I was a little bummed when everybody was leaving."  Pt was referring to other patients discharging earlier.  She reports she felt better after she "played some cards, walked, talked."  Pt states "I think he's going to release me tomorrow."  Reports she feels safe to discharge tomorrow and she is "looking forward to it."  Pt denies SI/HI, denies hallucinations, denies pain.  Pt has been visible in milieu interacting with peers and staff appropriately.  Pt attended evening group.    A: Introduced self to pt.  Actively listened to pt and offered support and encouragement. Medication offered per order.  Q15 minute safety checks maintained.  R: Pt is safe on the unit.  Pt refused scheduled Trazodone.  Pt verbally contracts for safety.  Will continue to monitor and assess.

## 2017-09-29 NOTE — Progress Notes (Signed)
[facility] MD Progress Note  09/29/2017 12:59 PM Destiny Elliott  MRN:  [id number]   Subjective: reports partial improvement , and at this time presents less focused on medication doses or possible side effects. Today spoke about her ongoing feelings of loss and grief regarding the death of her son. Denies any history of mania but states she normally is a very active person, and describes episodic subjective feeling of racing thoughts.  Denies suicidal ideations.  Objective: Patient's chart and findings reviewed and discussed with treatment team. Patient presents with some improvement compared to admission. Remains vaguely depressed, and was tearful at times during session, during which today she spoke about the loss of her son a few years back. Denies suicidal ideations.  Acknowledges some anxiety, but does appear less so than recently, and in particular seems less focused and less ruminative about medication issues, and today expresses hope that Abilify will help her " feel more like myself again".  States she did take Abilify 5 mgrs today and thus far has had no side effects. No akathisia is noted .  Patient denies history of mania, but has history of not doing well and becoming more subjectively agitated and  activated with different antidepressant trials. Today spoke about her tendency to worry , have somatic concerns, which over recent days has centered around dry nostrils, sinuses . Denies suicidal ideations . No disruptive or agitated behaviors on unit     Principal Problem: Bipolar disorder, unspecified ([place]) Diagnosis:   Patient Active Problem List   Diagnosis Date Noted  . Bipolar disorder, unspecified ([place]) [F31.9] 09/26/2017   Total Time spent with patient: 25 minutes   Past Psychiatric History: See H&P  Past Medical History:  Past Medical History:  Diagnosis Date  . Abnormal Pap smear 1994   --CIN I  . Allergic rhinitis   . Anxiety   . Dense breasts   . Endocervical polyp  01/1993   -benign  . Hypercholesterolemia   . Hypertension   . Osteopenia     Past Surgical History:  Procedure Laterality Date  . CERVICAL BIOPSY  W/ LOOP ELECTRODE EXCISION  03/1993   CIN I  . COLONOSCOPY  2016  . endocervical polyp  01-27-93   -benign  . TOOTH EXTRACTION  08/2017   Family History:  Family History  Problem Relation Age of Onset  . Stroke Father 40  . Kidney disease Father   . Hypertension Father   . Hypertension Sister 58  . Hyperlipidemia Sister   . COPD Sister   . Breast cancer Sister 70  . Thyroid disease Mother 30  . Heart failure Mother   . Heart disease Mother   . Hypertension Mother   . COPD Mother   . Heart disease Brother 87       AFIB  . Suicidality Son 39       07/20/2013  . Thyroid disease Sister 56   Family Psychiatric  History: See H&P Social History:  Social History   Substance and Sexual Activity  Alcohol Use Yes   Comment: occ wine     Social History   Substance and Sexual Activity  Drug Use No    Social History   Socioeconomic History  . Marital status: Married    Spouse name: Sherren Mocha  . Number of children: 2  . Years of education: 57  . Highest education level: None  Social Needs  . Financial resource strain: None  . Food insecurity - worry: None  .  Food insecurity - inability: None  . Transportation needs - medical: None  . Transportation needs - non-medical: None  Occupational History  . 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 Smoking status: Former Smoker    Packs/day: 0.50    Years: 28.00    Pack years: 14.00    Types: Cigarettes    Last attempt to quit: 07/01/2004    Years since quitting: 13.2  . Smokeless tobacco: Never Used  Substance and Sexual Activity  . Alcohol use: Yes    Comment: occ wine  . Drug use: No  . Sexual activity: Yes    Partners: Male    Birth control/protection: Post-menopausal  Other Topics Concern  . None  Social History Narrative   Lives with  Sherren Mocha, one child deceased   Caffeine- 2 cups of tea   Additional Social History:   Sleep: Good  Appetite:  Good  Current Medications: Current Facility-Administered Medications  Medication Dose Route Frequency Provider Last Rate Last Dose  . alum & mag hydroxide-simeth (MAALOX/MYLANTA) 200-200-20 MG/5ML suspension 30 mL  30 mL Oral Q4H PRN Patriciaann Clan E, PA-C      . ARIPiprazole (ABILIFY) tablet 2 mg  2 mg Oral Daily Galileah Piggee A, MD   2 mg at 09/29/17 0820  . atenolol (TENORMIN) tablet 50 mg  50 mg Oral Daily Patriciaann Clan E, PA-C   50 mg at 09/29/17 0820  . LORazepam (ATIVAN) tablet 0.5 mg  0.5 mg Oral Q6H PRN Tina Gruner, Myer Peer, MD   0.5 mg at 09/27/17 0817  . magnesium hydroxide (MILK OF MAGNESIA) suspension 30 mL  30 mL Oral Daily PRN Patriciaann Clan E, PA-C      . RHINASE GEL 1 application  1 application Nasal Daily PRN Money, Lowry Ram, FNP   1 application at 95/62/13 [phone]  . traZODone (DESYREL) tablet 50 mg  50 mg Oral QHS,MR X 1 Simon, Spencer E, PA-C        Lab Results:  No results found for this or any previous visit (from the past 48 hour(s)).  Blood Alcohol level:  Lab Results  Component Value Date   ETH <10 09/25/2017   ETH <5 78/46/9629    Metabolic Disorder Labs: No results found for: HGBA1C, MPG No results found for: PROLACTIN No results found for: CHOL, TRIG, HDL, CHOLHDL, VLDL, LDLCALC  Physical Findings: AIMS: Facial and Oral Movements Muscles of Facial Expression: None, normal Lips and Perioral Area: None, normal Jaw: None, normal Tongue: None, normal,Extremity Movements Upper (arms, wrists, hands, fingers): None, normal Lower (legs, knees, ankles, toes): None, normal, Trunk Movements Neck, shoulders, hips: None, normal, Overall Severity Severity of abnormal movements (highest score from questions above): None, normal Incapacitation due to abnormal movements: None, normal Patient's awareness of abnormal movements (rate only patient's report): No  Awareness, Dental Status Current problems with teeth and/or dentures?: No Does patient usually wear dentures?: Yes  CIWA:    COWS:     Musculoskeletal: Strength & Muscle Tone: within normal limits Gait & Station: normal Patient leans: N/A  Psychiatric Specialty Exam: Physical Exam  Nursing note and vitals reviewed. Constitutional: She is oriented to person, place, and time. She appears well-developed and well-nourished.  Respiratory: Effort normal.  Musculoskeletal: Normal range of motion.  Neurological: She is alert and oriented to person, place, and time.  Skin: Skin is warm.    Review of Systems  Constitutional: Negative.   HENT: Negative.   Eyes: Negative.  Respiratory: Negative.   Cardiovascular: Negative.   Gastrointestinal: Negative.   Genitourinary: Negative.   Musculoskeletal: Negative.   Skin: Negative.   Neurological: Negative.   Endo/Heme/Allergies: Negative.   Psychiatric/Behavioral: Positive for depression. Negative for hallucinations and suicidal ideas.  denies headache, no chest pain , no shortness of breath, no vomiting , reports nasal dryness as a chronic issue   Blood pressure 129/78, pulse 71, temperature 98.9 F (37.2 C), temperature source Oral, resp. rate 16, height 5\' 2"  (1.575 m), weight 59.9 kg (132 lb), last menstrual period 10/14/2002.Body mass index is 24.14 kg/m.  General Appearance: Well Groomed  Eye Contact:  Good  Speech:  Normal Rate, no pressured speech noted   Volume:  Normal  Mood:  acknowledges some lingering depression, anxiety   Affect:  improved overall, but presents vaguely labile and intermittently tearful when discussing loss of loved one  Thought Process:  Goal Directed and Descriptions of Associations: Intact- no racing thoughts endorsed or noted.  Orientation:  Full (Time, Place, and Person)  Thought Content:  no hallucinations, no delusions , not internally preoccupied  Suicidal Thoughts:  No- denies suicidal or self  injurious ideations, denies homicidal or violent ideations, contracts for safety on unit   Homicidal Thoughts:  No  Memory:  Recent and remote grossly intact   Judgement:  Other:  improving  Insight:  Fair and improving   Psychomotor Activity:  Normal  Concentration:  Concentration: Good and Attention Span: Good  Recall:  Good  Fund of Knowledge:  Good  Language:  Good  Akathisia:  No  Handed:  Right  AIMS (if indicated):     Assets:  Communication Skills Desire for Improvement Financial Resources/Insurance Housing Physical Health Social Support Transportation  ADL's:  Intact  Cognition:  WNL  Sleep:  Number of Hours: 5.75   Assessment - patient presents vaguely depressed, anxious, but denies any suicidal ideations and is future oriented . Today seems less significantly somatically focused and less ruminative about psychiatric medication trial and potential side effects. This AM did take Abilify with less concern than yesterday. Denies side effects thus far.  Continues to report sense of grief and loss related to death of her son.   Treatment Plan Summary: Treatment team reviewed as below today 11/16 Daily contact with patient to assess and evaluate symptoms and progress in treatment, Medication management and Plan is to:  -Encourage group and  Milieu participation to work on coping skills and symptom reduction  -Continue  Abilify 5  mg daily for mood disorder -Continue Ativan 0.5 mg PO Q6H PRN for anxiety -Treatment team working on disposition planning options   Jenne Campus, MD 09/29/2017, 12:59 PM   Patient ID: Sharyn Creamer, female   DOB: [birth year], 66 y.o.   MRN: [id number]

## 2017-09-29 NOTE — Progress Notes (Signed)
  [facility] Adult Case Management Discharge Plan :  Will you be returning to the same living situation after discharge:  Yes,  pt returning home. At discharge, do you have transportation home?: Yes,  pt has access to transportation. Do you have the ability to pay for your medications: Yes,  pt has insurance.  Release of information consent forms completed and in the chart;  Patient's signature needed at discharge.  Patient to Follow up at: [facility].   Why:  Social worker has left a message with the receptionist but was unable to get a call back to schedule your appointment. Please call the office upon discharge to be scheduled for a therapy appointment with Mikki Santee. Contact information:  [address] [place], [place] [address] P: [phone] F: [place], [facility] Follow up on 10/12/2017.   Specialty:  Behavioral Health Why:  Medication management appointment 11/29 at 9:45am with Dr. Judithann Graves. Please call the office if you need to cancel or reschedule your appointment. Contact information: [address] [place] [address] [phone]           Next level of care provider has access to [place] and Suicide Prevention discussed: Yes,  with pt.  Have you used any form of tobacco in the last 30 days? (Cigarettes, Smokeless Tobacco, Cigars, and/or Pipes): No  Has patient been referred to the Quitline?: N/A patient is not a smoker  Patient has been referred for addiction treatment: [place], MSW, LCSWA 09/29/2017, 3:31 PM

## 2017-09-29 NOTE — Progress Notes (Signed)
Recreation Therapy Notes  Date: 09/29/17 Time: 0930 Location: [address] Dayroom  Group Topic: Stress Management  Goal Area(s) Addresses:  Patient will verbalize importance of using healthy stress management.  Patient will identify positive emotions associated with healthy stress management.   Intervention: Stress Management  Activity :  Authentic Self Meditation.  LRT introduced the stress management technique of meditation.  LRT lead patients in a meditation that allowed them to explore the things that make them who they are.  Education:  Stress Management, Discharge Planning.   Education Outcome: Acknowledges edcuation/In group clarification offered/Needs additional education  Clinical Observations/Feedback: Pt did not attend group.    Victorino Sparrow, LRT/CTRS         Victorino Sparrow A 09/29/2017 12:31 PM

## 2017-09-29 NOTE — Progress Notes (Signed)
D: Patient observed up and visible on unit. Tentative and cautious when approached though does express frequent needs. Requires frequent reassurance. Displays ruminative thinking and states this is common for her. "I'd like to get to a place where this isn't happening but I don't know. It's not depression. It's anxiety. I don't want to hurt myself." Patient's affect anxious, worried with congruent mood. Per self inventory and discussions with writer, rates depression at a 3/10, hopelessness at a 0/10 and anxiety at a 4/10. Rates sleep as good, appetite as good, energy as normal and concentration as good.  States goal for today is "coping skills, stop worrying about everything. Try to be positive, practice my coping skills." Denies pain however continues to complain frequently about dry sinuses. States she has had extensive workups however no reason for this has been identified.   A: Medicated per orders, prn rhinasel given for complaint. Level III obs in place for safety. Emotional support offered and self inventory reviewed. Encouraged completion of Suicide Safety Plan and programming participation. Discussed POC with MD, SW.  Fall prevention plan in place and reviewed with patient as pt is a moderate fall risk due to age.   R: Patient verbalizes understanding of POC, falls prevention education. On reassess, patient reports some relief. Patient denies SI/HI/AVH and remains safe on level III obs. Will continue to monitor closely and make verbal contact frequently.

## 2017-09-30 MED ORDER — ARIPIPRAZOLE 5 MG PO TABS: 5.0000 mg | ORAL_TABLET | Freq: Every day | ORAL | 0 refills | Status: DC

## 2017-09-30 MED ORDER — LORAZEPAM 0.5 MG PO TABS: 0.5000 mg | ORAL_TABLET | Freq: Four times a day (QID) | ORAL | 0 refills | Status: DC | PRN

## 2017-09-30 MED ORDER — TRAZODONE HCL 50 MG PO TABS: 50.0000 mg | ORAL_TABLET | Freq: Every evening | ORAL | 0 refills | Status: DC | PRN

## 2017-09-30 MED ORDER — ARIPIPRAZOLE 5 MG PO TABS
5.0000 mg | ORAL_TABLET | Freq: Every day | ORAL | Status: DC
  Filled 2017-09-30 (×2): qty 1

## 2017-09-30 NOTE — BHH Suicide Risk Assessment (Signed)
[facility] Discharge Suicide Risk Assessment   Principal Problem: Depression, Anxiety Discharge Diagnoses:  Patient Active Problem List   Diagnosis Date Noted  . Bipolar disorder, unspecified ([place]) [F31.9] 09/26/2017    Total Time spent with patient: 30 minutes  Musculoskeletal: Strength & Muscle Tone: within normal limits Gait & Station: normal Patient leans: N/A  Psychiatric Specialty Exam: ROS denies headache, reports chronic dry nasal passages and sinuses , denies chest pain, no dyspnea  Blood pressure 121/76, pulse 64, temperature 99.3 F (37.4 C), temperature source Oral, resp. rate 16, height 5\' 2"  (1.575 m), weight 59.9 kg (132 lb), last menstrual period 10/14/2002.Body mass index is 24.14 kg/m.  General Appearance: Well Groomed  Eye Contact::  Good  Speech:  Normal Rate409  Volume:  Normal  Mood:  improving mood , currently states she is feeling significantly better than prior to admission   Affect:  less anxious today, reactive  Thought Process:  Linear and Descriptions of Associations: Intact  Orientation:  Other:  fully alert and attentive   Thought Content:  denies hallucinations, no delusions are expressed  Suicidal Thoughts:  No denies suicidal or self injurious ideations, denies homicidal or violent ideations   Homicidal Thoughts:  No  Memory:  recent and remote grossly intact   Judgement:  Other:  improving   Insight:  Fair- improving   Psychomotor Activity:  Normal  Concentration:  Good  Recall:  Good  Fund of Knowledge:Good  Language: Good  Akathisia:  Negative  Handed:  Right  AIMS (if indicated):     Assets:  Communication Skills Desire for Improvement Resilience Social Support  Sleep:  Number of Hours: 5.75  Cognition: WNL  ADL's:  Intact   Mental Status Per Nursing Assessment::   On Admission:     Demographic Factors:  66 year old married female  Loss Factors: Adult son died 3 years ago/ completed suicide. Husband has medical  illnesses.  Historical Factors: No prior psychiatric admissions. Reports history of feeling agitated or activated with several antidepressant trials in the past .   Risk Reduction Factors:   Sense of responsibility to family, Living with another person, especially a relative, Positive social support and Positive coping skills or problem solving skills  Continued Clinical Symptoms:  At this time patient is alert, attentive, well related, well groomed. Reports she feels better than on admission. Currently endorses mood as improved , affect presents less anxious, reactive, no thought disorder, remains somatically concerned , regarding having dry sinuses and nostrils. Denies suicidal or self injurious ideations, denies homicidal or violent ideations, no hallucinations, no delusions , future oriented . Of note, today less concerned about possible side effects- tolerating Abilify well, and expresses optimism it will help her. She is wanting to continue at 5 mgrs QDAY .  Behavior on unit in good control, pleasant on approach  Cognitive Features That Contribute To Risk:  No gross cognitive deficits noted upon discharge. Is alert , attentive, and oriented x 3   Suicide Risk:  Mild:  Suicidal ideation of limited frequency, intensity, duration, and specificity.  There are no identifiable plans, no associated intent, mild dysphoria and related symptoms, good self-control (both objective and subjective assessment), few other risk factors, and identifiable protective factors, including available and accessible social support.  [facility] Follow up.   Why:  Social worker has left a message with the receptionist but was unable to get a call back to schedule your appointment. Please call the  office upon discharge to be scheduled for a therapy appointment with Mikki Santee. Contact information:  [address] [place], [place] [address] P: [phone] F: [place], [facility] Follow up on 10/12/2017.   Specialty:  Behavioral Health Why:  Medication management appointment 11/29 at 9:45am with Dr. Judithann Graves. Please call the office if you need to cancel or reschedule your appointment. Contact information: [place] [address] [phone]           Plan Of Care/Follow-up recommendations:  Activity:  as tolerated  Diet:  regular Tests:  NA Other:  see below  Patient is requesting discharge and there are no current grounds for involuntary commitment  She is leaving in good spirits  States she has appointment with her therapist, Mr. Bernell List, early next week.  She also plans to seek grief counseling . Patient to follow up with her PCP for medical issues as needed .  Jenne Campus, MD 09/30/2017, 12:46 PM

## 2017-09-30 NOTE — BHH Group Notes (Signed)
LCSW Group Therapy Note  Date/Time:  09/30/2017   10:00AM-11:00AM  Type of Therapy and Topic:  Group Therapy:  Fears and Unhealthy/Healthy Coping Skills  Participation Level:  Active   Description of Group:  The focus of this group was to discuss some of the prevalent fears that patients experience, and to identify the commonalities among group members.  An exercise was used to initiate the discussion, followed by writing on the white board a group-generated list of unhealthy coping and healthy coping techniques to deal with each fear.    Therapeutic Goals: 1. Patient will be able to distinguish between healthy and unhealthy coping skills 2. Patient will identify and describe 3 fears they experience 3. Patient will identify one positive coping strategy for each fear they experience 4. Patient will respond empathetically to peers' statements regarding fears they experience  Summary of Patient Progress:  The patient expressed a fear of being alone, stating that she does not mean she is scared to not be in a relationship, but is actually referring to the act of being by herself.  She was open and attentive to other group members' feedback.  Therapeutic Modalities Cognitive Behavioral Therapy Motivational Interviewing  Selmer Dominion, LCSW

## 2017-09-30 NOTE — Progress Notes (Signed)
D. Pt interacting with peers in dayroom upon initial approach. Pt attended group and completed pt self inventory. Pt rates her depression and anxiety, both a 3/10, and hopelessness a '0'.  Pt reports that she is looking forward to discharge. Pt states that her goal today is to work on her coping skills and to 'think positive'. . Pt currently denies SI/HI and AVH and agrees to contact staff before acting on any harmful thoughts.  A. Labs and vitals monitored. Pt compliant with medications. Pt supported emotionally and encouraged to express concerns and ask questions.   R. Pt remains safe with 15 minute checks. Will continue POC.

## 2017-09-30 NOTE — BHH Suicide Risk Assessment (Signed)
[facility] INPATIENT:  Family/Significant Other Suicide Prevention Education  Suicide Prevention Education:  Contact Attempts: Edythe Lynn, daughter, [phone] has been identified by the patient as the family member/significant other with whom the patient will be residing, and identified as the person(s) who will aid the patient in the event of a mental health crisis.  With written consent from the patient, two attempts were made to provide suicide prevention education, prior to and/or following the patient's discharge.  We were unsuccessful in providing suicide prevention education.  A suicide education pamphlet was given to the patient to share with family/significant other.  Date and time of first attempt:  09/30/2017 /9:30am Date and time of second attempt:09/30/2017./10:00 AM   Maretta Los 09/30/2017, 9:59 AM

## 2017-09-30 NOTE — Progress Notes (Signed)
Pt discharged to lobby- husband waiting.Pt was stable and appreciative at that time. All papers and prescriptions were given and valuables returned. Verbal understanding expressed. Denies SI/HI and A/VH. Pt given opportunity to express concerns and ask questions.

## 2017-09-30 NOTE — BHH Group Notes (Signed)
[place] Group Notes:  (Nursing)  Date:  09/30/2017  Time:  10:33 AM  Type of Therapy:  Nurse Education  Participation Level:  Active  Participation Quality:  Appropriate  Affect:  Appropriate  Cognitive:  Alert, Appropriate and Oriented  Insight:  Appropriate  Engagement in Group:  Engaged  Modes of Intervention:  Discussion  Summary of Progress/Problems: Discussed Goal setting/ Healthy Coping Skills Workbook  Waymond Cera 09/30/2017, 10:33 AM

## 2017-09-30 NOTE — Discharge Summary (Signed)
Physician Discharge Summary Note  Patient:  Destiny Elliott is an 66 y.o., female MRN:  [id number] DOB:  1951/09/22 Patient phone:  [phone] (home)  Patient address:   [address] [place] [address],  Total Time spent with patient: 30 minutes  Date of Admission:  09/26/2017 Date of Discharge: 09/30/2017 Reason for Admission: PER HPI notes on - 09/25/17 [facility] Counselor Assessment: 66 y.o.femalethat presents this date under IVC.Per IVC: "Respondent has stopped taking her medications three days ago. The medicationshe is onis on is Atenezol (50mg ) . Respondent states she can no longer live like this anymore and went to purchase bullets at [facility]. The respondent had a empty gun squeezing the trigger over and over and left her residence this date to purchase ammunition. Her son committed suicide 4 years ago and his death really affected the respondent. She is a danger to herself." Patient is very tearful and liable during the assessment. Patient states she is currently receiving services from [facility] but has only seen him once. Patient has beenprescribedXanax PRN for anxiety but states "she rarely takes it." Patient is very anxious and is pressured with her speech. Patient stated her son committed suicide 4 years ago and she "never got over it." Patient reports that she has been suffering from depression and anxiety since then reporting ongoing symptoms to include: guilt, hopelessness and isolating. Patient stated she has seen by multiple providers and tried various medications reporting "all of them did not work or had bad side effects." Patient states her marriage of 69 years has been strained by her ongoing Shannon issues and her and her husband "just don't get along at times." Patient denies any prior attempts/gestures at self harm or inpatient admissions to address any MH issues. Patient is time/place oriented and denies any H/I or AVH. Patient doesadmitto ongoing thoughts of self  harm this date and was planning to purchase ammunition to take her life this date. Patient states she "his the gun last night" but did inform her husband this date who has secured that firearm. Per notes, patient's husband reports that the patient was threatening to shoot herself and [facility] [employer] [facility]. [employer] brought IVC papers in. Patient's son committed suicide several years ago by shooting himself at their home. Patient reportsshe has a history of depression and anxiety. 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 She states that she has reactions to antidepressants and cannot take them. She states"they make her head feel funnyandoccasionally give her a dry mouth". Most recently prescribed Zoloft about 3 or 4 weeks ago and had a dry mouth and visual reaction to this.She admits to ongoing thoughts of selfharm but states " I am not sure I could go through with it." Per note review patient was seen at [facility] on 05/26/17 as a walk for similar symptoms but did not meet inpatient criteria as was instructed to follow up with OP provider.    Principal Problem: Bipolar disorder, unspecified [facility]) Discharge Diagnoses: Patient Active Problem List   Diagnosis Date Noted  . Bipolar disorder, unspecified ([place]) [F31.9] 09/26/2017    Past Psychiatric History:   Past Medical History:  Past Medical History:  Diagnosis Date  . Abnormal Pap smear 1994   --CIN I  . Allergic rhinitis   . Anxiety   . Dense breasts   . Endocervical polyp 01/1993   -benign  . Hypercholesterolemia   . Hypertension   . Osteopenia     Past Surgical  History:  Procedure Laterality Date  . CERVICAL BIOPSY  W/ LOOP ELECTRODE EXCISION  03/1993   CIN I  . COLONOSCOPY  2016  . endocervical polyp  01-27-93   -benign  . TOOTH EXTRACTION  08/2017   Family History:  Family History  Problem Relation Age of Onset  . Stroke Father 92  . Kidney disease Father   . Hypertension Father   . Hypertension  Sister 104  . Hyperlipidemia Sister   . COPD Sister   . Breast cancer Sister 72  . Thyroid disease Mother 2  . Heart failure Mother   . Heart disease Mother   . Hypertension Mother   . COPD Mother   . Heart disease Brother 47       AFIB  . Suicidality Son 39       07/20/2013  . Thyroid disease Sister 52   Family Psychiatric  History:  Social History:  Social History   Substance and Sexual Activity  Alcohol Use Yes   Comment: occ wine     Social History   Substance and Sexual Activity  Drug Use No    Social History   Socioeconomic History  . Marital status: Married    Spouse name: Sherren Mocha  . Number of children: 2  . Years of education: 16  . Highest education level: None  Social Needs  . Financial resource strain: None  . Food insecurity - worry: None  . Food insecurity - inability: None  . Transportation needs - medical: None  . Transportation needs - non-medical: None  Occupational History  . 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 Smoking status: Former Smoker    Packs/day: 0.50    Years: 28.00    Pack years: 14.00    Types: Cigarettes    Last attempt to quit: 07/01/2004    Years since quitting: 13.2  . Smokeless tobacco: Never Used  Substance and Sexual Activity  . Alcohol use: Yes    Comment: occ wine  . Drug use: No  . Sexual activity: Yes    Partners: Male    Birth control/protection: Post-menopausal  Other Topics Concern  . None  Social History Narrative   Lives with Sherren Mocha, one child deceased   Caffeine- 2 cups of tea    Hospital Course:  LATAVIA GOGA was admitted for Bipolar disorder, unspecified ([place]) and crisis management.  Pt was treated discharged with the medications listed below under Medication List.  Medical problems were identified and treated as needed.  Home medications were restarted as appropriate.  Improvement was monitored by observation and Sharyn Creamer 's daily report of symptom reduction.   Emotional and mental status was monitored by daily self-inventory reports completed by Sharyn Creamer and clinical staff.         APOLONIA ELLWOOD was evaluated by the treatment team for stability and plans for continued recovery upon discharge. NARIYA NEUMEYER 's motivation was an integral factor for scheduling further treatment. Employment, transportation, bed availability, health status, family support, and any pending legal issues were also considered during hospital stay. Pt was offered further treatment options upon discharge including but not limited to Residential, Intensive Outpatient, and Outpatient treatment.  REXANN LUERAS will follow up with the services as listed below under Follow Up Information.  Reports she also has additional resources that was provided by Liliane Channel to f/u with [facility] outpatient services. [place] states she will keep f/u appt on  Monday 10/02/2017 with "Mikki Santee" at [place] counseling  center.   Upon completion of this admission the patient was both mentally and medically stable for discharge denying suicidal/homicidal ideation, auditory/visual/tactile hallucinations, delusional thoughts and paranoia.    Sharyn Creamer responded well to treatment with taper xanax to Ativan 0.5mg , Abilify 5 mg and trazodone 50 mg without adverse effects. Pt demonstrated improvement without reported or observed adverse effects to the point of stability appropriate for outpatient management. Pertinent labs include: CMP, CBC  for which outpatient follow-up is necessary for lab recheck as mentioned below. Reviewed CBC, CMP, BAL, and UDS; all unremarkable aside from noted exceptions.   Physical Findings: AIMS: Facial and Oral Movements Muscles of Facial Expression: None, normal Lips and Perioral Area: None, normal Jaw: None, normal Tongue: None, normal,Extremity Movements Upper (arms, wrists, hands, fingers): None, normal Lower (legs, knees, ankles, toes): None, normal, Trunk Movements Neck,  shoulders, hips: None, normal, Overall Severity Severity of abnormal movements (highest score from questions above): None, normal Incapacitation due to abnormal movements: None, normal Patient's awareness of abnormal movements (rate only patient's report): No Awareness, Dental Status Current problems with teeth and/or dentures?: No Does patient usually wear dentures?: Yes  CIWA:    COWS:     Musculoskeletal: Strength & Muscle Tone: within normal limits Gait & Station: normal Patient leans: N/A  Psychiatric Specialty Exam: See SRA by MD Physical Exam  Nursing note and vitals reviewed. Constitutional: She appears well-developed.  Cardiovascular: Normal rate.  Skin: Skin is warm.    Review of Systems  Psychiatric/Behavioral: Negative for depression and suicidal ideas. The patient is nervous/anxious. The patient does not have insomnia.     Blood pressure 121/76, pulse 64, temperature 99.3 F (37.4 C), temperature source Oral, resp. rate 16, height 5\' 2"  (1.575 m), weight 59.9 kg (132 lb), last menstrual period 10/14/2002.Body mass index is 24.14 kg/m.    Have you used any form of tobacco in the last 30 days? (Cigarettes, Smokeless Tobacco, Cigars, and/or Pipes): No  Has this patient used any form of tobacco in the last 30 days? (Cigarettes, Smokeless Tobacco, Cigars, and/or Pipes), No  Blood Alcohol level:  Lab Results  Component Value Date   ETH <10 09/25/2017   ETH <5 69/62/9528    Metabolic Disorder Labs:  No results found for: HGBA1C, MPG No results found for: PROLACTIN No results found for: CHOL, TRIG, HDL, CHOLHDL, VLDL, LDLCALC  See Psychiatric Specialty Exam and Suicide Risk Assessment completed by Attending Physician prior to discharge.  Discharge destination:  Home  Is patient on multiple antipsychotic therapies at discharge:  No   Has Patient had three or more failed trials of antipsychotic monotherapy by history:  No  Recommended Plan for Multiple  Antipsychotic Therapies: NA  Discharge Instructions    Diet - low sodium heart healthy   Complete by:  As directed    Discharge instructions   Complete by:  As directed    Take all medications as prescribed. Keep all follow-up appointments as scheduled.  Do not consume alcohol or use illegal drugs while on prescription medications. Report any adverse effects from your medications to your primary care provider promptly.  In the event of recurrent symptoms or worsening symptoms, call 911, a crisis hotline, or go to the nearest emergency department for evaluation.   Increase activity slowly   Complete by:  As directed      Allergies as of 09/30/2017      Reactions   Benadryl [diphenhydramine Hcl]  Causes nasal dryness   Nickel Itching   Tylenol Pm Extra [diphenhydramine-apap (sleep)] Other (See Comments)   Causes nasal dryness   Vitamin D Analogs    doesn't take Vit D supplements because they make her feel bad       Medication List    STOP taking these medications   ALPRAZolam 0.25 MG tablet Commonly known as:  XANAX   AYR SALINE NASAL GEL Swab   RHINASE Gel   sodium chloride 0.65 % Soln nasal spray Commonly known as:  OCEAN     TAKE these medications     Indication  ARIPiprazole 5 MG tablet Commonly known as:  ABILIFY Take 1 tablet (5 mg total) daily by mouth. Start taking on:  10/01/2017  Indication:  Major Depressive Disorder   atenolol 50 MG tablet Commonly known as:  TENORMIN Take 50 mg by mouth daily.  Indication:  High Blood Pressure Disorder   LORazepam 0.5 MG tablet Commonly known as:  ATIVAN Take 1 tablet (0.5 mg total) every 6 (six) hours as needed by mouth for anxiety.  Indication:  Agitation   traZODone 50 MG tablet Commonly known as:  DESYREL Take 1 tablet (50 mg total) at bedtime and may repeat dose one time if needed by mouth.  Indication:  Anxiety Disorder      Follow-up [place] Follow up.    Why:  Social worker has left a message with the receptionist but was unable to get a call back to schedule your appointment. Please call the office upon discharge to be scheduled for a therapy appointment with Mikki Santee. Contact information:  [address] [place], [place] [address] P: [phone] F: [place] Follow up on 10/12/2017.   Specialty:  Behavioral Health Why:  Medication management appointment 11/29 at 9:45am with Dr. Judithann Graves. Please call the office if you need to cancel or reschedule your appointment. Contact information: [place] [address] [phone]           Follow-up recommendations:  Activity:  as tolerated Diet:  heart healthy  Comments: Take all medications as prescribed. Keep all follow-up appointments as scheduled.  Do not consume alcohol or use illegal drugs while on prescription medications. Report any adverse effects from your medications to your primary care provider promptly.  In the event of recurrent symptoms or worsening symptoms, call 911, a crisis hotline, or go to the nearest emergency department for evaluation.   Signed: Derrill Center, NP 09/30/2017, 9:20 AM   Patient seen, Suicide Assessment Completed.  Disposition Plan Reviewed

## 2017-10-02 DIAGNOSIS — F411 Generalized anxiety disorder: Secondary | ICD-10-CM | POA: Diagnosis not present

## 2017-10-02 DIAGNOSIS — R69 Illness, unspecified: Secondary | ICD-10-CM | POA: Diagnosis not present

## 2017-10-09 DIAGNOSIS — R69 Illness, unspecified: Secondary | ICD-10-CM | POA: Diagnosis not present

## 2017-10-09 DIAGNOSIS — X58XXXA Exposure to other specified factors, initial encounter: Secondary | ICD-10-CM | POA: Diagnosis not present

## 2017-10-09 DIAGNOSIS — J3489 Other specified disorders of nose and nasal sinuses: Secondary | ICD-10-CM | POA: Diagnosis not present

## 2017-10-09 DIAGNOSIS — T50905A Adverse effect of unspecified drugs, medicaments and biological substances, initial encounter: Secondary | ICD-10-CM | POA: Diagnosis not present

## 2017-10-10 ENCOUNTER — Ambulatory Visit: Payer: Medicare HMO | Admitting: Neurology

## 2017-10-10 DIAGNOSIS — F411 Generalized anxiety disorder: Secondary | ICD-10-CM | POA: Diagnosis not present

## 2017-10-10 DIAGNOSIS — R69 Illness, unspecified: Secondary | ICD-10-CM | POA: Diagnosis not present

## 2017-10-12 DIAGNOSIS — J329 Chronic sinusitis, unspecified: Secondary | ICD-10-CM | POA: Diagnosis not present

## 2017-10-16 DIAGNOSIS — F411 Generalized anxiety disorder: Secondary | ICD-10-CM | POA: Diagnosis not present

## 2017-10-16 DIAGNOSIS — R69 Illness, unspecified: Secondary | ICD-10-CM | POA: Diagnosis not present

## 2017-10-25 ENCOUNTER — Ambulatory Visit: Payer: Medicare HMO | Admitting: Neurology

## 2017-11-15 ENCOUNTER — Other Ambulatory Visit: Payer: Self-pay | Admitting: Family Medicine

## 2017-11-15 DIAGNOSIS — Z1231 Encounter for screening mammogram for malignant neoplasm of breast: Secondary | ICD-10-CM

## 2017-11-22 ENCOUNTER — Encounter (HOSPITAL_COMMUNITY): Payer: Self-pay | Admitting: Nurse Practitioner

## 2017-11-22 ENCOUNTER — Emergency Department (HOSPITAL_COMMUNITY): Payer: Medicare HMO

## 2017-11-22 DIAGNOSIS — R05 Cough: Secondary | ICD-10-CM | POA: Insufficient documentation

## 2017-11-22 DIAGNOSIS — Z87891 Personal history of nicotine dependence: Secondary | ICD-10-CM | POA: Diagnosis not present

## 2017-11-22 DIAGNOSIS — R197 Diarrhea, unspecified: Secondary | ICD-10-CM | POA: Insufficient documentation

## 2017-11-22 DIAGNOSIS — R000 Tachycardia, unspecified: Secondary | ICD-10-CM | POA: Diagnosis not present

## 2017-11-22 DIAGNOSIS — R69 Illness, unspecified: Secondary | ICD-10-CM | POA: Diagnosis not present

## 2017-11-22 DIAGNOSIS — Z79899 Other long term (current) drug therapy: Secondary | ICD-10-CM | POA: Diagnosis not present

## 2017-11-22 DIAGNOSIS — A09 Infectious gastroenteritis and colitis, unspecified: Secondary | ICD-10-CM | POA: Diagnosis not present

## 2017-11-22 LAB — COMPREHENSIVE METABOLIC PANEL
ALT: 38 U/L (ref 14–54)
AST: 55 U/L — ABNORMAL HIGH (ref 15–41)
Albumin: 3.2 g/dL — ABNORMAL LOW (ref 3.5–5.0)
Alkaline Phosphatase: 87 U/L (ref 38–126)
Anion gap: 12 (ref 5–15)
BUN: <5 mg/dL — ABNORMAL LOW (ref 6–20)
CO2: 25 mmol/L (ref 22–32)
Calcium: 8.7 mg/dL — ABNORMAL LOW (ref 8.9–10.3)
Chloride: 97 mmol/L — ABNORMAL LOW (ref 101–111)
Creatinine, Ser: 0.63 mg/dL (ref 0.44–1.00)
GFR calc Af Amer: >60 mL/min (ref >60)
GFR calc non Af Amer: >60 mL/min (ref >60)
Glucose, Bld: 138 mg/dL — ABNORMAL HIGH (ref 65–99)
Potassium: 3.2 mmol/L — ABNORMAL LOW (ref 3.5–5.1)
Sodium: 134 mmol/L — ABNORMAL LOW (ref 135–145)
Total Bilirubin: 0.9 mg/dL (ref 0.3–1.2)
Total Protein: 7.1 g/dL (ref 6.5–8.1)

## 2017-11-22 LAB — LIPASE, BLOOD: Lipase: 33 U/L (ref 11–51)

## 2017-11-22 LAB — CBC
HCT: 34.5 % — ABNORMAL LOW (ref 36.0–46.0)
Hemoglobin: 11.3 g/dL — ABNORMAL LOW (ref 12.0–15.0)
MCH: 29.2 pg (ref 26.0–34.0)
MCHC: 32.8 g/dL (ref 30.0–36.0)
MCV: 89.1 fL (ref 78.0–100.0)
Platelets: 381 10*3/uL (ref 150–400)
RBC: 3.87 MIL/uL (ref 3.87–5.11)
RDW: 12.4 % (ref 11.5–15.5)
WBC: 14.8 10*3/uL — ABNORMAL HIGH (ref 4.0–10.5)

## 2017-11-22 MED ORDER — IBUPROFEN 400 MG PO TABS
600.0000 mg | ORAL_TABLET | Freq: Once | ORAL | Status: AC
  Administered 2017-11-22: 600 mg via ORAL
  Filled 2017-11-22: qty 1

## 2017-11-22 MED ORDER — ONDANSETRON 4 MG PO TBDP
4.0000 mg | ORAL_TABLET | Freq: Once | ORAL | Status: AC | PRN
  Administered 2017-11-22: 4 mg via ORAL
  Filled 2017-11-22: qty 1

## 2017-11-22 NOTE — ED Triage Notes (Signed)
Pt notes her and her husband have been ill for the last week with cough, ear fullness, nausea, diarrhea and generalized weakness x 7 days. Pt sts thought it was viral syndrome so she adhered to brat diet and drank lots of fluid but still notes no improvement of symptoms.

## 2017-11-23 ENCOUNTER — Emergency Department (HOSPITAL_COMMUNITY)
Admission: EM | Admit: 2017-11-23 | Discharge: 2017-11-23 | Disposition: A | Discharge status: Home / Self Care | Payer: Medicare HMO | Attending: Emergency Medicine | Admitting: Emergency Medicine

## 2017-11-23 ENCOUNTER — Encounter (HOSPITAL_COMMUNITY): Payer: Self-pay | Admitting: Emergency Medicine

## 2017-11-23 DIAGNOSIS — Z79899 Other long term (current) drug therapy: Secondary | ICD-10-CM | POA: Diagnosis not present

## 2017-11-23 DIAGNOSIS — A09 Infectious gastroenteritis and colitis, unspecified: Secondary | ICD-10-CM

## 2017-11-23 DIAGNOSIS — Z87891 Personal history of nicotine dependence: Secondary | ICD-10-CM | POA: Diagnosis not present

## 2017-11-23 DIAGNOSIS — R059 Cough, unspecified: Secondary | ICD-10-CM

## 2017-11-23 DIAGNOSIS — R05 Cough: Secondary | ICD-10-CM

## 2017-11-23 DIAGNOSIS — R197 Diarrhea, unspecified: Secondary | ICD-10-CM | POA: Diagnosis not present

## 2017-11-23 LAB — URINALYSIS, ROUTINE W REFLEX MICROSCOPIC
Bacteria, UA: NONE SEEN
Bilirubin Urine: NEGATIVE
Glucose, UA: NEGATIVE mg/dL
Ketones, ur: NEGATIVE mg/dL
Leukocytes, UA: NEGATIVE
Nitrite: NEGATIVE
Protein, ur: NEGATIVE mg/dL
Specific Gravity, Urine: 1.001 — ABNORMAL LOW (ref 1.005–1.030)
Squamous Epithelial / LPF: NONE SEEN
pH: 6 (ref 5.0–8.0)

## 2017-11-23 MED ORDER — DICYCLOMINE HCL 10 MG PO CAPS
10.0000 mg | ORAL_CAPSULE | Freq: Once | ORAL | Status: AC
  Administered 2017-11-23: 10 mg via ORAL
  Filled 2017-11-23: qty 1

## 2017-11-23 MED ORDER — ONDANSETRON 4 MG PO TBDP
8.0000 mg | ORAL_TABLET | Freq: Once | ORAL | Status: AC
Start: 2017-11-23 — End: 2017-11-23
  Administered 2017-11-23: 8 mg via ORAL
  Filled 2017-11-23: qty 2

## 2017-11-23 MED ORDER — AMOXICILLIN-POT CLAVULANATE 875-125 MG PO TABS: 1.0000 | ORAL_TABLET | Freq: Two times a day (BID) | ORAL | 0 refills | Status: DC

## 2017-11-23 MED ORDER — AMOXICILLIN-POT CLAVULANATE 875-125 MG PO TABS
1.0000 | ORAL_TABLET | Freq: Once | ORAL | Status: AC
  Administered 2017-11-23: 1 via ORAL
  Filled 2017-11-23: qty 1

## 2017-11-23 NOTE — ED Provider Notes (Signed)
[facility] EMERGENCY DEPARTMENT Provider Note   CSN: [id number] Arrival date & time: 11/22/17  1922     History   Chief Complaint Chief Complaint  Patient presents with  . Diarrhea    HPI Destiny Elliott is a 67 y.o. female.  The history is provided by the patient.  Diarrhea   This is a new problem. The current episode started 2 days ago. The problem occurs 2 to 4 times per day. The problem has not changed since onset.The stool consistency is described as watery. There has been no fever. Associated symptoms include myalgias, URI and cough. Pertinent negatives include no abdominal pain, no vomiting, no chills, no sweats, no headaches and no arthralgias. She has tried nothing for the symptoms. The treatment provided no relief. Risk factors include ill contacts. Her past medical history does not include bowel resection.    Past Medical History:  Diagnosis Date  . Abnormal Pap smear 1994   --CIN I  . Allergic rhinitis   . Anxiety   . Dense breasts   . Endocervical polyp 01/1993   -benign  . Hypercholesterolemia   . Hypertension   . Osteopenia     Patient Active Problem List   Diagnosis Date Noted  . Bipolar disorder, unspecified ([place]) 09/26/2017    Past Surgical History:  Procedure Laterality Date  . CERVICAL BIOPSY  W/ LOOP ELECTRODE EXCISION  03/1993   CIN I  . COLONOSCOPY  2016  . endocervical polyp  01-27-93   -benign  . TOOTH EXTRACTION  08/2017    OB History    Gravida Para Term Preterm AB Living   2 2 2  0 0 2   SAB TAB Ectopic Multiple Live Births   0 0 0 0 2       Home Medications    Prior to Admission medications   Medication Sig Start Date End Date Taking? Authorizing Provider  ARIPiprazole (ABILIFY) 5 MG tablet Take 1 tablet (5 mg total) daily by mouth. 10/01/17   Derrill Center, NP  atenolol (TENORMIN) 50 MG tablet Take 50 mg by mouth daily.     [provider]  LORazepam (ATIVAN) 0.5 MG tablet Take 1 tablet (0.5 mg  total) every 6 (six) hours as needed by mouth for anxiety. 09/30/17   Derrill Center, NP  traZODone (DESYREL) 50 MG tablet Take 1 tablet (50 mg total) at bedtime and may repeat dose one time if needed by mouth. 09/30/17   Derrill Center, NP    Family History Family History  Problem Relation Age of Onset  . Stroke Father 29  . Kidney disease Father   . Hypertension Father   . Hypertension Sister 33  . Hyperlipidemia Sister   . COPD Sister   . Breast cancer Sister 64  . Thyroid disease Mother 7  . Heart failure Mother   . Heart disease Mother   . Hypertension Mother   . COPD Mother   . Heart disease Brother 4       AFIB  . Suicidality Son 39       07/20/2013  . Thyroid disease Sister 34    Social History Social History   Tobacco Use  . Smoking status: Former Smoker    Packs/day: 0.50    Years: 28.00    Pack years: 14.00    Types: Cigarettes    Last attempt to quit: 07/01/2004    Years since quitting: 13.4  . Smokeless tobacco: Never Used  Substance Use Topics  . Alcohol use: Yes    Comment: occ wine  . Drug use: No     Allergies   Benadryl [diphenhydramine hcl]; Nickel; Tylenol pm extra [diphenhydramine-apap (sleep)]; and Vitamin d analogs   Review of Systems Review of Systems  Constitutional: Negative for chills.  HENT: Positive for congestion. Negative for tinnitus, trouble swallowing and voice change.   Respiratory: Positive for cough. Negative for shortness of breath.   Cardiovascular: Negative for chest pain.  Gastrointestinal: Positive for diarrhea. Negative for abdominal pain, constipation, nausea and vomiting.  Musculoskeletal: Positive for myalgias. Negative for arthralgias, back pain, neck pain and neck stiffness.  Neurological: Negative for headaches.  All other systems reviewed and are negative.    Physical Exam Updated Vital Signs BP 102/60 (BP Location: Right Arm)   Pulse 73   Temp 99.8 F (37.7 C)   Resp 15   Ht 5\' 2"  (1.575 m)   Wt  58.1 kg (128 lb)   LMP 10/14/2002 (Approximate)   SpO2 94%   BMI 23.41 kg/m   Physical Exam  Constitutional: She is oriented to person, place, and time. She appears well-developed and well-nourished. No distress.  HENT:  Head: Normocephalic and atraumatic.  Mouth/Throat: Oropharynx is clear and moist. No oropharyngeal exudate.  Eyes: Conjunctivae are normal. Pupils are equal, round, and reactive to light.  Neck: Normal range of motion. Neck supple. No JVD present.  Cardiovascular: Normal rate, regular rhythm, normal heart sounds and intact distal pulses.  Pulmonary/Chest: Effort normal and breath sounds normal. No stridor. She has no wheezes. She has no rales.  Abdominal: Soft. Bowel sounds are normal. She exhibits no mass. There is no tenderness. There is no rebound and no guarding.  Musculoskeletal: Normal range of motion.  Lymphadenopathy:    She has no cervical adenopathy.  Neurological: She is alert and oriented to person, place, and time. She displays normal reflexes.  Skin: Skin is warm and dry. Capillary refill takes less than 2 seconds.  Psychiatric: She has a normal mood and affect.     ED Treatments / Results  Labs (all labs ordered are listed, but only abnormal results are displayed)  Results for orders placed or performed during the hospital encounter of 11/23/17  Lipase, blood  Result Value Ref Range   Lipase 33 11 - 51 U/L  Comprehensive metabolic panel  Result Value Ref Range   Sodium 134 (L) 135 - 145 mmol/L   Potassium 3.2 (L) 3.5 - 5.1 mmol/L   Chloride 97 (L) 101 - 111 mmol/L   CO2 25 22 - 32 mmol/L   Glucose, Bld 138 (H) 65 - 99 mg/dL   BUN <5 (L) 6 - 20 mg/dL   Creatinine, Ser 0.63 0.44 - 1.00 mg/dL   Calcium 8.7 (L) 8.9 - 10.3 mg/dL   Total Protein 7.1 6.5 - 8.1 g/dL   Albumin 3.2 (L) 3.5 - 5.0 g/dL   AST 55 (H) 15 - 41 U/L   ALT 38 14 - 54 U/L   Alkaline Phosphatase 87 38 - 126 U/L   Total Bilirubin 0.9 0.3 - 1.2 mg/dL   GFR calc non Af Amer  >60 >60 mL/min   GFR calc Af Amer >60 >60 mL/min   Anion gap 12 5 - 15  CBC  Result Value Ref Range   WBC 14.8 (H) 4.0 - 10.5 K/uL   RBC 3.87 3.87 - 5.11 MIL/uL   Hemoglobin 11.3 (L) 12.0 - 15.0 g/dL   HCT  34.5 (L) 36.0 - 46.0 %   MCV 89.1 78.0 - 100.0 fL   MCH 29.2 26.0 - 34.0 pg   MCHC 32.8 30.0 - 36.0 g/dL   RDW 12.4 11.5 - 15.5 %   Platelets 381 150 - 400 K/uL  Urinalysis, Routine w reflex microscopic  Result Value Ref Range   Color, Urine STRAW (A) YELLOW   APPearance CLEAR CLEAR   Specific Gravity, Urine 1.001 (L) 1.005 - 1.030   pH 6.0 5.0 - 8.0   Glucose, UA NEGATIVE NEGATIVE mg/dL   Hgb urine dipstick SMALL (A) NEGATIVE   Bilirubin Urine NEGATIVE NEGATIVE   Ketones, ur NEGATIVE NEGATIVE mg/dL   Protein, ur NEGATIVE NEGATIVE mg/dL   Nitrite NEGATIVE NEGATIVE   Leukocytes, UA NEGATIVE NEGATIVE   RBC / HPF 0-5 0 - 5 RBC/hpf   WBC, UA 0-5 0 - 5 WBC/hpf   Bacteria, UA NONE SEEN NONE SEEN   Squamous Epithelial / LPF NONE SEEN NONE SEEN   Dg Chest 2 View  Result Date: 11/22/2017 CLINICAL DATA:  Fever, nausea productive cough. EXAM: CHEST  2 VIEW COMPARISON:  None. FINDINGS: Cardiomediastinal silhouette is normal. Mediastinal contours appear intact. Calcific atherosclerotic disease of the aorta. There is no evidence of pneumothorax. Left lower lobe, lingula, and right middle lobe patchy airspace consolidation. Probable small left pleural effusion. Stigmata of COPD. Osseous structures are without acute abnormality. Soft tissues are grossly normal. IMPRESSION: Bilateral patchy airspace consolidation, which given patient's clinical presentation likely represents multilobar pneumonia. COPD. Electronically Signed   By: Fidela Salisbury M.D.   On: 11/22/2017 21:50    EKG  EKG Interpretation  Date/Time:  Wednesday November 22 2017 21:00:43 EST Ventricular Rate:  101 PR Interval:  118 QRS Duration: 84 QT Interval:  364 QTC Calculation: 471 R Axis:   78 Text Interpretation:   Sinus tachycardia with Premature atrial complexes with Abberant conduction Nonspecific ST abnormality Abnormal ECG When compared with ECG of 09/04/2017, Premature atrial complexes are now present Confirmed by Delora Fuel ([id number]) on 11/22/2017 11:11:06 PM       Radiology Dg Chest 2 View  Result Date: 11/22/2017 CLINICAL DATA:  Fever, nausea productive cough. EXAM: CHEST  2 VIEW COMPARISON:  None. FINDINGS: Cardiomediastinal silhouette is normal. Mediastinal contours appear intact. Calcific atherosclerotic disease of the aorta. There is no evidence of pneumothorax. Left lower lobe, lingula, and right middle lobe patchy airspace consolidation. Probable small left pleural effusion. Stigmata of COPD. Osseous structures are without acute abnormality. Soft tissues are grossly normal. IMPRESSION: Bilateral patchy airspace consolidation, which given patient's clinical presentation likely represents multilobar pneumonia. COPD. Electronically Signed   By: Fidela Salisbury M.D.   On: 11/22/2017 21:50    Procedures Procedures (including critical care time)  Medications Ordered in ED Medications  amoxicillin-clavulanate (AUGMENTIN) 875-125 MG per tablet 1 tablet (not administered)  dicyclomine (BENTYL) capsule 10 mg (not administered)  ondansetron (ZOFRAN-ODT) disintegrating tablet 8 mg (not administered)  ondansetron (ZOFRAN-ODT) disintegrating tablet 4 mg (4 mg Oral Given 11/22/17 0000)  ibuprofen (ADVIL,MOTRIN) tablet 600 mg (600 mg Oral Given 11/22/17 2114)     Final Clinical Impressions(s) / ED Diagnoses   Return for worsening pain, fevers > 100.4 unrelieved by medication, shortness of breath, intractable vomiting, or diarrhea, abdominal pain, Inability to tolerate liquids or food, cough, altered mental status or any concerns. No signs of systemic illness or infection. The patient is nontoxic-appearing on exam and vital signs are within normal limits.    I have reviewed  the triage vital signs and  the nursing notes. Pertinent labs &imaging results that were available during my care of the patient were reviewed by me and considered in my medical decision making (see chart for details).  After history, exam, and medical workup I feel the patient has been appropriately medically screened and is safe for discharge home. Pertinent diagnoses were discussed with the patient. Patient was given return precautions.       Yania Bogie, MD 11/23/17 [phone]

## 2017-11-29 DIAGNOSIS — H6121 Impacted cerumen, right ear: Secondary | ICD-10-CM | POA: Diagnosis not present

## 2017-11-29 DIAGNOSIS — H900 Conductive hearing loss, bilateral: Secondary | ICD-10-CM | POA: Diagnosis not present

## 2017-11-29 DIAGNOSIS — H6523 Chronic serous otitis media, bilateral: Secondary | ICD-10-CM | POA: Diagnosis not present

## 2017-11-29 DIAGNOSIS — H6983 Other specified disorders of Eustachian tube, bilateral: Secondary | ICD-10-CM | POA: Diagnosis not present

## 2017-11-30 DIAGNOSIS — R69 Illness, unspecified: Secondary | ICD-10-CM | POA: Diagnosis not present

## 2017-12-06 DIAGNOSIS — J3489 Other specified disorders of nose and nasal sinuses: Secondary | ICD-10-CM | POA: Diagnosis not present

## 2017-12-06 DIAGNOSIS — H6502 Acute serous otitis media, left ear: Secondary | ICD-10-CM | POA: Diagnosis not present

## 2017-12-06 DIAGNOSIS — Z87891 Personal history of nicotine dependence: Secondary | ICD-10-CM | POA: Diagnosis not present

## 2017-12-06 DIAGNOSIS — R69 Illness, unspecified: Secondary | ICD-10-CM | POA: Diagnosis not present

## 2017-12-08 ENCOUNTER — Ambulatory Visit: Payer: Self-pay

## 2017-12-08 ENCOUNTER — Other Ambulatory Visit: Payer: Self-pay

## 2017-12-15 DIAGNOSIS — R69 Illness, unspecified: Secondary | ICD-10-CM | POA: Diagnosis not present

## 2017-12-15 DIAGNOSIS — F411 Generalized anxiety disorder: Secondary | ICD-10-CM | POA: Diagnosis not present

## 2017-12-18 ENCOUNTER — Ambulatory Visit: Payer: Medicare HMO | Admitting: Nurse Practitioner

## 2017-12-19 ENCOUNTER — Encounter: Payer: Self-pay | Admitting: Certified Nurse Midwife

## 2017-12-19 ENCOUNTER — Other Ambulatory Visit: Payer: Self-pay | Admitting: Certified Nurse Midwife

## 2017-12-19 ENCOUNTER — Other Ambulatory Visit: Payer: Self-pay

## 2017-12-19 ENCOUNTER — Other Ambulatory Visit (HOSPITAL_COMMUNITY)
Admission: RE | Admit: 2017-12-19 | Discharge: 2017-12-19 | Disposition: A | Discharge status: Home / Self Care | Payer: Medicare HMO | Source: Ambulatory Visit | Attending: Certified Nurse Midwife | Admitting: Certified Nurse Midwife

## 2017-12-19 ENCOUNTER — Ambulatory Visit (INDEPENDENT_AMBULATORY_CARE_PROVIDER_SITE_OTHER): Payer: Medicare HMO | Admitting: Certified Nurse Midwife

## 2017-12-19 VITALS — BP 136/70 | HR 76 | Resp 16 | Ht 61.0 in | Wt 130.0 lb

## 2017-12-19 DIAGNOSIS — Z01419 Encounter for gynecological examination (general) (routine) without abnormal findings: Secondary | ICD-10-CM

## 2017-12-19 DIAGNOSIS — Z87898 Personal history of other specified conditions: Secondary | ICD-10-CM | POA: Insufficient documentation

## 2017-12-19 DIAGNOSIS — E2839 Other primary ovarian failure: Secondary | ICD-10-CM

## 2017-12-19 DIAGNOSIS — Z124 Encounter for screening for malignant neoplasm of cervix: Secondary | ICD-10-CM

## 2017-12-19 DIAGNOSIS — Z8742 Personal history of other diseases of the female genital tract: Secondary | ICD-10-CM

## 2017-12-19 NOTE — Patient Instructions (Signed)

## 2017-12-19 NOTE — Progress Notes (Signed)
67 y.o. [id number] Married  Caucasian Fe here for annual exam. Menopausal no HRT. Denies vaginal bleeding or vaginal dryness. Patient relates seeing blood on towel with unknown source. Saw none with wiping from rectum and vagina. Patient sees PCP Dr. Stephanie Acre yearly for hypertension management/labs and aex. Sees Dr. Hedda Slade Psychiatry for Abilify management every 2-3 months. Has appointment soon due to increase in symptoms. Feels she needs a change in medication for anxiety. Has  Not connected with friends in a while. No other health concerns today.  Patient's last menstrual period was 10/14/2002 (approximate).          Sexually active: Yes.    The current method of family planning is vasectomy and post menopausal status.    Exercising: Yes.    walking and weights Smoker:  No, former smoker  Health Maintenance: Pap:  12-04-15 neg HPV HR neg History of Abnormal Pap: yes, years ago MMG:  07-21-16 category c density birads 1:neg -- scheduled 12/22/17 Self Breast exams: no Colonoscopy:  2015 hyperplastic polyp f/u 70yrs BMD:   2016 TDaP:  2015 Shingles: none Pneumonia: none Hep C and HIV: both neg 2017 Labs: PCP   reports that she quit smoking about 13 years ago. Her smoking use included cigarettes. She has a 14.00 pack-year smoking history. she has never used smokeless tobacco. She reports that she drinks alcohol. She reports that she does not use drugs.  Past Medical History:  Diagnosis Date  . Abnormal Pap smear 1994   --CIN I  . Allergic rhinitis   . Anxiety   . Dense breasts   . Endocervical polyp 01/1993   -benign  . Hypercholesterolemia   . Hypertension   . Osteopenia     Past Surgical History:  Procedure Laterality Date  . CERVICAL BIOPSY  W/ LOOP ELECTRODE EXCISION  03/1993   CIN I  . COLONOSCOPY  2016  . endocervical polyp  01-27-93   -benign  . TOOTH EXTRACTION  08/2017    Current Outpatient Medications  Medication Sig Dispense Refill  . ARIPiprazole (ABILIFY) 2 MG tablet  Take 2 mg by mouth daily.    Marland Kitchen atenolol (TENORMIN) 50 MG tablet Take 50 mg by mouth daily.      No current facility-administered medications for this visit.     Family History  Problem Relation Age of Onset  . Stroke Father 56  . Kidney disease Father   . Hypertension Father   . Hypertension Sister 29  . Hyperlipidemia Sister   . COPD Sister   . Breast cancer Sister 35  . Thyroid disease Mother 61  . Heart failure Mother   . Heart disease Mother   . Hypertension Mother   . COPD Mother   . Heart disease Brother 72       AFIB  . Suicidality Son 39       07/20/2013  . Thyroid disease Sister 59    ROS:  Pertinent items are noted in HPI.  Otherwise, a comprehensive ROS was negative.  Exam:   BP 136/70 (BP Location: Right Arm, Patient Position: Sitting, Cuff Size: Normal)   Pulse 76   Resp 16   Ht 5\' 1"  (1.549 m)   Wt 130 lb (59 kg)   LMP 10/14/2002 (Approximate)   BMI 24.56 kg/m  Height: 5\' 1"  (154.9 cm) Ht Readings from Last 3 Encounters:  12/19/17 5\' 1"  (1.549 m)  11/22/17 5\' 2"  (1.575 m)  09/25/17 5\' 2"  (1.575 m)    General appearance: alert,  cooperative and appears stated age Head: Normocephalic, without obvious abnormality, atraumatic Neck: no adenopathy, supple, symmetrical, trachea midline and thyroid normal to inspection and palpation Lungs: clear to auscultation bilaterally Breasts: normal appearance, no masses or tenderness, No nipple retraction or dimpling, No nipple discharge or bleeding, No axillary or supraclavicular adenopathy Heart: regular rate and rhythm Abdomen: soft, non-tender; no masses,  no organomegaly Extremities: extremities normal, atraumatic, no cyanosis or edema Skin: Skin color, texture, turgor normal. No rashes or lesions Lymph nodes: Cervical, supraclavicular, and axillary nodes normal. No abnormal inguinal nodes palpated Neurologic: Grossly normal   Pelvic: External genitalia:  no lesions              Urethra:  normal appearing  urethra with no masses, tenderness or lesions              Bartholin's and Skene's: normal                 Vagina: normal appearing vagina with normal color and discharge, no lesions              Cervix: multiparous appearance, no cervical motion tenderness and no lesions              Pap taken: Yes.   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  Continue follow up with MD's as indicated  Discussed importance of mammogram and BMD. Patient will schedule  Pap smear: yes   counseled on breast self exam, mammography screening, feminine hygiene, adequate intake of calcium and vitamin D, diet and exercise  return annually or prn  An After Visit Summary was printed and given to the patient.

## 2017-12-20 DIAGNOSIS — R69 Illness, unspecified: Secondary | ICD-10-CM | POA: Diagnosis not present

## 2017-12-20 DIAGNOSIS — F411 Generalized anxiety disorder: Secondary | ICD-10-CM | POA: Diagnosis not present

## 2017-12-21 DIAGNOSIS — R69 Illness, unspecified: Secondary | ICD-10-CM | POA: Diagnosis not present

## 2017-12-21 LAB — CYTOLOGY - PAP
Diagnosis: NEGATIVE
HPV: NOT DETECTED

## 2017-12-22 ENCOUNTER — Ambulatory Visit
Admission: RE | Admit: 2017-12-22 | Discharge: 2017-12-22 | Disposition: A | Discharge status: Home / Self Care | Payer: Medicare HMO | Source: Ambulatory Visit | Attending: Certified Nurse Midwife | Admitting: Certified Nurse Midwife

## 2017-12-22 ENCOUNTER — Other Ambulatory Visit: Payer: Self-pay

## 2017-12-22 ENCOUNTER — Ambulatory Visit
Admission: RE | Admit: 2017-12-22 | Discharge: 2017-12-22 | Disposition: A | Discharge status: Home / Self Care | Payer: Medicare HMO | Source: Ambulatory Visit | Attending: Family Medicine | Admitting: Family Medicine

## 2017-12-22 DIAGNOSIS — Z1231 Encounter for screening mammogram for malignant neoplasm of breast: Secondary | ICD-10-CM

## 2017-12-22 DIAGNOSIS — E2839 Other primary ovarian failure: Secondary | ICD-10-CM

## 2017-12-22 DIAGNOSIS — M8589 Other specified disorders of bone density and structure, multiple sites: Secondary | ICD-10-CM | POA: Diagnosis not present

## 2017-12-22 DIAGNOSIS — Z780 Asymptomatic menopausal state: Secondary | ICD-10-CM | POA: Diagnosis not present

## 2017-12-25 DIAGNOSIS — T50995A Adverse effect of other drugs, medicaments and biological substances, initial encounter: Secondary | ICD-10-CM | POA: Diagnosis not present

## 2017-12-25 DIAGNOSIS — J301 Allergic rhinitis due to pollen: Secondary | ICD-10-CM | POA: Diagnosis not present

## 2017-12-25 DIAGNOSIS — J3089 Other allergic rhinitis: Secondary | ICD-10-CM | POA: Diagnosis not present

## 2017-12-28 DIAGNOSIS — F411 Generalized anxiety disorder: Secondary | ICD-10-CM | POA: Diagnosis not present

## 2017-12-28 DIAGNOSIS — R69 Illness, unspecified: Secondary | ICD-10-CM | POA: Diagnosis not present

## 2018-01-04 DIAGNOSIS — F411 Generalized anxiety disorder: Secondary | ICD-10-CM | POA: Diagnosis not present

## 2018-01-04 DIAGNOSIS — R69 Illness, unspecified: Secondary | ICD-10-CM | POA: Diagnosis not present

## 2018-01-05 ENCOUNTER — Encounter: Payer: Self-pay | Admitting: Podiatry

## 2018-01-05 ENCOUNTER — Ambulatory Visit: Payer: Medicare HMO | Admitting: Podiatry

## 2018-01-05 VITALS — BP 128/72 | HR 65 | Resp 16

## 2018-01-05 DIAGNOSIS — L600 Ingrowing nail: Secondary | ICD-10-CM

## 2018-01-05 DIAGNOSIS — B351 Tinea unguium: Secondary | ICD-10-CM | POA: Diagnosis not present

## 2018-01-05 NOTE — Progress Notes (Signed)
   Subjective:    Patient ID: Destiny Elliott, female    DOB: [birth year], 67 y.o.   MRN: [id number]  HPI    Review of Systems  All other systems reviewed and are negative.      Objective:   Physical Exam        Assessment & Plan:

## 2018-01-07 NOTE — Progress Notes (Signed)
Subjective:   Patient ID: Destiny Elliott, female   DOB: 67 y.o.   MRN: [id number]   HPI Patient presents with a thickened left fifth nail that is discolored and she is concerned about this.  Does not remember specific injury  Review of Systems  All other systems reviewed and are negative.       Objective:  Physical Exam  Constitutional: She appears well-developed and well-nourished.  Cardiovascular: Intact distal pulses.  Pulmonary/Chest: Effort normal.  Musculoskeletal: Normal range of motion.  Neurological: She is alert.  Skin: Skin is warm.  Nursing note and vitals reviewed.   Neurovascular status found to be intact muscle strength was adequate range of motion within normal limits with patient found to have rotated fifth digit left foot with thickened nail that is localized with no proximal edema erythema or no drainage or looseness of the nail bed noted.  Patient is found to have good digital perfusion well oriented x3     Assessment:  Structural changes of the left fifth digit with rotated toe causing pressure against the nail bed with discoloration     Plan:  H&P education rendered and discussed.  I do not recommend treatment unless it becomes painful or if she cannot cut it anymore or it becomes loose or infected.  Patient will be seen back as needed

## 2018-01-16 DIAGNOSIS — H6983 Other specified disorders of Eustachian tube, bilateral: Secondary | ICD-10-CM | POA: Diagnosis not present

## 2018-01-16 DIAGNOSIS — H6523 Chronic serous otitis media, bilateral: Secondary | ICD-10-CM | POA: Diagnosis not present

## 2018-01-16 DIAGNOSIS — H903 Sensorineural hearing loss, bilateral: Secondary | ICD-10-CM | POA: Diagnosis not present

## 2018-01-18 DIAGNOSIS — R69 Illness, unspecified: Secondary | ICD-10-CM | POA: Diagnosis not present

## 2018-01-22 DIAGNOSIS — J3089 Other allergic rhinitis: Secondary | ICD-10-CM | POA: Diagnosis not present

## 2018-01-22 DIAGNOSIS — T50995A Adverse effect of other drugs, medicaments and biological substances, initial encounter: Secondary | ICD-10-CM | POA: Diagnosis not present

## 2018-01-22 DIAGNOSIS — J301 Allergic rhinitis due to pollen: Secondary | ICD-10-CM | POA: Diagnosis not present

## 2018-01-25 DIAGNOSIS — F411 Generalized anxiety disorder: Secondary | ICD-10-CM | POA: Diagnosis not present

## 2018-01-25 DIAGNOSIS — R69 Illness, unspecified: Secondary | ICD-10-CM | POA: Diagnosis not present

## 2018-01-31 DIAGNOSIS — F411 Generalized anxiety disorder: Secondary | ICD-10-CM | POA: Diagnosis not present

## 2018-01-31 DIAGNOSIS — R69 Illness, unspecified: Secondary | ICD-10-CM | POA: Diagnosis not present

## 2018-02-08 DIAGNOSIS — F411 Generalized anxiety disorder: Secondary | ICD-10-CM | POA: Diagnosis not present

## 2018-02-08 DIAGNOSIS — R69 Illness, unspecified: Secondary | ICD-10-CM | POA: Diagnosis not present

## 2018-02-09 ENCOUNTER — Ambulatory Visit
Admission: RE | Admit: 2018-02-09 | Discharge: 2018-02-09 | Disposition: A | Discharge status: Home / Self Care | Payer: Medicare HMO | Source: Ambulatory Visit | Attending: Family Medicine | Admitting: Family Medicine

## 2018-02-09 ENCOUNTER — Other Ambulatory Visit: Payer: Self-pay | Admitting: Family Medicine

## 2018-02-09 DIAGNOSIS — R05 Cough: Secondary | ICD-10-CM | POA: Diagnosis not present

## 2018-02-09 DIAGNOSIS — J069 Acute upper respiratory infection, unspecified: Secondary | ICD-10-CM

## 2018-02-13 DIAGNOSIS — H903 Sensorineural hearing loss, bilateral: Secondary | ICD-10-CM | POA: Diagnosis not present

## 2018-02-13 DIAGNOSIS — H6983 Other specified disorders of Eustachian tube, bilateral: Secondary | ICD-10-CM | POA: Diagnosis not present

## 2018-02-15 DIAGNOSIS — R69 Illness, unspecified: Secondary | ICD-10-CM | POA: Diagnosis not present

## 2018-02-26 DIAGNOSIS — R69 Illness, unspecified: Secondary | ICD-10-CM | POA: Diagnosis not present

## 2018-02-27 DIAGNOSIS — R079 Chest pain, unspecified: Secondary | ICD-10-CM | POA: Diagnosis not present

## 2018-02-27 DIAGNOSIS — R69 Illness, unspecified: Secondary | ICD-10-CM | POA: Diagnosis not present

## 2018-02-27 DIAGNOSIS — I10 Essential (primary) hypertension: Secondary | ICD-10-CM | POA: Diagnosis not present

## 2018-03-01 DIAGNOSIS — R69 Illness, unspecified: Secondary | ICD-10-CM | POA: Diagnosis not present

## 2018-03-01 DIAGNOSIS — F411 Generalized anxiety disorder: Secondary | ICD-10-CM | POA: Diagnosis not present

## 2018-03-05 DIAGNOSIS — R69 Illness, unspecified: Secondary | ICD-10-CM | POA: Diagnosis not present

## 2018-03-05 DIAGNOSIS — F411 Generalized anxiety disorder: Secondary | ICD-10-CM | POA: Diagnosis not present

## 2018-03-07 DIAGNOSIS — R69 Illness, unspecified: Secondary | ICD-10-CM | POA: Diagnosis not present

## 2018-03-08 ENCOUNTER — Ambulatory Visit (INDEPENDENT_AMBULATORY_CARE_PROVIDER_SITE_OTHER): Payer: Medicare HMO | Admitting: Otolaryngology

## 2018-03-08 DIAGNOSIS — J310 Chronic rhinitis: Secondary | ICD-10-CM

## 2018-03-08 DIAGNOSIS — M9901 Segmental and somatic dysfunction of cervical region: Secondary | ICD-10-CM | POA: Diagnosis not present

## 2018-03-08 DIAGNOSIS — H6983 Other specified disorders of Eustachian tube, bilateral: Secondary | ICD-10-CM

## 2018-03-08 DIAGNOSIS — M5031 Other cervical disc degeneration,  high cervical region: Secondary | ICD-10-CM | POA: Diagnosis not present

## 2018-03-15 DIAGNOSIS — F411 Generalized anxiety disorder: Secondary | ICD-10-CM | POA: Diagnosis not present

## 2018-03-15 DIAGNOSIS — R69 Illness, unspecified: Secondary | ICD-10-CM | POA: Diagnosis not present

## 2018-03-18 DIAGNOSIS — R69 Illness, unspecified: Secondary | ICD-10-CM | POA: Diagnosis not present

## 2018-03-18 DIAGNOSIS — F418 Other specified anxiety disorders: Secondary | ICD-10-CM | POA: Diagnosis not present

## 2018-03-18 DIAGNOSIS — F419 Anxiety disorder, unspecified: Secondary | ICD-10-CM | POA: Diagnosis not present

## 2018-03-18 DIAGNOSIS — G479 Sleep disorder, unspecified: Secondary | ICD-10-CM | POA: Diagnosis not present

## 2018-03-18 DIAGNOSIS — F332 Major depressive disorder, recurrent severe without psychotic features: Secondary | ICD-10-CM | POA: Diagnosis not present

## 2018-03-20 ENCOUNTER — Encounter (HOSPITAL_COMMUNITY): Payer: Self-pay | Admitting: *Deleted

## 2018-03-20 ENCOUNTER — Other Ambulatory Visit: Payer: Self-pay

## 2018-03-20 ENCOUNTER — Inpatient Hospital Stay (HOSPITAL_COMMUNITY)
Admission: RE | Admit: 2018-03-20 | Discharge: 2018-03-24 | DRG: 885 | Disposition: A | Discharge status: Home / Self Care | Payer: Medicare HMO | Attending: Psychiatry | Admitting: Psychiatry

## 2018-03-20 DIAGNOSIS — Z823 Family history of stroke: Secondary | ICD-10-CM | POA: Diagnosis not present

## 2018-03-20 DIAGNOSIS — G4700 Insomnia, unspecified: Secondary | ICD-10-CM | POA: Diagnosis present

## 2018-03-20 DIAGNOSIS — Z888 Allergy status to other drugs, medicaments and biological substances status: Secondary | ICD-10-CM | POA: Diagnosis not present

## 2018-03-20 DIAGNOSIS — R69 Illness, unspecified: Secondary | ICD-10-CM | POA: Diagnosis not present

## 2018-03-20 DIAGNOSIS — F319 Bipolar disorder, unspecified: Secondary | ICD-10-CM | POA: Diagnosis present

## 2018-03-20 DIAGNOSIS — E7800 Pure hypercholesterolemia, unspecified: Secondary | ICD-10-CM | POA: Diagnosis present

## 2018-03-20 DIAGNOSIS — M8580 Other specified disorders of bone density and structure, unspecified site: Secondary | ICD-10-CM | POA: Diagnosis present

## 2018-03-20 DIAGNOSIS — Z825 Family history of asthma and other chronic lower respiratory diseases: Secondary | ICD-10-CM

## 2018-03-20 DIAGNOSIS — Z8249 Family history of ischemic heart disease and other diseases of the circulatory system: Secondary | ICD-10-CM

## 2018-03-20 DIAGNOSIS — Z841 Family history of disorders of kidney and ureter: Secondary | ICD-10-CM | POA: Diagnosis not present

## 2018-03-20 DIAGNOSIS — Z803 Family history of malignant neoplasm of breast: Secondary | ICD-10-CM

## 2018-03-20 DIAGNOSIS — I10 Essential (primary) hypertension: Secondary | ICD-10-CM | POA: Diagnosis present

## 2018-03-20 DIAGNOSIS — Z818 Family history of other mental and behavioral disorders: Secondary | ICD-10-CM | POA: Diagnosis not present

## 2018-03-20 DIAGNOSIS — F419 Anxiety disorder, unspecified: Secondary | ICD-10-CM | POA: Diagnosis present

## 2018-03-20 DIAGNOSIS — R45851 Suicidal ideations: Secondary | ICD-10-CM | POA: Diagnosis not present

## 2018-03-20 DIAGNOSIS — F3163 Bipolar disorder, current episode mixed, severe, without psychotic features: Secondary | ICD-10-CM | POA: Diagnosis not present

## 2018-03-20 DIAGNOSIS — Z8349 Family history of other endocrine, nutritional and metabolic diseases: Secondary | ICD-10-CM | POA: Diagnosis not present

## 2018-03-20 DIAGNOSIS — Z87891 Personal history of nicotine dependence: Secondary | ICD-10-CM | POA: Diagnosis not present

## 2018-03-20 DIAGNOSIS — Z87410 Personal history of cervical dysplasia: Secondary | ICD-10-CM | POA: Diagnosis not present

## 2018-03-20 DIAGNOSIS — Z79899 Other long term (current) drug therapy: Secondary | ICD-10-CM

## 2018-03-20 DIAGNOSIS — Z780 Asymptomatic menopausal state: Secondary | ICD-10-CM | POA: Diagnosis not present

## 2018-03-20 MED ORDER — LOPERAMIDE HCL 2 MG PO CAPS: 2.0000 mg | ORAL_CAPSULE | ORAL | Status: AC | PRN

## 2018-03-20 MED ORDER — CHLORDIAZEPOXIDE HCL 25 MG PO CAPS
25.0000 mg | ORAL_CAPSULE | Freq: Four times a day (QID) | ORAL | Status: DC | PRN
  Administered 2018-03-21: 25 mg via ORAL
  Filled 2018-03-20: qty 1

## 2018-03-20 MED ORDER — VITAMIN B-1 100 MG PO TABS
100.0000 mg | ORAL_TABLET | Freq: Every day | ORAL | Status: DC
  Administered 2018-03-21 – 2018-03-24 (×4): 100 mg via ORAL
  Filled 2018-03-20 (×7): qty 1

## 2018-03-20 MED ORDER — HYDROXYZINE HCL 25 MG PO TABS
25.0000 mg | ORAL_TABLET | Freq: Four times a day (QID) | ORAL | Status: AC | PRN
  Administered 2018-03-21 – 2018-03-22 (×2): 25 mg via ORAL
  Filled 2018-03-20: qty 1

## 2018-03-20 MED ORDER — ONDANSETRON 4 MG PO TBDP: 4.0000 mg | ORAL_TABLET | Freq: Four times a day (QID) | ORAL | Status: AC | PRN

## 2018-03-20 NOTE — Tx Team (Signed)
Initial Treatment Plan 03/20/2018 7:29 PM Destiny Elliott TZG:[id number]    PATIENT STRESSORS: Educational concerns Financial difficulties Health problems   PATIENT STRENGTHS: Ability for insight Active sense of humor Average or above average intelligence   PATIENT IDENTIFIED PROBLEMS: Bipolar with SI              " I feel so guilty"" I don't know what to do"       DISCHARGE CRITERIA:  Ability to meet basic life and health needs Adequate post-discharge living arrangements Improved stabilization in mood, thinking, and/or behavior  PRELIMINARY DISCHARGE PLAN: Attend aftercare/continuing care group Attend PHP/IOP Attend 12-step recovery group  PATIENT/FAMILY INVOLVEMENT: This treatment plan has been presented to and reviewed with the patient, Destiny Elliott, and/or family member, .  The patient and family have been given the opportunity to ask questions and make suggestions.  Lauralyn Primes, RN 03/20/2018, 7:29 PM

## 2018-03-20 NOTE — [facility] Group Notes (Signed)
Adult Psychoeducational Group Note  Date:  03/20/2018 Time:  11:53 PM  Group Topic/Focus:  Wrap-Up Group:   The focus of this group is to help patients review their daily goal of treatment and discuss progress on daily workbooks.  Participation Level:  Active  Participation Quality:  Appropriate and Attentive  Affect:  Appropriate  Cognitive:  Alert and Appropriate  Insight: Appropriate and Good  Engagement in Group:  Engaged  Modes of Intervention:  Discussion and Education  Additional Comments:  Pt attended and participated in wrap up group this evening. Pt had an okay day, due to them being in the dayroom and going to some groups while they have been here. Pt goal was to find coping skills for stress and get their meds regulated.   Cristi Loron 03/20/2018, 11:53 PM

## 2018-03-20 NOTE — H&P (Signed)
Behavioral Health Medical Screening Exam  Destiny Elliott is an 68 y.o. female presents [facility] as walk in; brought in by her husband and daughter with complaints of worsening anxiety, suicidal ideation, and Benzo withdrawal.  Patient is unable to contract for safety.   Total Time spent with patient: 45 minutes  Psychiatric Specialty Exam: Physical Exam  Vitals reviewed. Constitutional: She is oriented to person, place, and time. She appears well-developed and well-nourished.  HENT:  Head: Normocephalic.  Neck: Normal range of motion. Neck supple.  Respiratory: Effort normal.  Musculoskeletal: Normal range of motion.  Neurological: She is alert and oriented to person, place, and time.  Skin: Skin is warm and dry.  Psychiatric: Her speech is normal and behavior is normal. Judgment normal. Her mood appears anxious. Thought content is not paranoid and not delusional. Cognition and memory are normal. She expresses suicidal ideation. She expresses no homicidal ideation. She expresses no suicidal plans.    Review of Systems  Psychiatric/Behavioral: Positive for suicidal ideas. Negative for hallucinations and substance abuse. The patient is nervous/anxious. The patient does not have insomnia.   All other systems reviewed and are negative.   Blood pressure (!) 166/100, pulse 67, temperature 98.1 F (36.7 C), resp. rate 16, last menstrual period 10/14/2002, SpO2 100 %.There is no height or weight on file to calculate BMI.  General Appearance: Casual and Neat  Eye Contact:  Good  Speech:  Clear and Coherent and Normal Rate  Volume:  Normal  Mood:  Anxious and Depressed  Affect:  Congruent  Thought Process:  Coherent and Goal Directed  Orientation:  Full (Time, Place, and Person)  Thought Content:  Logical  Suicidal Thoughts:  Yes.  without intent/plan  Homicidal Thoughts:  No  Memory:  Immediate;   Good Recent;   Good Remote;   Good  Judgement:  Intact  Insight:  Present  Psychomotor  Activity:  Normal  Concentration: Concentration: Fair and Attention Span: Fair  Recall:  Good  Fund of Knowledge:Fair  Language: Good  Akathisia:  No  Handed:  Right  AIMS (if indicated):     Assets:  Communication Skills Desire for Improvement Housing Physical Health Social Support Transportation  Sleep:       Musculoskeletal: Strength & Muscle Tone: within normal limits Gait & Station: normal Patient leans: N/A  Blood pressure (!) 166/100, pulse 67, temperature 98.1 F (36.7 C), resp. rate 16, last menstrual period 10/14/2002, SpO2 100 %.  Recommendations:  Inpatient psychiatric treatment  Based on my evaluation the patient does not appear to have an emergency medical condition.  Almina Schul, NP 03/20/2018, 12:54 PM
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
## 2018-03-21 DIAGNOSIS — F3163 Bipolar disorder, current episode mixed, severe, without psychotic features: Secondary | ICD-10-CM

## 2018-03-21 DIAGNOSIS — R45851 Suicidal ideations: Secondary | ICD-10-CM

## 2018-03-21 DIAGNOSIS — F419 Anxiety disorder, unspecified: Secondary | ICD-10-CM

## 2018-03-21 LAB — LIPID PANEL
Cholesterol: 163 mg/dL (ref 0–200)
HDL: 49 mg/dL (ref >40)
LDL Cholesterol: 99 mg/dL (ref 0–99)
Total CHOL/HDL Ratio: 3.3 RATIO
Triglycerides: 77 mg/dL (ref <150)
VLDL: 15 mg/dL (ref 0–40)

## 2018-03-21 LAB — COMPREHENSIVE METABOLIC PANEL
ALT: 18 U/L (ref 14–54)
AST: 27 U/L (ref 15–41)
Albumin: 4.5 g/dL (ref 3.5–5.0)
Alkaline Phosphatase: 74 U/L (ref 38–126)
Anion gap: 14 (ref 5–15)
BUN: 12 mg/dL (ref 6–20)
CO2: 25 mmol/L (ref 22–32)
Calcium: 9.5 mg/dL (ref 8.9–10.3)
Chloride: 101 mmol/L (ref 101–111)
Creatinine, Ser: 0.6 mg/dL (ref 0.44–1.00)
GFR calc Af Amer: >60 mL/min (ref >60)
GFR calc non Af Amer: >60 mL/min (ref >60)
Glucose, Bld: 112 mg/dL — ABNORMAL HIGH (ref 65–99)
Potassium: 3.6 mmol/L (ref 3.5–5.1)
Sodium: 140 mmol/L (ref 135–145)
Total Bilirubin: 1 mg/dL (ref 0.3–1.2)
Total Protein: 8.2 g/dL — ABNORMAL HIGH (ref 6.5–8.1)

## 2018-03-21 LAB — CBC
HCT: 43.2 % (ref 36.0–46.0)
Hemoglobin: 14.4 g/dL (ref 12.0–15.0)
MCH: 30 pg (ref 26.0–34.0)
MCHC: 33.3 g/dL (ref 30.0–36.0)
MCV: 90 fL (ref 78.0–100.0)
Platelets: 353 10*3/uL (ref 150–400)
RBC: 4.8 MIL/uL (ref 3.87–5.11)
RDW: 13.9 % (ref 11.5–15.5)
WBC: 9.1 10*3/uL (ref 4.0–10.5)

## 2018-03-21 LAB — URINALYSIS, COMPLETE (UACMP) WITH MICROSCOPIC
Bacteria, UA: NONE SEEN
Bilirubin Urine: NEGATIVE
Glucose, UA: NEGATIVE mg/dL
Ketones, ur: NEGATIVE mg/dL
Leukocytes, UA: NEGATIVE
Nitrite: NEGATIVE
Protein, ur: NEGATIVE mg/dL
Specific Gravity, Urine: 1.006 (ref 1.005–1.030)
pH: 6 (ref 5.0–8.0)

## 2018-03-21 LAB — RAPID URINE DRUG SCREEN, HOSP PERFORMED
Amphetamines: NOT DETECTED
Barbiturates: NOT DETECTED
Benzodiazepines: NOT DETECTED
Cocaine: NOT DETECTED
Opiates: NOT DETECTED
Tetrahydrocannabinol: NOT DETECTED

## 2018-03-21 LAB — HEMOGLOBIN A1C
Hgb A1c MFr Bld: 5.5 % (ref 4.8–5.6)
Mean Plasma Glucose: 111.15 mg/dL

## 2018-03-21 LAB — TSH: TSH: 2.129 u[IU]/mL (ref 0.350–4.500)

## 2018-03-21 LAB — ETHANOL: Alcohol, Ethyl (B): <10 mg/dL (ref <10)

## 2018-03-21 MED ORDER — ATENOLOL 50 MG PO TABS
50.0000 mg | ORAL_TABLET | Freq: Every day | ORAL | Status: DC
  Administered 2018-03-21 – 2018-03-24 (×4): 50 mg via ORAL
  Filled 2018-03-21: qty 7
  Filled 2018-03-21 (×4): qty 1
  Filled 2018-03-21: qty 2
  Filled 2018-03-21: qty 7
  Filled 2018-03-21: qty 1

## 2018-03-21 MED ORDER — TRAZODONE HCL 100 MG PO TABS
100.0000 mg | ORAL_TABLET | Freq: Every day | ORAL | Status: DC
  Administered 2018-03-21: 100 mg via ORAL
  Filled 2018-03-21 (×2): qty 1

## 2018-03-21 MED ORDER — ARIPIPRAZOLE 2 MG PO TABS
2.0000 mg | ORAL_TABLET | Freq: Every day | ORAL | Status: DC
  Administered 2018-03-21 – 2018-03-22 (×2): 2 mg via ORAL
  Filled 2018-03-21 (×5): qty 1

## 2018-03-21 MED ORDER — TRIAMCINOLONE ACETONIDE 55 MCG/ACT NA AERO
1.0000 | INHALATION_SPRAY | Freq: Every day | NASAL | Status: DC
  Filled 2018-03-21: qty 10.8

## 2018-03-21 NOTE — Progress Notes (Signed)
Recreation Therapy Notes  Date: 5.8.19 Time: 0930 Location: [address] Dayroom  Group Topic: Stress Management  Goal Area(s) Addresses:  Patient will verbalize importance of using healthy stress management.  Patient will identify positive emotions associated with healthy stress management.   Intervention: Stress Management  Activity :  Meditation.  LRT introduced the stress management technique of meditation.  LRT played a meditation that focused on the strength and resilience of mountains and how those characteristics can be used in daily life.  Patients were to follow along as meditation played.  Education:  Stress Management, Discharge Planning.   Education Outcome: Acknowledges edcuation/In group clarification offered/Needs additional education  Clinical Observations/Feedback: Pt did not attend group.      Jaima Janney Linday, LRT/CTRS         Victorino Sparrow A 03/21/2018 11:49 AM

## 2018-03-21 NOTE — B[facility] Counselor (Signed)
Adult Comprehensive Assessment  Patient ID: Destiny Elliott, female   DOB: 02-21-1951, 67 y.o.   MRN: [id number] Information Source: Information source: Patient  Current Stressors: Pt reports medication changes made by her outpt psychiatrist have led to significant side effects and anxiety Social relationships: Pt's marriage has been somewhat strained by pt's mental health concerns  Bereavement / Loss: Pt's son completed suicide 4 years ago, his birthday is approaching, which is always difficult.  Living/Environment/Situation:  Living Arrangements: Spouse/significant other Living conditions (as described by patient or guardian): Pt lives with her husband  How long has patient lived in current situation?: 70 years  What is atmosphere in current home: Comfortable  Family History:  Marital status: Married Number of Years Married: 14 What types of issues is patient dealing with in the relationship?: Lack of sleep has made both of them more irritable, and pt's struggle with her mental health has also caused a strain in their relationship. But pt states that she loves her husband and they have a good life together.  Does patient have children?: Yes How many children?: 2(1 son, 1 daughter) How is patient's relationship with their children?: Pt's son completed suicide 4 years ago and this is still a struggle for the pt, pt reports having a great relationship with her daughter  Sexually active: yes Sexual orientation: heterosexual  Childhood History:  By whom was/is the patient raised?: Both parents, Mother Additional childhood history information: Pt's father died when she was 81 yo and after this pt was primarily raised by her mother  Description of patient's relationship with caregiver when they were a child: Pt reports having a good relationship with her mother  Patient's description of current relationship with people who raised him/her: Pt's mother is now deceased  Does patient have  siblings?: Yes Number of Siblings: 4(1 brother, 3 sisters) Description of patient's current relationship with siblings: Close relationship with all her sibling; pt states that she did have another older brother but he passed away Did patient suffer any verbal/emotional/physical/sexual abuse as a child?: No Did patient suffer from severe childhood neglect?: No Has patient ever been sexually abused/assaulted/raped as an adolescent or adult?: No Was the patient ever a victim of a crime or a disaster?: No Witnessed domestic violence?: No Has patient been effected by domestic violence as an adult?: No  Education:  Highest grade of school patient has completed: High school graduate  Currently a student?: No Learning disability?: No  Employment/Work Situation:   Employment situation: Employed 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 Describe how patient's job has been impacted: Pt's daughter has had to assume a lot of pt's responsiblities while pt is struggling with her mental health What is the longest time patient has a held a job?: Current job Where was the patient employed at that time?: Current job Has patient ever been in the [employer]?: No Has patient ever served in Recruitment consultant?: No Did You Receive Any Psychiatric Treatment/Services While in Passenger transport manager?: (NA) Are There Guns or Other Weapons in [place]?: None Are These Weapons Safely Secured?: (NA)  Financial Resources:   Financial resources: Income from employment, private insurance  Alcohol/Substance Abuse:   What has been your use of drugs/alcohol within the last 12 months?: Pt denies any use. Alcohol/Substance  Abuse Treatment Hx: Denies past history  Social Support System:   Heritage manager System:  Manufacturing engineer System: Husband, daughter, siblings, some friends  Type of faith/religion: Darrick Meigs  How does patient's faith help to cope with current illness?: Attends church   Leisure/Recreation:   Leisure and Hobbies: Chief Financial Officer shopping, walking, reading books, jigsaw puzzles, spending time with her granddaughter   Strengths/Needs:   What things does the patient do well?: Bowling, staying active, taking care of others, Geneology  In what areas does patient struggle / problems for patient: current medication issues  Discharge Plan:   Does patient have access to transportation?: Yes(Pt's husband will transport) Will patient be returning to same living situation after discharge?: Yes Currently receiving community mental health services: Yes (From Whom)(Destiny Elliott [facility] therapist )Pt wants to stop seeing Dr Casimiro Needle and reports her daughter has contacted Dr Darleene Cleaver and arranged an appt there. If no, would patient like referral for services when discharged?: No Does patient have financial barriers related to discharge medications?: No     Summary/Recommendations:   Summary and Recommendations (to be completed by the evaluator): Pt is 67 year old female from [place]. [facility])  Pt is diagnosed with bipolar disorder and was admitted due to increased depression, anxiety, and suicidal ideation. Pt reports problems with side effects related to medication changes made by her outpatient provider.  Recommendations for pt include crisis stablization, therapeutic milieu, attend and participate in groups, medication management, and development of comprhensive mental wellness plan.  Joanne Chars. 03/21/2018

## 2018-03-21 NOTE — [facility] Group Notes (Signed)
Adult Psychoeducational Group Note  Date:  03/21/2018 Time:  11:17 PM  Group Topic/Focus:  Wrap-Up Group:   The focus of this group is to help patients review their daily goal of treatment and discuss progress on daily workbooks.  Participation Level:  Active  Participation Quality:  Appropriate and Attentive  Affect:  Appropriate  Cognitive:  Alert and Appropriate  Insight: Appropriate and Good  Engagement in Group:  Engaged  Modes of Intervention:  Discussion and Education  Additional Comments:  Pt attended and participated in wrap up group this evening. Pt did not sleep well last night, but they have since gotten their night meds regulated, which was the pt goal.   Cristi Loron 03/21/2018, 11:17 PM

## 2018-03-21 NOTE — Tx Team (Signed)
Interdisciplinary Treatment and Diagnostic Plan Update  03/21/2018 Time of Session: [place] MRN: [id number]  Principal Diagnosis: <principal problem not specified>  Secondary Diagnoses: Active Problems:   Bipolar disorder ([place])   Current Medications:  Current Facility-Administered Medications  Medication Dose Route Frequency Provider Last Rate Last Dose  . ARIPiprazole (ABILIFY) tablet 2 mg  2 mg Oral Daily Sharma Covert, MD   2 mg at 03/21/18 1158  . atenolol (TENORMIN) tablet 50 mg  50 mg Oral Daily Sharma Covert, MD   50 mg at 03/21/18 1158  . chlordiazePOXIDE (LIBRIUM) capsule 25 mg  25 mg Oral Q6H PRN Rankin, Shuvon B, NP   25 mg at 03/21/18 0809  . hydrOXYzine (ATARAX/VISTARIL) tablet 25 mg  25 mg Oral Q6H PRN Rankin, Shuvon B, NP   25 mg at 03/21/18 1159  . loperamide (IMODIUM) capsule 2-4 mg  2-4 mg Oral PRN Rankin, Shuvon B, NP      . ondansetron (ZOFRAN-ODT) disintegrating tablet 4 mg  4 mg Oral Q6H PRN Rankin, Shuvon B, NP      . thiamine (VITAMIN B-1) tablet 100 mg  100 mg Oral Daily Rankin, Shuvon B, NP   100 mg at 03/21/18 0809   PTA Medications: Medications Prior to Admission  Medication Sig Dispense Refill Last Dose  . atenolol (TENORMIN) 50 MG tablet Take 50 mg by mouth daily.    03/20/2018 at [facility]  . carbamazepine (TEGRETOL) 100 MG chewable tablet Chew 50 mg by mouth every morning.    03/19/2018 at Unknown time  . LORazepam (ATIVAN) 0.5 MG tablet Take 0.5 mg by mouth 3 (three) times daily.    03/18/2018  . mupirocin ointment (BACTROBAN) 2 % Place 1 application into the nose 3 (three) times daily.   Past Month at Unknown time    Patient Stressors: Educational concerns Financial difficulties Health problems  Patient Strengths: Ability for insight Active sense of humor Average or above average intelligence  Treatment Modalities: Medication Management, Group therapy, Case management,  1 to 1 session with clinician, Psychoeducation, Recreational  therapy.   Physician Treatment Plan for Primary Diagnosis: <principal problem not specified> Long Term Goal(s): Improvement in symptoms so as ready for discharge Improvement in symptoms so as ready for discharge   Short Term Goals: Ability to identify changes in lifestyle to reduce recurrence of condition will improve Ability to verbalize feelings will improve Ability to disclose and discuss suicidal ideas Ability to demonstrate self-control will improve Ability to identify and develop effective coping behaviors will improve Ability to maintain clinical measurements within normal limits will improve Compliance with prescribed medications will improve Ability to identify changes in lifestyle to reduce recurrence of condition will improve Ability to verbalize feelings will improve Ability to disclose and discuss suicidal ideas Ability to demonstrate self-control will improve Ability to identify and develop effective coping behaviors will improve Ability to maintain clinical measurements within normal limits will improve Compliance with prescribed medications will improve  Medication Management: Evaluate patient's response, side effects, and tolerance of medication regimen.  Therapeutic Interventions: 1 to 1 sessions, Unit Group sessions and Medication administration.  Evaluation of Outcomes: Not Met  Physician Treatment Plan for Secondary Diagnosis: Active Problems:   Bipolar disorder ([place])  Long Term Goal(s): Improvement in symptoms so as ready for discharge Improvement in symptoms so as ready for discharge   Short Term Goals: Ability to identify changes in lifestyle to reduce recurrence of condition will improve Ability to verbalize feelings will improve  Ability to disclose and discuss suicidal ideas Ability to demonstrate self-control will improve Ability to identify and develop effective coping behaviors will improve Ability to maintain clinical measurements within normal  limits will improve Compliance with prescribed medications will improve Ability to identify changes in lifestyle to reduce recurrence of condition will improve Ability to verbalize feelings will improve Ability to disclose and discuss suicidal ideas Ability to demonstrate self-control will improve Ability to identify and develop effective coping behaviors will improve Ability to maintain clinical measurements within normal limits will improve Compliance with prescribed medications will improve     Medication Management: Evaluate patient's response, side effects, and tolerance of medication regimen.  Therapeutic Interventions: 1 to 1 sessions, Unit Group sessions and Medication administration.  Evaluation of Outcomes: Not Met   RN Treatment Plan for Primary Diagnosis: <principal problem not specified> Long Term Goal(s): Knowledge of disease and therapeutic regimen to maintain health will improve  Short Term Goals: Ability to identify and develop effective coping behaviors will improve and Compliance with prescribed medications will improve  Medication Management: RN will administer medications as ordered by provider, will assess and evaluate patient's response and provide education to patient for prescribed medication. RN will report any adverse and/or side effects to prescribing provider.  Therapeutic Interventions: 1 on 1 counseling sessions, Psychoeducation, Medication administration, Evaluate responses to treatment, Monitor vital signs and CBGs as ordered, Perform/monitor CIWA, COWS, AIMS and Fall Risk screenings as ordered, Perform wound care treatments as ordered.  Evaluation of Outcomes: Not Met   LCSW Treatment Plan for Primary Diagnosis: <principal problem not specified> Long Term Goal(s): Safe transition to appropriate next level of care at discharge, Engage patient in therapeutic group addressing interpersonal concerns.  Short Term Goals: Engage patient in aftercare planning  with referrals and resources, Increase social support and Increase skills for wellness and recovery  Therapeutic Interventions: Assess for all discharge needs, 1 to 1 time with Social worker, Explore available resources and support systems, Assess for adequacy in community support network, Educate family and significant other(s) on suicide prevention, Complete Psychosocial Assessment, Interpersonal group therapy.  Evaluation of Outcomes: Not Met   Progress in Treatment: Attending groups: Yes. Participating in groups: Yes. Taking medication as prescribed: Yes. Toleration medication: Yes. Family/Significant other contact made: No, will contact:  daughter Patient understands diagnosis: Yes. Discussing patient identified problems/goals with staff: Yes. Medical problems stabilized or resolved: Yes. Denies suicidal/homicidal ideation: Yes. Issues/concerns per patient self-inventory: No. Other: none  New problem(s) identified: No, Describe:  none  New Short Term/Long Term Goal(s):Pt goal: get on the correct medicine.  Discharge Plan or Barriers:   Reason for Continuation of Hospitalization: Anxiety Depression Medication stabilization  Estimated Length of Stay: 3-5 days.  Attendees: Patient: Destiny Elliott 03/21/2018   Physician: Dr Mallie Darting, MD 03/21/2018   Nursing: Darrol Angel, RN 03/21/2018   RN Care Manager: 03/21/2018   Social Worker: Lurline Idol, LCSW 03/21/2018   Recreational Therapist:  03/21/2018  Other:  03/21/2018   Other:  03/21/2018   Other: 03/21/2018        Scribe for Treatment Team: Joanne Chars, [place] 03/21/2018 12:52 PM

## 2018-03-21 NOTE — H&P (Signed)
Psychiatric Admission Assessment Adult  Patient Identification: Destiny Elliott MRN:  [id number] Date of Evaluation:  03/21/2018 Chief Complaint:  bipolar disorder Principal Diagnosis: <principal problem not specified> Diagnosis:   Patient Active Problem List   Diagnosis Date Noted  . Bipolar disorder ([place]) [F31.9] 03/20/2018  . Bipolar disorder, unspecified ([place]) [F31.9] 09/26/2017   History of Present Illness: Patient is seen and examined.  Patient is a 67 year old female with a reported past psychiatric history significant for bipolar disorder who presented to the behavioral health hospital with suicidal ideation.  She is being followed by an outpatient psychiatrist, and back in November recommended coming off of Abilify.  She is unsure exactly why he wanted her off of it.  She started having worsening symptoms with regard to insomnia, anxiety etc. in February.  She was placed on Ativan.  She suffered side effects from this.  Attempts to decrease the dosage and adjust the dosage were of no benefit.  She has a list of medications which she is unable to take due to side effects.  We discussed options today to restart the Abilify.  She is conflicted over this because the fact the physician told her that "it is not good to be on long-term".  We discussed options and the patient finally agreed to go back on the Abilify.  She had been hospitalized here in 2018 and was placed on it.  At discharge dosage was only 5 mg at that time.  She was admitted to the hospital for evaluation and stabilization. Associated Signs/Symptoms: Depression Symptoms:  depressed mood, anhedonia, insomnia, psychomotor agitation, fatigue, feelings of worthlessness/guilt, difficulty concentrating, hopelessness, suicidal thoughts without plan, anxiety, loss of energy/fatigue, disturbed sleep, (Hypo) Manic Symptoms:  Impulsivity, Anxiety Symptoms:  Excessive Worry, Psychotic Symptoms:  Denied PTSD  Symptoms: Negative Total Time spent with patient: 45 minutes  Past Psychiatric History: Patient has one previous admission in November 2018.  She was diagnosed with bipolar disorder.  She was discharged on Abilify and trazodone at that time.  She is followed by Dr. Casimiro Needle as an outpatient.  She is been on multiple SSRIs and other antianxiety medicines, and is been unable to tolerate any of those.  Is the patient at risk to self? Yes.    Has the patient been a risk to self in the past 6 months? Yes.    Has the patient been a risk to self within the distant past? No.  Is the patient a risk to others? No.  Has the patient been a risk to others in the past 6 months? No.  Has the patient been a risk to others within the distant past? No.   Prior Inpatient Therapy: Prior Inpatient Therapy: Yes([facility] ) Prior Therapy Dates: Nov. 2018 Prior Therapy Facilty/Provider(s): [facility] Reason for Treatment: SI  Prior Outpatient Therapy: Prior Outpatient Therapy: Yes Prior Therapy Dates: weekly or bi-weekly  Prior Therapy Facilty/Provider(s): August Luz, therapist  Reason for Treatment: anxiety, depression  Does patient have an ACCT team?: No Does patient have Intensive In-House Services?  : No Does patient have [facility] services? : No Does patient have P4CC services?: No  Alcohol Screening: 1. How often do you have a drink containing alcohol?: Never 2. How many drinks containing alcohol do you have on a typical day when you are drinking?: 1 or 2 3. How often do you have six or more drinks on one occasion?: Never AUDIT-C Score: 0 4. How often during the last year have you found that you were not  able to stop drinking once you had started?: Never 5. How often during the last year have you failed to do what was normally expected from you becasue of drinking?: Never 6. How often during the last year have you needed a first drink in the morning to get yourself going after a heavy drinking session?: Never 7.  How often during the last year have you had a feeling of guilt of remorse after drinking?: Never 8. How often during the last year have you been unable to remember what happened the night before because you had been drinking?: Never 9. Have you or someone else been injured as a result of your drinking?: No 10. Has a relative or friend or a doctor or another health worker been concerned about your drinking or suggested you cut down?: No Alcohol Use Disorder Identification Test Final Score (AUDIT): 0 Intervention/Follow-up: Alcohol Education, AUDIT Score <7 follow-up not indicated Substance Abuse History in the last 12 months:  No. Consequences of Substance Abuse: Negative Previous Psychotropic Medications: Yes  Psychological Evaluations: Yes  Past Medical History:  Past Medical History:  Diagnosis Date  . Abnormal Pap smear 1994   --CIN I  . Allergic rhinitis   . Anxiety   . Dense breasts   . Endocervical polyp 01/1993   -benign  . Hypercholesterolemia   . Hypertension   . Osteopenia     Past Surgical History:  Procedure Laterality Date  . CERVICAL BIOPSY  W/ LOOP ELECTRODE EXCISION  03/1993   CIN I  . COLONOSCOPY  2016  . endocervical polyp  01-27-93   -benign  . TOOTH EXTRACTION  08/2017   Family History:  Family History  Problem Relation Age of Onset  . Stroke Father 82  . Kidney disease Father   . Hypertension Father   . Hypertension Sister 27  . Hyperlipidemia Sister   . COPD Sister   . Breast cancer Sister 23  . Thyroid disease Mother 78  . Heart failure Mother   . Heart disease Mother   . Hypertension Mother   . COPD Mother   . Heart disease Brother 56       AFIB  . Suicidality Son 39       07/20/2013  . Thyroid disease Sister 33   Family Psychiatric  History: Son has bipolar disorder committed suicide.  Sisters are on medications for mental health but unsure diagnosis. Tobacco Screening: Have you used any form of tobacco in the last 30 days? (Cigarettes,  Smokeless Tobacco, Cigars, and/or Pipes): No Social History:  Social History   Substance and Sexual Activity  Alcohol Use Yes   Comment: occ wine     Social History   Substance and Sexual Activity  Drug Use No    Additional Social History: Marital status: Married    Pain Medications: see MAR Prescriptions: see MAR Over the Counter: see MAR History of alcohol / drug use?: No history of alcohol / drug abuse Longest period of sobriety (when/how long): denies Negative Consequences of Use: Legal                    Allergies:   Allergies  Allergen Reactions  . Ambien [Zolpidem Tartrate] Other (See Comments)    Causes depression  . Benadryl [Diphenhydramine Hcl]     Causes nasal dryness  . Buspar [Buspirone] Other (See Comments)    "felt like face was drawing down"  . Flonase [Fluticasone Propionate] Other (See Comments)    Patient couldn't  recall what  her reaction was  . Gabapentin Other (See Comments)    Raised blood pressure  . Nickel Itching  . Nortriptyline Other (See Comments)    Causes dry sinuses,dry mouth  . Paxil [Paroxetine Hcl] Other (See Comments)    agitation  . Remeron [Mirtazapine] Other (See Comments)    Agitation and depression  . Tylenol Pm Extra [Diphenhydramine-Apap (Sleep)] Other (See Comments)    Causes nasal dryness  . Vitamin D Analogs     doesn't take Vit D supplements because they make her feel bad   . Zoloft [Sertraline Hcl] Other (See Comments)    agitation   Lab Results:  Results for orders placed or performed during the hospital encounter of 03/20/18 (from the past 48 hour(s))  Urinalysis, Complete w Microscopic     Status: Abnormal   Collection Time: 03/20/18  3:11 PM  Result Value Ref Range   Color, Urine STRAW (A) YELLOW   APPearance CLEAR CLEAR   Specific Gravity, Urine 1.006 1.005 - 1.030   pH 6.0 5.0 - 8.0   Glucose, UA NEGATIVE NEGATIVE mg/dL   Hgb urine dipstick MODERATE (A) NEGATIVE   Bilirubin Urine NEGATIVE  NEGATIVE   Ketones, ur NEGATIVE NEGATIVE mg/dL   Protein, ur NEGATIVE NEGATIVE mg/dL   Nitrite NEGATIVE NEGATIVE   Leukocytes, UA NEGATIVE NEGATIVE   RBC / HPF 0-5 0 - 5 RBC/hpf   WBC, UA 0-5 0 - 5 WBC/hpf   Bacteria, UA NONE SEEN NONE SEEN   Squamous Epithelial / LPF 0-5 0 - 5    Comment: Please note change in reference range.   Mucus PRESENT     Comment: Performed at [facility], [place] [address], [place], [place] [address]  Urine rapid drug screen (hosp performed)not at [facility]     Status: None   Collection Time: 03/20/18  3:11 PM  Result Value Ref Range   Opiates NONE DETECTED NONE DETECTED   Cocaine NONE DETECTED NONE DETECTED   Benzodiazepines NONE DETECTED NONE DETECTED   Amphetamines NONE DETECTED NONE DETECTED   Tetrahydrocannabinol NONE DETECTED NONE DETECTED   Barbiturates NONE DETECTED NONE DETECTED    Comment: (NOTE) DRUG SCREEN FOR MEDICAL PURPOSES ONLY.  IF CONFIRMATION IS NEEDED FOR ANY PURPOSE, NOTIFY LAB WITHIN 5 DAYS. LOWEST DETECTABLE LIMITS FOR URINE DRUG SCREEN Drug Class                     Cutoff (ng/mL) Amphetamine and metabolites    1000 Barbiturate and metabolites    200 Benzodiazepine                 233 Tricyclics and metabolites     300 Opiates and metabolites        300 Cocaine and metabolites        300 THC                            50 Performed at [facility], [place] [address], [place], [place] [address]   CBC     Status: None   Collection Time: 03/21/18  6:43 AM  Result Value Ref Range   WBC 9.1 4.0 - 10.5 K/uL   RBC 4.80 3.87 - 5.11 MIL/uL   Hemoglobin 14.4 12.0 - 15.0 g/dL   HCT 43.2 36.0 - 46.0 %   MCV 90.0 78.0 - 100.0 fL   MCH 30.0 26.0 - 34.0 pg  MCHC 33.3 30.0 - 36.0 g/dL   RDW 13.9 11.5 - 15.5 %   Platelets 353 150 - 400 K/uL    Comment: Performed at [facility], [place] [address], [place], [place] [address]  Comprehensive metabolic panel     Status: Abnormal   Collection  Time: 03/21/18  6:43 AM  Result Value Ref Range   Sodium 140 135 - 145 mmol/L   Potassium 3.6 3.5 - 5.1 mmol/L   Chloride 101 101 - 111 mmol/L   CO2 25 22 - 32 mmol/L   Glucose, Bld 112 (H) 65 - 99 mg/dL   BUN 12 6 - 20 mg/dL   Creatinine, Ser 0.60 0.44 - 1.00 mg/dL   Calcium 9.5 8.9 - 10.3 mg/dL   Total Protein 8.2 (H) 6.5 - 8.1 g/dL   Albumin 4.5 3.5 - 5.0 g/dL   AST 27 15 - 41 U/L   ALT 18 14 - 54 U/L   Alkaline Phosphatase 74 38 - 126 U/L   Total Bilirubin 1.0 0.3 - 1.2 mg/dL   GFR calc non Af Amer >60 >60 mL/min   GFR calc Af Amer >60 >60 mL/min    Comment: (NOTE) The eGFR has been calculated using the CKD EPI equation. This calculation has not been validated in all clinical situations. eGFR's persistently <60 mL/min signify possible Chronic Kidney Disease.    Anion gap 14 5 - 15    Comment: Performed at [facility], [place] [address], [place], [place] [address]  Ethanol     Status: None   Collection Time: 03/21/18  6:43 AM  Result Value Ref Range   Alcohol, Ethyl (B) <10 <10 mg/dL    Comment:        LOWEST DETECTABLE LIMIT FOR SERUM ALCOHOL IS 10 mg/dL FOR MEDICAL PURPOSES ONLY Performed at [facility] [address], [place], [place] [address]   Lipid panel     Status: None   Collection Time: 03/21/18  6:43 AM  Result Value Ref Range   Cholesterol 163 0 - 200 mg/dL   Triglycerides 77 <150 mg/dL   HDL 49 >40 mg/dL   Total CHOL/HDL Ratio 3.3 RATIO   VLDL 15 0 - 40 mg/dL   LDL Cholesterol 99 0 - 99 mg/dL    Comment:        Total Cholesterol/HDL:CHD Risk Coronary Heart Disease Risk Table                     Men   Women  1/2 Average Risk   3.4   3.3  Average Risk       5.0   4.4  2 X Average Risk   9.6   7.1  3 X Average Risk  23.4   11.0        Use the calculated Patient Ratio above and the CHD Risk Table to determine the patient's CHD Risk.        ATP III CLASSIFICATION (LDL):  <100     mg/dL   Optimal  100-129   mg/dL   Near or Above                    Optimal  130-159  mg/dL   Borderline  160-189  mg/dL   High  >190     mg/dL   Very High Performed at [facility] [address] [place], [place] [address]   TSH  Status: None   Collection Time: 03/21/18  6:43 AM  Result Value Ref Range   TSH 2.129 0.350 - 4.500 uIU/mL    Comment: Performed by a 3rd Generation assay with a functional sensitivity of <=0.01 uIU/mL. Performed at [facility], [place] [address], [place],  [address]     Blood Alcohol level:  Lab Results  Component Value Date   ETH <10 03/21/2018   ETH <10 12/75/1700    Metabolic Disorder Labs:  No results found for: HGBA1C, MPG No results found for: PROLACTIN Lab Results  Component Value Date   CHOL 163 03/21/2018   TRIG 77 03/21/2018   HDL 49 03/21/2018   CHOLHDL 3.3 03/21/2018   VLDL 15 03/21/2018   LDLCALC 99 03/21/2018    Current Medications: Current Facility-Administered Medications  Medication Dose Route Frequency Provider Last Rate Last Dose  . ARIPiprazole (ABILIFY) tablet 2 mg  2 mg Oral Daily Sharma Covert, MD      . atenolol (TENORMIN) tablet 50 mg  50 mg Oral Daily Sharma Covert, MD      . chlordiazePOXIDE (LIBRIUM) capsule 25 mg  25 mg Oral Q6H PRN Rankin, Shuvon B, NP   25 mg at 03/21/18 0809  . hydrOXYzine (ATARAX/VISTARIL) tablet 25 mg  25 mg Oral Q6H PRN Rankin, Shuvon B, NP      . loperamide (IMODIUM) capsule 2-4 mg  2-4 mg Oral PRN Rankin, Shuvon B, NP      . ondansetron (ZOFRAN-ODT) disintegrating tablet 4 mg  4 mg Oral Q6H PRN Rankin, Shuvon B, NP      . thiamine (VITAMIN B-1) tablet 100 mg  100 mg Oral Daily Rankin, Shuvon B, NP   100 mg at 03/21/18 0809   PTA Medications: Medications Prior to Admission  Medication Sig Dispense Refill Last Dose  . atenolol (TENORMIN) 50 MG tablet Take 50 mg by mouth daily.    03/20/2018 at [place]  . carbamazepine (TEGRETOL) 100 MG chewable tablet Chew 50 mg  by mouth every morning.    03/19/2018 at Unknown time  . LORazepam (ATIVAN) 0.5 MG tablet Take 0.5 mg by mouth 3 (three) times daily.    03/18/2018  . mupirocin ointment (BACTROBAN) 2 % Place 1 application into the nose 3 (three) times daily.   Past Month at Unknown time    Musculoskeletal: Strength & Muscle Tone: within normal limits Gait & Station: normal Patient leans: N/A  Psychiatric Specialty Exam: Physical Exam  Nursing note and vitals reviewed. Constitutional: She is oriented to person, place, and time. She appears well-developed and well-nourished.  HENT:  Head: Normocephalic and atraumatic.  Respiratory: Effort normal.  Neurological: She is alert and oriented to person, place, and time.    ROS  Blood pressure (!) 144/111, pulse 80, temperature 98.1 F (36.7 C), temperature source Oral, resp. rate 16, height 5' 2"  (1.575 m), weight 54 kg (119 lb), last menstrual period 10/14/2002, SpO2 100 %.Body mass index is 21.77 kg/m.  General Appearance: Casual  Eye Contact:  Fair  Speech:  Normal Rate  Volume:  Normal  Mood:  Anxious  Affect:  Congruent  Thought Process:  Coherent  Orientation:  Full (Time, Place, and Person)  Thought Content:  Logical  Suicidal Thoughts:  Yes.  without intent/plan  Homicidal Thoughts:  No  Memory:  Immediate;   Fair  Judgement:  Impaired  Insight:  Lacking  Psychomotor Activity:  Increased  Concentration:  Concentration: Fair  Recall:  [employer]  of Knowledge:  Fair  Language:  Fair  Akathisia:  Negative  Handed:  Right  AIMS (if indicated):     Assets:  Communication Skills Desire for Improvement Financial Resources/Insurance Housing Social Support  ADL's:  Intact  Cognition:  WNL  Sleep:       Treatment Plan Summary: Daily contact with patient to assess and evaluate symptoms and progress in treatment, Medication management and Plan Patient is seen and examined.  Patient is a 67 year old female with the above-stated past  psychiatric history who developed suicidal ideation, worsening anxiety and insomnia after coming off her Abilify.  She was attempted on several medications.  She has a list of medicine she is unable to tolerate.  I have convinced her to go back on the Abilify.  We will start 2 mg a day and titrate that.  She had previously been on trazodone as well, and we will continue that.  She is on Librium for any withdrawal symptoms from Ativan, and hopefully she will be off the benzodiazepines prior to discharge.  She has been encouraged to go to groups, and did develop some coping skills.  We will continue her atenolol, and she has Nasacort and her own personal possession that she prefers to use for her sinuses.  Observation Level/Precautions:  15 minute checks  Laboratory:  Chemistry Profile  Psychotherapy:    Medications:    Consultations:    Discharge Concerns:    Estimated LOS:  Other:     Physician Treatment Plan for Primary Diagnosis: <principal problem not specified> Long Term Goal(s): Improvement in symptoms so as ready for discharge  Short Term Goals: Ability to identify changes in lifestyle to reduce recurrence of condition will improve, Ability to verbalize feelings will improve, Ability to disclose and discuss suicidal ideas, Ability to demonstrate self-control will improve, Ability to identify and develop effective coping behaviors will improve, Ability to maintain clinical measurements within normal limits will improve and Compliance with prescribed medications will improve  Physician Treatment Plan for Secondary Diagnosis: Active Problems:   Bipolar disorder ([place])  Long Term Goal(s): Improvement in symptoms so as ready for discharge  Short Term Goals: Ability to identify changes in lifestyle to reduce recurrence of condition will improve, Ability to verbalize feelings will improve, Ability to disclose and discuss suicidal ideas, Ability to demonstrate self-control will improve, Ability to  identify and develop effective coping behaviors will improve, Ability to maintain clinical measurements within normal limits will improve and Compliance with prescribed medications will improve  I certify that inpatient services furnished can reasonably be expected to improve the patient's condition.    Sharma Covert, MD 5/8/201910:38 AM

## 2018-03-21 NOTE — [facility] Suicide Risk Assessment (Signed)
[facility] Admission Suicide Risk Assessment   Nursing information obtained from:    Demographic factors:    Current Mental Status:    Loss Factors:    Historical Factors:    Risk Reduction Factors:     Total Time spent with patient: 45 minutes Principal Problem: <principal problem not specified> Diagnosis:   Patient Active Problem List   Diagnosis Date Noted  . Bipolar disorder ([place]) [F31.9] 03/20/2018  . Bipolar disorder, unspecified ([place]) [F31.9] 09/26/2017   Subjective Data: Patient is seen and examined.  Patient is a 67 year old female with a reported history of bipolar disorder who presented to the behavioral health hospital with suicidal ideation.  The patient was accompanied by her husband, daughter with complaints of worsening anxiety, suicidal ideation and benzodiazepine withdrawal.  Patient had been admitted in November 2018 and diagnosed with bipolar disorder.  She was discharged on Abilify.  She was followed by an outpatient psychiatrist to inform the patient that she should not be on Abilify long-term, and prescribed Ativan.  Unfortunately she had side effects from this including thought blocking, rational thoughts, increased anxiety, interrupted sleep patterns and falling asleep while driving.  Her outpatient physician decrease the medication to twice daily, but this did not decrease side effects.  She stopped the medication, but then had increasing anxiety and withdrawal symptoms.  She presented here for evaluation and stabilization.  In November 2018 she was discharged on Abilify 5 mg p.o. daily.  She stated that she felt as though it is been beneficial, and she does not recall having significant side effects to it.  We discussed restarting the Abilify versus other options.  She was admitted to the hospital for evaluation and stabilization.  Continued Clinical Symptoms:  Alcohol Use Disorder Identification Test Final Score (AUDIT): 0 The "Alcohol Use Disorders Identification Test",  Guidelines for Use in Primary Care, Second Edition.  World Pharmacologist [facility]). Score between 0-7:  no or low risk or alcohol related problems. Score between 8-15:  moderate risk of alcohol related problems. Score between 16-19:  high risk of alcohol related problems. Score 20 or above:  warrants further diagnostic evaluation for alcohol dependence and treatment.   CLINICAL FACTORS:   Bipolar Disorder:   Mixed State   Musculoskeletal: Strength & Muscle Tone: within normal limits Gait & Station: normal Patient leans: N/A  Psychiatric Specialty Exam: Physical Exam  Nursing note and vitals reviewed. Constitutional: She is oriented to person, place, and time. She appears well-developed and well-nourished.  HENT:  Head: Normocephalic and atraumatic.  Respiratory: Effort normal.  Neurological: She is alert and oriented to person, place, and time.    ROS  Blood pressure (!) 144/111, pulse 80, temperature 98.1 F (36.7 C), temperature source Oral, resp. rate 16, height 5\' 2"  (1.575 m), weight 54 kg (119 lb), last menstrual period 10/14/2002, SpO2 100 %.Body mass index is 21.77 kg/m.  General Appearance: Casual  Eye Contact:  Fair  Speech:  Pressured  Volume:  Normal  Mood:  Anxious  Affect:  Congruent  Thought Process:  Coherent  Orientation:  Full (Time, Place, and Person)  Thought Content:  Logical  Suicidal Thoughts:  Yes.  without intent/plan  Homicidal Thoughts:  No  Memory:  Immediate;   Fair  Judgement:  Fair  Insight:  Lacking  Psychomotor Activity:  Increased  Concentration:  Concentration: Fair  Recall:  [employer] of Knowledge:  Fair  Language:  Fair  Akathisia:  Negative  Handed:  Right  AIMS (if  indicated):     Assets:  Communication Skills Desire for Improvement Resilience Social Support  ADL's:  Intact  Cognition:  WNL  Sleep:         COGNITIVE FEATURES THAT CONTRIBUTE TO RISK:  None    SUICIDE RISK:   Minimal: No identifiable suicidal  ideation.  Patients presenting with no risk factors but with morbid ruminations; may be classified as minimal risk based on the severity of the depressive symptoms  PLAN OF CARE: Patient is seen and examined.  Patient is a 67 year old female with the above-stated past psychiatric history who seen on admission because of worsening anxiety, worsening insomnia, benzodiazepine withdrawal.  She will be placed on Librium for taper to get her off the benzodiazepines.  I have convinced her to go back on the Abilify.  We will start at 2 mg p.o. daily.  We will titrate that as necessary.  She is very somatic and concerned about multiple side effects from multiple drugs.  Hopefully we will be able to convince her to stay on this.  We will restart her atenolol 50 mg a day, and she received Tegretol from an emergency room in [facility] to be able to come off of the Ativan, but she has not taken that I do not think it is necessary as long as she is on the benzodiazepines at this point.  She will be encouraged to attend groups.  Should be checked every 15 minutes for withdrawal symptoms as well as suicidal ideation.  I certify that inpatient services furnished can reasonably be expected to improve the patient's condition.   Sharma Covert, MD 03/21/2018, 10:33 AM

## 2018-03-21 NOTE — Progress Notes (Signed)
Pt presents with a flat affect and depressed/anxious mood. Pt rates depression 10/10. Anxiety 10/10. Hopelessness 10/10. Pt denies SI/HI. Pt expressed that she's having withdrawal symptoms from Ativan. Pt verbalized that she was taking Ativan 0.5 mg TID. Pt expressed feeling like she wanted to scream this morning, like she was going to jump out of her skin, restless and pacing. Pt was given Librium for withdrawal symptoms. MD made aware in progression meeting. Pt reports that she was unable to sleep last night due to the withdrawal symptoms. Pt hypertensive this morning. Writer notified MD. Orders reviewed with pt. Verbal support provided. Pt encouraged to attend groups. 15 minute checks performed for safety. Pt compliant with tx plan.

## 2018-03-22 MED ORDER — TRAZODONE HCL 50 MG PO TABS
50.0000 mg | ORAL_TABLET | Freq: Every day | ORAL | Status: DC
  Administered 2018-03-22: 50 mg via ORAL
  Filled 2018-03-22 (×2): qty 1

## 2018-03-22 NOTE — Progress Notes (Signed)
Patient ID: Destiny Elliott, female   DOB: 02-Nov-1951, 67 y.o.   MRN: [id number] Pt presents on the unit with an anxious affect. Pt at assessment continue endorsed moderate anxiety and mild confusion; "there is a lot going on with my medications. I need to get everything straight." Pt verbally contracts for safety. Pt denied SI, HI, pain or AVH. Medications offered as prescribed. All patient's questions and concerns addressed. Support, encouragement, and safe environment provided. Will continue to monitor for any changes. 15-minute safety checks continue. Pt attended wrap-up group. Pt was med compliant.

## 2018-03-22 NOTE — BHH Group Notes (Signed)
[facility] LCSW Group Therapy Note  Date/Time: 03/22/18, 1315  Type of Therapy/Topic:  Group Therapy:  Balance in Life  Participation Level:  active  Description of Group:    This group will address the concept of balance and how it feels and looks when one is unbalanced. Patients will be encouraged to process areas in their lives that are out of balance, and identify reasons for remaining unbalanced. Facilitators will guide patients utilizing problem- solving interventions to address and correct the stressor making their life unbalanced. Understanding and applying boundaries will be explored and addressed for obtaining  and maintaining a balanced life. Patients will be encouraged to explore ways to assertively make their unbalanced needs known to significant others in their lives, using other group members and facilitator for support and feedback.  Therapeutic Goals: 1. Patient will identify two or more emotions or situations they have that consume much of in their lives. 2. Patient will identify signs/triggers that life has become out of balance:  3. Patient will identify two ways to set boundaries in order to achieve balance in their lives:  4. Patient will demonstrate ability to communicate their needs through discussion and/or role plays  Summary of Patient Progress:Pt shared that work, mental/emotional, creativity, and friends are areas that are out of balance in her life.  Pt participated in group discussion regarding ways to recognize and address areas of life that get out of balance.            Therapeutic Modalities:   Cognitive Behavioral Therapy Solution-Focused Therapy Assertiveness Training  Lurline Idol, [place]

## 2018-03-22 NOTE — BHH Suicide Risk Assessment (Signed)
[facility] INPATIENT:  Family/Significant Other Suicide Prevention Education  Suicide Prevention Education:  Education Completed; Edythe Lynn, daughter, [phone], has been identified by the patient as the family member/significant other with whom the patient will be residing, and identified as the person(s) who will aid the patient in the event of a mental health crisis (suicidal ideations/suicide attempt).  With written consent from the patient, the family member/significant other has been provided the following suicide prevention education, prior to the and/or following the discharge of the patient.  The suicide prevention education provided includes the following:  Suicide risk factors  Suicide prevention and interventions  National Suicide Hotline telephone number  [facility] assessment telephone number  [facility] Emergency Assistance [place] and/or Residential Mobile Crisis Unit telephone number  Request made of family/significant other to:  Remove weapons (e.g., guns, rifles, knives), all items previously/currently identified as safety concern.  No guns in the home.  Remove drugs/medications (over-the-counter, prescriptions, illicit drugs), all items previously/currently identified as a safety concern.  The family member/significant other verbalizes understanding of the suicide prevention education information provided.  The family member/significant other agrees to remove the items of safety concern listed above.  Daughter reports the anniversary of pt's son's suicide is always difficult and his birthday is next week, which is also difficult.  Very frustrated with Dr Casimiro Needle and daughter was able to get an appt for June 3 with Dr Darleene Cleaver.  Joanne Chars, LCSW 03/22/2018, 3:11 PM

## 2018-03-22 NOTE — BHH Group Notes (Signed)
[facility] Group Notes:  (Nursing/MHT/Case Management/Adjunct)  Date:  03/22/2018  Time:  5:09 PM  Type of Therapy:  Psychoeducational Skills  Participation Level:  Active  Participation Quality:  Appropriate and Attentive  Affect:  Appropriate  Cognitive:  Alert and Appropriate  Insight:  Appropriate and Good  Engagement in Group:  Engaged  Modes of Intervention:  Discussion and Education  Summary of Progress/Problems: Discussed crisis management.  Patient states she is here for medication adjustment and sleep.  Patient was attentive and receptive.  Destiny Elliott Destiny Elliott 03/22/2018, 5:09 PM

## 2018-03-22 NOTE — Progress Notes (Signed)
[facility] MD Progress Note  03/22/2018 12:19 PM Destiny Elliott  MRN:  [id number] Subjective: Patient is seen and examined.  Patient is a 67 year old female with a past psychiatric history significant for bipolar disorder, severe anxiety and depression.  She is seen in follow-up.  She stated she feels much better today.  She stated her anxiety was rated a 10 yesterday, but is significantly down to about a 4 5.  She did sleep better last night but felt a bit dizzy with the 100 mg trazodone.  We discussed the possibility of decreasing that.  She denied any side effects to the Abilify, and her blood pressure is actually come down from yesterday significantly.  She denied any suicidal ideation today. Principal Problem: <principal problem not specified> Diagnosis:   Patient Active Problem List   Diagnosis Date Noted  . Bipolar disorder ([place]) [F31.9] 03/20/2018  . Bipolar disorder, unspecified ([place]) [F31.9] 09/26/2017   Total Time spent with patient: 20 minutes  Past Psychiatric History: See admission H&P  Past Medical History:  Past Medical History:  Diagnosis Date  . Abnormal Pap smear 1994   --CIN I  . Allergic rhinitis   . Anxiety   . Dense breasts   . Endocervical polyp 01/1993   -benign  . Hypercholesterolemia   . Hypertension   . Osteopenia     Past Surgical History:  Procedure Laterality Date  . CERVICAL BIOPSY  W/ LOOP ELECTRODE EXCISION  03/1993   CIN I  . COLONOSCOPY  2016  . endocervical polyp  01-27-93   -benign  . TOOTH EXTRACTION  08/2017   Family History:  Family History  Problem Relation Age of Onset  . Stroke Father 75  . Kidney disease Father   . Hypertension Father   . Hypertension Sister 78  . Hyperlipidemia Sister   . COPD Sister   . Breast cancer Sister 9  . Thyroid disease Mother 75  . Heart failure Mother   . Heart disease Mother   . Hypertension Mother   . COPD Mother   . Heart disease Brother 42       AFIB  . Suicidality Son 39       07/20/2013  .  Thyroid disease Sister 53   Family Psychiatric  History: See admission H&P Social History:  Social History   Substance and Sexual Activity  Alcohol Use Yes   Comment: occ wine     Social History   Substance and Sexual Activity  Drug Use No    Social History   Socioeconomic History  . Marital status: Married    Spouse name: Sherren Mocha  . Number of children: 2  . Years of education: 7  . Highest education level: Not on file  Occupational History  . Occupation: [employer]    Comment: self employed  Social Needs  . Financial resource strain: Not on file  . Food insecurity:    Worry: Not on file    Inability: Not on file  . Transportation needs:    Medical: Not on file    Non-medical: Not on file  Tobacco Use  . Smoking status: Former Smoker    Packs/day: 0.50    Years: 28.00    Pack years: 14.00    Types: Cigarettes    Last attempt to quit: 07/01/2004    Years since quitting: 13.7  . Smokeless tobacco: Never Used  Substance and Sexual Activity  . Alcohol use: Yes    Comment: occ wine  . Drug  use: No  . Sexual activity: Not Currently    Partners: Male    Birth control/protection: Post-menopausal  Lifestyle  . Physical activity:    Days per week: Not on file    Minutes per session: Not on file  . Stress: Not on file  Relationships  . Social connections:    Talks on phone: Not on file    Gets together: Not on file    Attends religious service: Not on file    Active member of club or organization: Not on file    Attends meetings of clubs or organizations: Not on file    Relationship status: Not on file  Other Topics Concern  . Not on file  Social History Narrative   Lives with Sherren Mocha, one child deceased   Caffeine- 2 cups of tea   Additional Social History:    Pain Medications: see MAR Prescriptions: see MAR Over the Counter: see MAR History of alcohol / drug use?: No history of alcohol / drug abuse Longest period of sobriety (when/how long):  denies Negative Consequences of Use: Legal                    Sleep: Good  Appetite:  Fair  Current Medications: Current Facility-Administered Medications  Medication Dose Route Frequency Provider Last Rate Last Dose  . ARIPiprazole (ABILIFY) tablet 2 mg  2 mg Oral Daily Sharma Covert, MD   2 mg at 03/22/18 0737  . atenolol (TENORMIN) tablet 50 mg  50 mg Oral Daily Sharma Covert, MD   50 mg at 03/22/18 1062  . chlordiazePOXIDE (LIBRIUM) capsule 25 mg  25 mg Oral Q6H PRN Rankin, Shuvon B, NP   25 mg at 03/21/18 0809  . hydrOXYzine (ATARAX/VISTARIL) tablet 25 mg  25 mg Oral Q6H PRN Rankin, Shuvon B, NP   25 mg at 03/21/18 1159  . loperamide (IMODIUM) capsule 2-4 mg  2-4 mg Oral PRN Rankin, Shuvon B, NP      . ondansetron (ZOFRAN-ODT) disintegrating tablet 4 mg  4 mg Oral Q6H PRN Rankin, Shuvon B, NP      . thiamine (VITAMIN B-1) tablet 100 mg  100 mg Oral Daily Rankin, Shuvon B, NP   100 mg at 03/22/18 0813  . traZODone (DESYREL) tablet 50 mg  50 mg Oral QHS Sharma Covert, MD      . triamcinolone (NASACORT) nasal inhaler 1 spray  1 spray Each Nare Daily Sharma Covert, MD        Lab Results:  Results for orders placed or performed during the hospital encounter of 03/20/18 (from the past 48 hour(s))  Urinalysis, Complete w Microscopic     Status: Abnormal   Collection Time: 03/20/18  3:11 PM  Result Value Ref Range   Color, Urine STRAW (A) YELLOW   APPearance CLEAR CLEAR   Specific Gravity, Urine 1.006 1.005 - 1.030   pH 6.0 5.0 - 8.0   Glucose, UA NEGATIVE NEGATIVE mg/dL   Hgb urine dipstick MODERATE (A) NEGATIVE   Bilirubin Urine NEGATIVE NEGATIVE   Ketones, ur NEGATIVE NEGATIVE mg/dL   Protein, ur NEGATIVE NEGATIVE mg/dL   Nitrite NEGATIVE NEGATIVE   Leukocytes, UA NEGATIVE NEGATIVE   RBC / HPF 0-5 0 - 5 RBC/hpf   WBC, UA 0-5 0 - 5 WBC/hpf   Bacteria, UA NONE SEEN NONE SEEN   Squamous Epithelial / LPF 0-5 0 - 5    Comment: Please note change in  reference range.  Mucus PRESENT     Comment: Performed at [facility], [place] [address], [place], [place] [address]  Urine rapid drug screen (hosp performed)not at [facility]     Status: None   Collection Time: 03/20/18  3:11 PM  Result Value Ref Range   Opiates NONE DETECTED NONE DETECTED   Cocaine NONE DETECTED NONE DETECTED   Benzodiazepines NONE DETECTED NONE DETECTED   Amphetamines NONE DETECTED NONE DETECTED   Tetrahydrocannabinol NONE DETECTED NONE DETECTED   Barbiturates NONE DETECTED NONE DETECTED    Comment: (NOTE) DRUG SCREEN FOR MEDICAL PURPOSES ONLY.  IF CONFIRMATION IS NEEDED FOR ANY PURPOSE, NOTIFY LAB WITHIN 5 DAYS. LOWEST DETECTABLE LIMITS FOR URINE DRUG SCREEN Drug Class                     Cutoff (ng/mL) Amphetamine and metabolites    1000 Barbiturate and metabolites    200 Benzodiazepine                 832 Tricyclics and metabolites     300 Opiates and metabolites        300 Cocaine and metabolites        300 THC                            50 Performed at [facility], [place] [address], [place], [place] [address]   Hemoglobin A1c     Status: None   Collection Time: 03/21/18  6:43 AM  Result Value Ref Range   Hgb A1c MFr Bld 5.5 4.8 - 5.6 %    Comment: (NOTE) Pre diabetes:          5.7%-6.4% Diabetes:              >6.4% Glycemic control for   <7.0% adults with diabetes    Mean Plasma Glucose 111.15 mg/dL    Comment: Performed at [facility] [address], [place], [place] [address]  CBC     Status: None   Collection Time: 03/21/18  6:43 AM  Result Value Ref Range   WBC 9.1 4.0 - 10.5 K/uL   RBC 4.80 3.87 - 5.11 MIL/uL   Hemoglobin 14.4 12.0 - 15.0 g/dL   HCT 43.2 36.0 - 46.0 %   MCV 90.0 78.0 - 100.0 fL   MCH 30.0 26.0 - 34.0 pg   MCHC 33.3 30.0 - 36.0 g/dL   RDW 13.9 11.5 - 15.5 %   Platelets 353 150 - 400 K/uL    Comment: Performed at [facility], [place] [address], [place], [place]  [address]  Comprehensive metabolic panel     Status: Abnormal   Collection Time: 03/21/18  6:43 AM  Result Value Ref Range   Sodium 140 135 - 145 mmol/L   Potassium 3.6 3.5 - 5.1 mmol/L   Chloride 101 101 - 111 mmol/L   CO2 25 22 - 32 mmol/L   Glucose, Bld 112 (H) 65 - 99 mg/dL   BUN 12 6 - 20 mg/dL   Creatinine, Ser 0.60 0.44 - 1.00 mg/dL   Calcium 9.5 8.9 - 10.3 mg/dL   Total Protein 8.2 (H) 6.5 - 8.1 g/dL   Albumin 4.5 3.5 - 5.0 g/dL   AST 27 15 - 41 U/L   ALT 18 14 - 54 U/L   Alkaline Phosphatase 74 38 - 126 U/L   Total Bilirubin 1.0 0.3 -  1.2 mg/dL   GFR calc non Af Amer >60 >60 mL/min   GFR calc Af Amer >60 >60 mL/min    Comment: (NOTE) The eGFR has been calculated using the CKD EPI equation. This calculation has not been validated in all clinical situations. eGFR's persistently <60 mL/min signify possible Chronic Kidney Disease.    Anion gap 14 5 - 15    Comment: Performed at [facility], [place] [address] [place], [place] [address]  Ethanol     Status: None   Collection Time: 03/21/18  6:43 AM  Result Value Ref Range   Alcohol, Ethyl (B) <10 <10 mg/dL    Comment:        LOWEST DETECTABLE LIMIT FOR SERUM ALCOHOL IS 10 mg/dL FOR MEDICAL PURPOSES ONLY Performed at [facility] [address], [place], [place] [address]   Lipid panel     Status: None   Collection Time: 03/21/18  6:43 AM  Result Value Ref Range   Cholesterol 163 0 - 200 mg/dL   Triglycerides 77 <150 mg/dL   HDL 49 >40 mg/dL   Total CHOL/HDL Ratio 3.3 RATIO   VLDL 15 0 - 40 mg/dL   LDL Cholesterol 99 0 - 99 mg/dL    Comment:        Total Cholesterol/HDL:CHD Risk Coronary Heart Disease Risk Table                     Men   Women  1/2 Average Risk   3.4   3.3  Average Risk       5.0   4.4  2 X Average Risk   9.6   7.1  3 X Average Risk  23.4   11.0        Use the calculated Patient Ratio above and the CHD Risk Table to determine the patient's CHD Risk.         ATP III CLASSIFICATION (LDL):  <100     mg/dL   Optimal  100-129  mg/dL   Near or Above                    Optimal  130-159  mg/dL   Borderline  160-189  mg/dL   High  >190     mg/dL   Very High Performed at [facility] [address] [place], [place] [address]   TSH     Status: None   Collection Time: 03/21/18  6:43 AM  Result Value Ref Range   TSH 2.129 0.350 - 4.500 uIU/mL    Comment: Performed by a 3rd Generation assay with a functional sensitivity of <=0.01 uIU/mL. Performed at [facility], [place] [address] [place], [place] [address]     Blood Alcohol level:  Lab Results  Component Value Date   ETH <10 03/21/2018   ETH <10 29/56/2130    Metabolic Disorder Labs: Lab Results  Component Value Date   HGBA1C 5.5 03/21/2018   MPG 111.15 03/21/2018   No results found for: PROLACTIN Lab Results  Component Value Date   CHOL 163 03/21/2018   TRIG 77 03/21/2018   HDL 49 03/21/2018   CHOLHDL 3.3 03/21/2018   VLDL 15 03/21/2018   LDLCALC 99 03/21/2018    Physical Findings: AIMS: Facial and Oral Movements Muscles of Facial Expression: None, normal Lips and Perioral Area: None, normal Jaw: None, normal Tongue: None, normal,Extremity Movements Upper (arms, wrists, hands, fingers): None, normal Lower (  legs, knees, ankles, toes): None, normal, Trunk Movements Neck, shoulders, hips: None, normal, Overall Severity Severity of abnormal movements (highest score from questions above): None, normal Incapacitation due to abnormal movements: None, normal Patient's awareness of abnormal movements (rate only patient's report): No Awareness, Dental Status Current problems with teeth and/or dentures?: No Does patient usually wear dentures?: No  CIWA:  CIWA-Ar Total: 3 COWS:  COWS Total Score: 0  Musculoskeletal: Strength & Muscle Tone: within normal limits Gait & Station: normal Patient leans: N/A  Psychiatric Specialty Exam: Physical  Exam  Nursing note and vitals reviewed. Constitutional: She is oriented to person, place, and time. She appears well-developed and well-nourished.  HENT:  Head: Normocephalic and atraumatic.  Respiratory: Effort normal.  Neurological: She is alert and oriented to person, place, and time.    ROS  Blood pressure 109/84, pulse 86, temperature 99.9 F (37.7 C), temperature source Oral, resp. rate 16, height 5' 2"  (1.575 m), weight 54 kg (119 lb), last menstrual period 10/14/2002, SpO2 100 %.Body mass index is 21.77 kg/m.  General Appearance: Casual  Eye Contact:  Good  Speech:  Normal Rate  Volume:  Normal  Mood:  Anxious  Affect:  Appropriate  Thought Process:  Coherent  Orientation:  Full (Time, Place, and Person)  Thought Content:  Logical  Suicidal Thoughts:  No  Homicidal Thoughts:  No  Memory:  Immediate;   Fair  Judgement:  Intact  Insight:  Fair  Psychomotor Activity:  Normal  Concentration:  Concentration: Fair  Recall:  [employer] of Knowledge:  Fair  Language:  Good  Akathisia:  No  Handed:  Right  AIMS (if indicated):     Assets:  Desire for Improvement Housing Physical Health Resilience Social Support  ADL's:  Intact  Cognition:  WNL  Sleep:        Treatment Plan Summary: Daily contact with patient to assess and evaluate symptoms and progress in treatment, Medication management and Plan Patient is seen and examined.  Patient is a 67 year old female with the above-stated past psychiatric history is seen in follow-up.  She is doing better today.  She slept a little bit better but had some excessive dizziness to the trazodone.  We will reduce that dose down to 50 mg a day.  We will continue the Abilify at 2 mg p.o. nightly, and as well continue the Librium for now.  She is not showing any evidence of withdrawal at this point.  We will continue her detox for Ativan.  She denied any suicidal ideation today, and if things go according to plan I anticipate significant  improvement  Sharma Covert, MD 03/22/2018, 12:19 PM

## 2018-03-22 NOTE — Progress Notes (Signed)
Pt observed in dayroom, attending wrap-up group. Pt presents with anxious affect and mood. Pt denies SI/HI/AVH/Pain at this time. Pt appears easily forgetful. Pt was hesitant about taking meds for sleep. Pt c/o of ongoing insomnia and anxiety. PRN vistaril and trazodone offered and Pt accepted. Continue with POC.

## 2018-03-22 NOTE — Plan of Care (Signed)
  Problem: Education: Goal: Emotional status will improve Outcome: Progressing   Problem: Activity: Goal: Interest or engagement in activities will improve Outcome: Progressing   Problem: Safety: Goal: Periods of time without injury will increase Outcome: Progressing   Problem: Medication: Goal: Compliance with prescribed medication regimen will improve Outcome: Progressing  DAR NOTE: Patient reports less anxiety and mood is appropriate to situation.  Denies suicidal thoughts, auditory and visual hallucinations.  States trazodone made her dizzy and drowsy this morning. Described energy level as normal and concentration as good.  Rates depression at 0, hopelessness at 0, and anxiety at 0.  Maintained on routine safety checks.  Medications given as prescribed.  Support and encouragement offered as needed.  Attended group and participated.  States goal for today is "work on positive thoughts. " Patient observed socializing with peers in the dayroom.

## 2018-03-22 NOTE — Progress Notes (Signed)
Adult Psychoeducational Group Note  Date:  03/22/2018 Time:  2:04 PM  Group Topic/Focus:  Goals Group:   The focus of this group is to help patients establish daily goals to achieve during treatment and discuss how the patient can incorporate goal setting into their daily lives to aide in recovery.  Participation Level:  Active  Participation Quality:  Appropriate  Affect:  Appropriate  Cognitive:  Alert  Insight: Good  Engagement in Group:  Engaged  Modes of Intervention:  Discussion  Additional Comments:   Pt attended group and participated in group discussion. Zaidan Keeble R [place]   03/22/2018, 2:04 PM

## 2018-03-23 MED ORDER — CHLORDIAZEPOXIDE HCL 25 MG PO CAPS: 25.0000 mg | ORAL_CAPSULE | Freq: Three times a day (TID) | ORAL | Status: DC | PRN

## 2018-03-23 MED ORDER — ARIPIPRAZOLE 5 MG PO TABS
5.0000 mg | ORAL_TABLET | Freq: Every day | ORAL | Status: DC
  Administered 2018-03-23 – 2018-03-24 (×2): 5 mg via ORAL
  Filled 2018-03-23: qty 1
  Filled 2018-03-23: qty 7
  Filled 2018-03-23 (×3): qty 1
  Filled 2018-03-23: qty 7

## 2018-03-23 MED ORDER — TRAZODONE HCL 150 MG PO TABS
75.0000 mg | ORAL_TABLET | Freq: Every day | ORAL | Status: DC
  Administered 2018-03-23: 75 mg via ORAL
  Filled 2018-03-23: qty 4
  Filled 2018-03-23 (×2): qty 0.5
  Filled 2018-03-23: qty 4
  Filled 2018-03-23: qty 1

## 2018-03-23 NOTE — Progress Notes (Signed)
D patient is seen [facility] UAL out in the milieu...she is flat, dperessed , says " don't you think I can get discharged before Mother's Day?". Pt is unwilling to be practical ( and admit that she still needs to be here).     A Pt completes her daily assessment and on it she wrote  She deneid SI today and she rated her depression, hopelessness , and anxiety " 3/0/3", respectively. She talks about the importance of tomorrow-the anniversary of her son's suicide.     R Safety in place.

## 2018-03-23 NOTE — BHH Group Notes (Signed)
[facility] LCSW Group Therapy Note  Date/Time: 03/23/18, 1315  Type of Therapy/Topic:  Group Therapy:  Feelings about Diagnosis  Participation Level:  Active   Mood:pleasant   Description of Group:    This group will allow patients to explore their thoughts and feelings about diagnoses they have received. Patients will be guided to explore their level of understanding and acceptance of these diagnoses. Facilitator will encourage patients to process their thoughts and feelings about the reactions of others to their diagnosis, and will guide patients in identifying ways to discuss their diagnosis with significant others in their lives. This group will be process-oriented, with patients participating in exploration of their own experiences as well as giving and receiving support and challenge from other group members.   Therapeutic Goals: 1. Patient will demonstrate understanding of diagnosis as evidence by identifying two or more symptoms of the disorder:  2. Patient will be able to express two feelings regarding the diagnosis 3. Patient will demonstrate ability to communicate their needs through discussion and/or role plays  Summary of Patient Progress:Pt was active during group discussion and made a number of contributions.  Pt was aware of her diagnosis and symptoms.  Good participation.        Therapeutic Modalities:   Cognitive Behavioral Therapy Brief Therapy Feelings Identification   Lurline Idol, LCSW

## 2018-03-23 NOTE — Progress Notes (Signed)
Recreation Therapy Notes  Date: 5.10.19 Time: 0930 Location: [address] Dayroom  Group Topic: Stress Management  Goal Area(s) Addresses:  Patient will verbalize importance of using healthy stress management.  Patient will identify positive emotions associated with healthy stress management.   Behavioral Response: Engaged  Intervention: Stress Management  Activity : Body Scan Meditation.  LRT played meditation that allowed patients to take note of any sensations and feelings they may have been experiencing throughout their bodies.  Patients were to follow along as meditation played.  Education:  Stress Management, Discharge Planning.   Education Outcome: Acknowledges edcuation/In group clarification offered/Needs additional education  Clinical Observations/Feedback: Pt attended group.    Victorino Sparrow, LRT/CTRS         Victorino Sparrow A 03/23/2018 12:36 PM

## 2018-03-23 NOTE — Plan of Care (Signed)
  Problem: Education: Goal: Emotional status will improve Outcome: Not Progressing   

## 2018-03-23 NOTE — Progress Notes (Signed)
During p.m. environmental checks, 2 pens were found in Pt's word search book. Pens removed, charge nurse and patient notified. Pens were left in Pt's chart. To be given back once d/c.

## 2018-03-23 NOTE — Progress Notes (Signed)
[facility] MD Progress Note  03/23/2018 10:15 AM Destiny Elliott  MRN:  [id number] Subjective: Patient is seen and examined.  Patient is a 67 year old female with a past psychiatric history significant for reported bipolar disorder, anxiety, depression and I think some component of obsessive/compulsive disorder.  She is seen in follow-up.  She stated that she is essentially same as she had been yesterday.  She did not sleep as well.  Her anxiety is still high.  We discussed increasing her Abilify to 5 mg.  On her previous psychiatric hospitalization she had been discharged on 5 mg.  She continues to be very careful of any medications, and had to be reassured multiple times that she had been on 5 mg before, and that both she and her daughter felt as though she did better at this dosage.  Very somatically focused. Principal Problem: <principal problem not specified> Diagnosis:   Patient Active Problem List   Diagnosis Date Noted  . Bipolar disorder ([place]) [F31.9] 03/20/2018  . Bipolar disorder, unspecified ([place]) [F31.9] 09/26/2017   Total Time spent with patient: 20 minutes  Past Psychiatric History: See admission H&P  Past Medical History:  Past Medical History:  Diagnosis Date  . Abnormal Pap smear 1994   --CIN I  . Allergic rhinitis   . Anxiety   . Dense breasts   . Endocervical polyp 01/1993   -benign  . Hypercholesterolemia   . Hypertension   . Osteopenia     Past Surgical History:  Procedure Laterality Date  . CERVICAL BIOPSY  W/ LOOP ELECTRODE EXCISION  03/1993   CIN I  . COLONOSCOPY  2016  . endocervical polyp  01-27-93   -benign  . TOOTH EXTRACTION  08/2017   Family History:  Family History  Problem Relation Age of Onset  . Stroke Father 10  . Kidney disease Father   . Hypertension Father   . Hypertension Sister 1  . Hyperlipidemia Sister   . COPD Sister   . Breast cancer Sister 43  . Thyroid disease Mother 58  . Heart failure Mother   . Heart disease Mother   .  Hypertension Mother   . COPD Mother   . Heart disease Brother 61       AFIB  . Suicidality Son 39       07/20/2013  . Thyroid disease Sister 54   Family Psychiatric  History: See admission H&P Social History:  Social History   Substance and Sexual Activity  Alcohol Use Yes   Comment: occ wine     Social History   Substance and Sexual Activity  Drug Use No    Social History   Socioeconomic History  . Marital status: Married    Spouse name: Destiny Elliott  . Number of children: 2  . Years of education: 47  . Highest education level: Not on file  Occupational History  . Occupation: [employer]    Comment: self employed  Social Needs  . Financial resource strain: Not on file  . Food insecurity:    Worry: Not on file    Inability: Not on file  . Transportation needs:    Medical: Not on file    Non-medical: Not on file  Tobacco Use  . Smoking status: Former Smoker    Packs/day: 0.50    Years: 28.00    Pack years: 14.00    Types: Cigarettes    Last attempt to quit: 07/01/2004    Years since quitting: 13.7  . Smokeless tobacco:  Never Used  Substance and Sexual Activity  . Alcohol use: Yes    Comment: occ wine  . Drug use: No  . Sexual activity: Not Currently    Partners: Male    Birth control/protection: Post-menopausal  Lifestyle  . Physical activity:    Days per week: Not on file    Minutes per session: Not on file  . Stress: Not on file  Relationships  . Social connections:    Talks on phone: Not on file    Gets together: Not on file    Attends religious service: Not on file    Active member of club or organization: Not on file    Attends meetings of clubs or organizations: Not on file    Relationship status: Not on file  Other Topics Concern  . Not on file  Social History Narrative   Lives with Destiny Elliott, one child deceased   Caffeine- 2 cups of tea   Additional Social History:    Pain Medications: see MAR Prescriptions: see MAR Over the Counter: see  MAR History of alcohol / drug use?: No history of alcohol / drug abuse Longest period of sobriety (when/how long): denies Negative Consequences of Use: Legal                    Sleep: Fair  Appetite:  Fair  Current Medications: Current Facility-Administered Medications  Medication Dose Route Frequency Provider Last Rate Last Dose  . ARIPiprazole (ABILIFY) tablet 5 mg  5 mg Oral Daily Sharma Covert, MD      . atenolol (TENORMIN) tablet 50 mg  50 mg Oral Daily Sharma Covert, MD   50 mg at 03/23/18 [phone]  . chlordiazePOXIDE (LIBRIUM) capsule 25 mg  25 mg Oral Q8H PRN Sharma Covert, MD      . hydrOXYzine (ATARAX/VISTARIL) tablet 25 mg  25 mg Oral Q6H PRN Rankin, Shuvon B, NP   25 mg at 03/22/18 2233  . loperamide (IMODIUM) capsule 2-4 mg  2-4 mg Oral PRN Rankin, Shuvon B, NP      . ondansetron (ZOFRAN-ODT) disintegrating tablet 4 mg  4 mg Oral Q6H PRN Rankin, Shuvon B, NP      . thiamine (VITAMIN B-1) tablet 100 mg  100 mg Oral Daily Rankin, Shuvon B, NP   100 mg at 03/23/18 0837  . traZODone (DESYREL) tablet 50 mg  50 mg Oral QHS Sharma Covert, MD   50 mg at 03/22/18 2233  . triamcinolone (NASACORT) nasal inhaler 1 spray  1 spray Each Nare Daily Sharma Covert, MD   Stopped at 03/22/18 1504    Lab Results: No results found for this or any previous visit (from the past 48 hour(s)).  Blood Alcohol level:  Lab Results  Component Value Date   ETH <10 03/21/2018   ETH <10 23/55/7322    Metabolic Disorder Labs: Lab Results  Component Value Date   HGBA1C 5.5 03/21/2018   MPG 111.15 03/21/2018   No results found for: PROLACTIN Lab Results  Component Value Date   CHOL 163 03/21/2018   TRIG 77 03/21/2018   HDL 49 03/21/2018   CHOLHDL 3.3 03/21/2018   VLDL 15 03/21/2018   LDLCALC 99 03/21/2018    Physical Findings: AIMS: Facial and Oral Movements Muscles of Facial Expression: None, normal Lips and Perioral Area: None, normal Jaw: None,  normal Tongue: None, normal,Extremity Movements Upper (arms, wrists, hands, fingers): None, normal Lower (legs, knees, ankles, toes): None, normal, Trunk  Movements Neck, shoulders, hips: None, normal, Overall Severity Severity of abnormal movements (highest score from questions above): None, normal Incapacitation due to abnormal movements: None, normal Patient's awareness of abnormal movements (rate only patient's report): No Awareness, Dental Status Current problems with teeth and/or dentures?: No Does patient usually wear dentures?: No  CIWA:  CIWA-Ar Total: 1 COWS:  COWS Total Score: 0  Musculoskeletal: Strength & Muscle Tone: within normal limits Gait & Station: normal Patient leans: N/A  Psychiatric Specialty Exam: Physical Exam  Nursing note and vitals reviewed. Constitutional: She is oriented to person, place, and time. She appears well-developed and well-nourished.  HENT:  Head: Normocephalic and atraumatic.  Respiratory: Effort normal.  Neurological: She is oriented to person, place, and time.    ROS  Blood pressure 104/76, pulse 63, temperature 99.9 F (37.7 C), temperature source Oral, resp. rate 16, height 5\' 2"  (1.575 m), weight 54 kg (119 lb), last menstrual period 10/14/2002, SpO2 100 %.Body mass index is 21.77 kg/m.  General Appearance: Casual  Eye Contact:  Fair  Speech:  Normal Rate  Volume:  Normal  Mood:  Anxious  Affect:  Congruent  Thought Process:  Coherent  Orientation:  Full (Time, Place, and Person)  Thought Content:  Logical  Suicidal Thoughts:  No  Homicidal Thoughts:  No  Memory:  Immediate;   Fair  Judgement:  Intact  Insight:  Lacking  Psychomotor Activity:  Increased  Concentration:  Concentration: Fair  Recall:  [place] of Knowledge:  Good  Language:  Good  Akathisia:  No  Handed:  Right  AIMS (if indicated):     Assets:  Desire for Improvement Financial Resources/Insurance Housing Physical Health Social Support  ADL's:   Intact  Cognition:  WNL  Sleep:  Number of Hours: 6.75     Treatment Plan Summary: Daily contact with patient to assess and evaluate symptoms and progress in treatment, Medication management and Plan Patient is seen and examined.  Patient is a 67 year old female with the above-stated past psychiatric history seen in follow-up.  I am going to increase her Abilify to 5 mg p.o. daily.  We will continue the trazodone.  I am to make that 75 mg.  She had previously had some dizziness and other side effects to the 100 mg, I am hoping this 75 will help her rest a bit more comfortably.  She is Artie had her daughter call me today, fearful of the medication changes.  We had a long discussion and the daughter completely understands where we are at.  Hopefully her anxiety will decrease in her rumination and obsessiveness will decrease to a certain degree with the increased dose of the Abilify.  Sharma Covert, MD 03/23/2018, 10:15 AM

## 2018-03-23 NOTE — Progress Notes (Signed)
  Pt observed in dayroom, attending wrap-up group. Pt presents with anxious affect and mood. Pt denies SI/HI/AVH/Pain at this time. Pt brightens on approach this evening. Pt states she felt satisfied with new sleep regimen tonight.Continue with POC.

## 2018-03-24 DIAGNOSIS — Z87891 Personal history of nicotine dependence: Secondary | ICD-10-CM

## 2018-03-24 DIAGNOSIS — F319 Bipolar disorder, unspecified: Principal | ICD-10-CM

## 2018-03-24 DIAGNOSIS — Z818 Family history of other mental and behavioral disorders: Secondary | ICD-10-CM

## 2018-03-24 DIAGNOSIS — G4700 Insomnia, unspecified: Secondary | ICD-10-CM

## 2018-03-24 MED ORDER — TRAZODONE HCL 150 MG PO TABS: 75.0000 mg | ORAL_TABLET | Freq: Every day | ORAL | 0 refills | Status: DC

## 2018-03-24 MED ORDER — ARIPIPRAZOLE 5 MG PO TABS: 5.0000 mg | ORAL_TABLET | Freq: Every day | ORAL | 0 refills | Status: DC

## 2018-03-24 NOTE — BHH Suicide Risk Assessment (Signed)
[facility] Discharge Suicide Risk Assessment   Principal Problem:  Anxiety,Insomnia  Discharge Diagnoses:  Patient Active Problem List   Diagnosis Date Noted  . Bipolar disorder ([place]) [F31.9] 03/20/2018  . Bipolar disorder, unspecified ([place]) [F31.9] 09/26/2017    Total Time spent with patient: 30 minutes  Musculoskeletal: Strength & Muscle Tone: within normal limits Gait & Station: normal Patient leans: N/A  Psychiatric Specialty Exam: ROS Denies headache, no chest pain, no shortness of breath, no vomiting or diarrhea, no chills, no rash   Blood pressure 132/78, pulse 61, temperature 99.2 F (37.3 C), temperature source Oral, resp. rate 18, height 5\' 2"  (1.575 m), weight 54 kg (119 lb), last menstrual period 10/14/2002, SpO2 100 %.Body mass index is 21.77 kg/m.  General Appearance: Well Groomed  Eye Contact::  Good  Speech:  Normal Rate409  Volume:  Normal  Mood:  reports her mood is " good today", states " I am in a great mood today"  Affect:  Appropriate and Full Range  Thought Process:  Linear and Descriptions of Associations: Intact  Orientation:  Full (Time, Place, and Person)  Thought Content:  no hallucinations, no delusions, not internally preoccupied   Suicidal Thoughts:  No denies any suicidal or self injurious ideations, denies any homicidal or violent ideations  Homicidal Thoughts:  No  Memory:  recent and remote grossly intact   Judgement:  Other:  improved   Insight:  improved   Psychomotor Activity:  Normal  Concentration:  Good  Recall:  Good  Fund of Knowledge:Good  Language: Good  Akathisia:  Negative  Handed:  Right  AIMS (if indicated):     Assets:  Communication Skills Desire for Improvement Social Support  Sleep:  Number of Hours: 6.75  Cognition: WNL  ADL's:  Intact   Mental Status Per Nursing Assessment::   On Admission:     Demographic Factors:  67 year old married female , has one adult daughter, self employed   Loss Factors: States she  had recently tapered off BZDs, son passed away 5 years ago  Historical Factors: One psychiatric admission , in the past has been diagnosed with Anxiety/ GAD , Bipolar Disorder   Risk Reduction Factors:   Sense of responsibility to family, Employed, Living with another person, especially a relative and Positive coping skills or problem solving skills  Continued Clinical Symptoms:  Alert, attentive, well related , pleasant, mood improved and presents euthymic, affect appropriate and reactive, no thought disorder, no suicidal or self injurious ideations, no homicidal or violent ideations, no psychotic symptoms, future oriented. Currently denies medication side effects. Behavior on unit in good control, pleasant on approach. With patient's express consent I spoke with her husband who has been visiting her regularly on unit, he corroborates that patient seems much improved, at baseline, and is in agreement with discharge planning today .   Cognitive Features That Contribute To Risk:  No gross cognitive deficits noted upon discharge. Is alert , attentive, and oriented x 3   Suicide Risk:  Mild:  Suicidal ideation of limited frequency, intensity, duration, and specificity.  There are no identifiable plans, no associated intent, mild dysphoria and related symptoms, good self-control (both objective and subjective assessment), few other risk factors, and identifiable protective factors, including available and accessible social support.  Follow-up Information    [facility]. Go on 03/27/2018.   Why:  Please attend your therapy appt on Tuesday, 03/27/18, at 12noon. Contact information: [address],  [place], [place] [address] P: [phone])  [phone] F: [place]. Go on 04/04/2018.   Why:  Please attend your medication appt with Stephannie Peters on Wednesday, 04/04/18, at 1:15pm.  If you prefer to meet with Dr Darleene Cleaver for future appts please let them know. Contact  information: [place] [facility] [address] [phone]           Plan Of Care/Follow-up recommendations:  Activity:  as tolerated  Diet:  heart healthy Tests:  NA Other:  See below  Patient is leaving unit in good spirits  She is returning home, states her husband will pick her up later today Plans to follow up as above . She also has a PCP , Dr. Cheron Schaumann, for medical issues as needed .  Jenne Campus, MD 03/24/2018, 8:31 AM

## 2018-03-24 NOTE — Discharge Summary (Addendum)
Physician Discharge Summary Note  Patient:  Destiny Elliott is an 67 y.o., female MRN:  [id number] DOB:  December 10, 1950 Patient phone:  [phone] (home)  Patient address:   [address] [place] [address],  Total Time spent with patient: 20 minutes  Date of Admission:  03/20/2018 Date of Discharge: 03/24/18  Reason for Admission:  Worsening depression with SI  Principal Problem: Bipolar disorder [facility]) Discharge Diagnoses: Patient Active Problem List   Diagnosis Date Noted  . Bipolar disorder ([place]) [F31.9] 03/20/2018  . Bipolar disorder, unspecified ([place]) [F31.9] 09/26/2017    Past Psychiatric History: Patient has one previous admission in November 2018.  She was diagnosed with bipolar disorder.  She was discharged on Abilify and trazodone at that time.  She is followed by Dr. Casimiro Needle as an outpatient.  She is been on multiple SSRIs and other antianxiety medicines, and is been unable to tolerate any of those  Past Medical History:  Past Medical History:  Diagnosis Date  . Abnormal Pap smear 1994   --CIN I  . Allergic rhinitis   . Anxiety   . Dense breasts   . Endocervical polyp 01/1993   -benign  . Hypercholesterolemia   . Hypertension   . Osteopenia     Past Surgical History:  Procedure Laterality Date  . CERVICAL BIOPSY  W/ LOOP ELECTRODE EXCISION  03/1993   CIN I  . COLONOSCOPY  2016  . endocervical polyp  01-27-93   -benign  . TOOTH EXTRACTION  08/2017   Family History:  Family History  Problem Relation Age of Onset  . Stroke Father 19  . Kidney disease Father   . Hypertension Father   . Hypertension Sister 36  . Hyperlipidemia Sister   . COPD Sister   . Breast cancer Sister 88  . Thyroid disease Mother 4  . Heart failure Mother   . Heart disease Mother   . Hypertension Mother   . COPD Mother   . Heart disease Brother 19       AFIB  . Suicidality Son 39       07/20/2013  . Thyroid disease Sister 3   Family Psychiatric  History: Son has  bipolar disorder committed suicide.  Sisters are on medications for mental health but unsure diagnosis   Social History:  Social History   Substance and Sexual Activity  Alcohol Use Yes   Comment: occ wine     Social History   Substance and Sexual Activity  Drug Use No    Social History   Socioeconomic History  . Marital status: Married    Spouse name: Sherren Mocha  . Number of children: 2  . Years of education: 73  . Highest education level: Not on file  Occupational History  . Occupation: [employer]    Comment: self employed  Social Needs  . Financial resource strain: Not on file  . Food insecurity:    Worry: Not on file    Inability: Not on file  . Transportation needs:    Medical: Not on file    Non-medical: Not on file  Tobacco Use  . Smoking status: Former Smoker    Packs/day: 0.50    Years: 28.00    Pack years: 14.00    Types: Cigarettes    Last attempt to quit: 07/01/2004    Years since quitting: 13.7  . Smokeless tobacco: Never Used  Substance and Sexual Activity  . Alcohol use: Yes    Comment: occ wine  .  Drug use: No  . Sexual activity: Not Currently    Partners: Male    Birth control/protection: Post-menopausal  Lifestyle  . Physical activity:    Days per week: Not on file    Minutes per session: Not on file  . Stress: Not on file  Relationships  . Social connections:    Talks on phone: Not on file    Gets together: Not on file    Attends religious service: Not on file    Active member of club or organization: Not on file    Attends meetings of clubs or organizations: Not on file    Relationship status: Not on file  Other Topics Concern  . Not on file  Social History Narrative   Lives with Sherren Mocha, one child deceased   Caffeine- 2 cups of tea    Hospital Course:   03/21/18 [facility] MD Assessment: Patient is seen and examined.  Patient is a 67 year old female with a reported past psychiatric history significant for bipolar disorder who  presented to the behavioral health hospital with suicidal ideation.  She is being followed by an outpatient psychiatrist, and back in November recommended coming off of Abilify.  She is unsure exactly why he wanted her off of it.  She started having worsening symptoms with regard to insomnia, anxiety etc. in February.  She was placed on Ativan.  She suffered side effects from this.  Attempts to decrease the dosage and adjust the dosage were of no benefit.  She has a list of medications which she is unable to take due to side effects.  We discussed options today to restart the Abilify.  She is conflicted over this because the fact the physician told her that "it is not good to be on long-term".  We discussed options and the patient finally agreed to go back on the Abilify.  She had been hospitalized here in 2018 and was placed on it.  At discharge dosage was only 5 mg at that time.  She was admitted to the hospital for evaluation and stabilization.  Patient remained on the [facility] unit for 3 days. The patient stabilized on medication and therapy. Patient was discharged on Abilify 5 mg Daily and Trazodone 75 mg QHS. Patient has shown improvement with improved mood, affect, sleep, appetite, and interaction. Patient has attended group and participated. Patient has been seen in the day room interacting with peers and staff appropriately. Patient denies any SI/HI/AVH and contracts for safety. Patient agrees to follow up at [facility]. Patient is provided with prescriptions for their medications upon discharge.   Physical Findings: AIMS: Facial and Oral Movements Muscles of Facial Expression: None, normal Lips and Perioral Area: None, normal Jaw: None, normal Tongue: None, normal,Extremity Movements Upper (arms, wrists, hands, fingers): None, normal Lower (legs, knees, ankles, toes): None, normal, Trunk Movements Neck, shoulders, hips: None, normal, Overall Severity Severity of abnormal  movements (highest score from questions above): None, normal Incapacitation due to abnormal movements: None, normal Patient's awareness of abnormal movements (rate only patient's report): No Awareness, Dental Status Current problems with teeth and/or dentures?: No Does patient usually wear dentures?: No  CIWA:  CIWA-Ar Total: 0 COWS:  COWS Total Score: 0  Musculoskeletal: Strength & Muscle Tone: within normal limits Gait & Station: normal Patient leans: N/A  Psychiatric Specialty Exam: Physical Exam  Nursing note and vitals reviewed. Constitutional: She is oriented to person, place, and time. She appears well-developed and well-nourished.  Cardiovascular: Normal rate.  Respiratory: Effort  normal.  Musculoskeletal: Normal range of motion.  Neurological: She is alert and oriented to person, place, and time.  Skin: Skin is warm.    Review of Systems  Constitutional: Negative.   HENT: Negative.   Eyes: Negative.   Respiratory: Negative.   Cardiovascular: Negative.   Gastrointestinal: Negative.   Genitourinary: Negative.   Musculoskeletal: Negative.   Skin: Negative.   Neurological: Negative.   Endo/Heme/Allergies: Negative.   Psychiatric/Behavioral: Negative.     Blood pressure 132/78, pulse 61, temperature 99.2 F (37.3 C), temperature source Oral, resp. rate 18, height 5\' 2"  (1.575 m), weight 54 kg (119 lb), last menstrual period 10/14/2002, SpO2 100 %.Body mass index is 21.77 kg/m.  General Appearance: Casual  Eye Contact:  Good  Speech:  Clear and Coherent and Normal Rate  Volume:  Normal  Mood:  Euthymic  Affect:  Congruent  Thought Process:  Goal Directed and Descriptions of Associations: Intact  Orientation:  Full (Time, Place, and Person)  Thought Content:  WDL  Suicidal Thoughts:  No  Homicidal Thoughts:  No  Memory:  Immediate;   Good Recent;   Good Remote;   Good  Judgement:  Fair  Insight:  Good  Psychomotor Activity:  Normal  Concentration:   Concentration: Good and Attention Span: Good  Recall:  Good  Fund of Knowledge:  Good  Language:  Good  Akathisia:  No  Handed:  Right  AIMS (if indicated):     Assets:  Communication Skills Desire for Improvement Financial Resources/Insurance Housing Physical Health Social Support Transportation  ADL's:  Intact  Cognition:  WNL  Sleep:  Number of Hours: 6.75     Have you used any form of tobacco in the last 30 days? (Cigarettes, Smokeless Tobacco, Cigars, and/or Pipes): No  Has this patient used any form of tobacco in the last 30 days? (Cigarettes, Smokeless Tobacco, Cigars, and/or Pipes) Yes, No  Blood Alcohol level:  Lab Results  Component Value Date   ETH <10 03/21/2018   ETH <10 24/23/5361    Metabolic Disorder Labs:  Lab Results  Component Value Date   HGBA1C 5.5 03/21/2018   MPG 111.15 03/21/2018   No results found for: PROLACTIN Lab Results  Component Value Date   CHOL 163 03/21/2018   TRIG 77 03/21/2018   HDL 49 03/21/2018   CHOLHDL 3.3 03/21/2018   VLDL 15 03/21/2018   [place] 99 03/21/2018    See Psychiatric Specialty Exam and Suicide Risk Assessment completed by Attending Physician prior to discharge.  Discharge destination:  Home  Is patient on multiple antipsychotic therapies at discharge:  No   Has Patient had three or more failed trials of antipsychotic monotherapy by history:  No  Recommended Plan for Multiple Antipsychotic Therapies: NA  Discharge Instructions    Diet - low sodium heart healthy   Complete by:  As directed    Discharge instructions   Complete by:  As directed    Take all medications as prescribed. Keep all follow-up appointments as scheduled.  Do not consume alcohol or use illegal drugs while on prescription medications. Report any adverse effects from your medications to your primary care provider promptly.  In the event of recurrent symptoms or worsening symptoms, call 911, a crisis hotline, or go to the nearest  emergency department for evaluation.   Increase activity slowly   Complete by:  As directed      Allergies as of 03/24/2018      Reactions   Ambien [zolpidem Tartrate]  Other (See Comments)   Causes depression   Benadryl [diphenhydramine Hcl]    Causes nasal dryness   Buspar [buspirone] Other (See Comments)   "felt like face was drawing down"   Flonase [fluticasone Propionate] Other (See Comments)   Patient couldn't recall what  her reaction was   Gabapentin Other (See Comments)   Raised blood pressure   Nickel Itching   Nortriptyline Other (See Comments)   Causes dry sinuses,dry mouth   Paxil [paroxetine Hcl] Other (See Comments)   agitation   Remeron [mirtazapine] Other (See Comments)   Agitation and depression   Tylenol Pm Extra [diphenhydramine-apap (sleep)] Other (See Comments)   Causes nasal dryness   Vitamin D Analogs    doesn't take Vit D supplements because they make her feel bad    Zoloft [sertraline Hcl] Other (See Comments)   agitation      Medication List    STOP taking these medications   carbamazepine 100 MG chewable tablet Commonly known as:  TEGRETOL   LORazepam 0.5 MG tablet Commonly known as:  ATIVAN   mupirocin ointment 2 % Commonly known as:  BACTROBAN     TAKE these medications     Indication  ARIPiprazole 5 MG tablet Commonly known as:  ABILIFY Take 1 tablet (5 mg total) by mouth daily. Start taking on:  03/25/2018  Indication:  Major Depressive Disorder   atenolol 50 MG tablet Commonly known as:  TENORMIN Take 50 mg by mouth daily.  Indication:  High Blood Pressure Disorder   traZODone 150 MG tablet Commonly known as:  DESYREL Take 0.5 tablets (75 mg total) by mouth at bedtime.  Indication:  Anxiety Disorder, [place]. Go on 03/27/2018.   Why:  Please attend your therapy appt on Tuesday, 03/27/18, at 12noon. Contact information: [address],  [place], [place] [address] P: [phone] F: [place]. Go on 04/04/2018.   Why:  Please attend your medication appt with Stephannie Peters on Wednesday, 04/04/18, at 1:15pm.  If you prefer to meet with Dr Darleene Cleaver for future appts please let them know. Contact information: [place] [address] [phone]           Follow-up recommendations:  Continue activity as tolerated. Continue diet as recommended by your PCP. Ensure to keep all appointments with outpatient providers.  Comments:  Patient is instructed prior to discharge to: Take all medications as prescribed by his/her mental healthcare provider. Report any adverse effects and or reactions from the medicines to his/her outpatient provider promptly. Patient has been instructed & cautioned: To not engage in alcohol and or illegal drug use while on prescription medicines. In the event of worsening symptoms, patient is instructed to call the crisis hotline, 911 and or go to the nearest ED for appropriate evaluation and treatment of symptoms. To follow-up with his/her primary care provider for your other medical issues, concerns and or health care needs.    Signed: [place], FNP 03/24/2018, 9:55 AM   Patient seen, Suicide Assessment Completed.  Disposition Plan Reviewed

## 2018-03-24 NOTE — [facility] Group Notes (Signed)
LCSW Group Therapy Note  03/24/2018   10:30--11:30am   Type of Therapy and Topic:  Group Therapy: Anger Cues and Responses  Participation Level:  Active   Description of Group:   In this group, patients learned how to recognize the physical, cognitive, emotional, and behavioral responses they have to anger-provoking situations.  They identified a recent time they became angry and how they reacted.  They analyzed how their reaction was possibly beneficial and how it was possibly unhelpful.  The group discussed a variety of healthier coping skills that could help with such a situation in the future.  Deep breathing was practiced briefly.  Therapeutic Goals: 1. Patients will remember their last incident of anger and how they felt emotionally and physically, what their thoughts were at the time, and how they behaved. 2. Patients will identify how their behavior at that time worked for them, as well as how it worked against them. 3. Patients will explore possible new behaviors to use in future anger situations. 4. Patients will learn that anger itself is normal and cannot be eliminated, and that healthier reactions can assist with resolving conflict rather than worsening situations.  Summary of Patient Progress:  The patient shared that her most recent time of anger was yesterday and said this was caused by the schedule not being followed and thus the patient not getting recreation time off the unit as scheduled.  Physically she is accustomed to more activity and it helps her to reduce anxiety and frustration to be able to exercise.  She instead chose to walk vigorously on the unit and to vent to her co-patients.  Therapeutic Modalities:   Cognitive Behavioral Therapy  Destiny Elliott

## 2018-03-24 NOTE — Progress Notes (Signed)
  [facility] Discharge Plan :  Will you be returning to the same living situation after discharge:  Yes,  with family At discharge, do you have transportation home?: Yes,  husband Do you have the ability to pay for your medications: Yes,  patient denies barriers  Release of information consent forms completed and turned in to Medical Records by CSW.   Patient to Follow up at: Follow-up Information    [facility]. Go on 03/27/2018.   Why:  Please attend your therapy appt on Tuesday, 03/27/18, at 12noon. Contact information: [address]  [place], [place] [address] P: [phone] F: [place]. Go on 04/04/2018.   Why:  Please attend your medication appt with Stephannie Peters on Wednesday, 04/04/18, at 1:15pm.  If you prefer to meet with Dr Darleene Cleaver for future appts please let them know. Contact information: [place] [address] [phone]           Next level of care provider has access to [place] and Suicide Prevention discussed: Yes,  with husband  Have you used any form of tobacco in the last 30 days? (Cigarettes, Smokeless Tobacco, Cigars, and/or Pipes): No  Has patient been referred to the Quitline?: N/A patient is not a smoker  Patient has been referred for addiction treatment: N/A  Maretta Los, LCSW 03/24/2018, 9:09 AM

## 2018-03-24 NOTE — Progress Notes (Signed)
Pt received both written and verbal discharge instructions. Pt verbalized understanding of discharge instructions. Pt agreed to f/u appt and med regimen. Pt received sample meds, prescriptions and d/c packet. Pt gathered belongings from room and locker. Pt safely discharged to the lobby.

## 2018-03-27 DIAGNOSIS — F411 Generalized anxiety disorder: Secondary | ICD-10-CM | POA: Diagnosis not present

## 2018-03-27 DIAGNOSIS — R69 Illness, unspecified: Secondary | ICD-10-CM | POA: Diagnosis not present

## 2018-03-29 DIAGNOSIS — F411 Generalized anxiety disorder: Secondary | ICD-10-CM | POA: Diagnosis not present

## 2018-03-29 DIAGNOSIS — R69 Illness, unspecified: Secondary | ICD-10-CM | POA: Diagnosis not present

## 2018-04-03 DIAGNOSIS — R69 Illness, unspecified: Secondary | ICD-10-CM | POA: Diagnosis not present

## 2018-04-03 DIAGNOSIS — F411 Generalized anxiety disorder: Secondary | ICD-10-CM | POA: Diagnosis not present

## 2018-04-04 DIAGNOSIS — F411 Generalized anxiety disorder: Secondary | ICD-10-CM | POA: Diagnosis not present

## 2018-04-04 DIAGNOSIS — R69 Illness, unspecified: Secondary | ICD-10-CM | POA: Diagnosis not present

## 2018-04-10 DIAGNOSIS — F411 Generalized anxiety disorder: Secondary | ICD-10-CM | POA: Diagnosis not present

## 2018-04-10 DIAGNOSIS — R69 Illness, unspecified: Secondary | ICD-10-CM | POA: Diagnosis not present

## 2018-04-12 DIAGNOSIS — R69 Illness, unspecified: Secondary | ICD-10-CM | POA: Diagnosis not present

## 2018-04-12 DIAGNOSIS — F411 Generalized anxiety disorder: Secondary | ICD-10-CM | POA: Diagnosis not present

## 2018-04-17 DIAGNOSIS — F411 Generalized anxiety disorder: Secondary | ICD-10-CM | POA: Diagnosis not present

## 2018-04-17 DIAGNOSIS — R69 Illness, unspecified: Secondary | ICD-10-CM | POA: Diagnosis not present

## 2018-04-19 DIAGNOSIS — R69 Illness, unspecified: Secondary | ICD-10-CM | POA: Diagnosis not present

## 2018-04-19 DIAGNOSIS — F411 Generalized anxiety disorder: Secondary | ICD-10-CM | POA: Diagnosis not present

## 2018-04-24 DIAGNOSIS — F411 Generalized anxiety disorder: Secondary | ICD-10-CM | POA: Diagnosis not present

## 2018-04-24 DIAGNOSIS — R69 Illness, unspecified: Secondary | ICD-10-CM | POA: Diagnosis not present

## 2018-04-26 DIAGNOSIS — R69 Illness, unspecified: Secondary | ICD-10-CM | POA: Diagnosis not present

## 2018-04-26 DIAGNOSIS — F411 Generalized anxiety disorder: Secondary | ICD-10-CM | POA: Diagnosis not present

## 2018-05-02 DIAGNOSIS — R69 Illness, unspecified: Secondary | ICD-10-CM | POA: Diagnosis not present

## 2018-05-02 DIAGNOSIS — F332 Major depressive disorder, recurrent severe without psychotic features: Secondary | ICD-10-CM | POA: Diagnosis not present

## 2018-05-02 DIAGNOSIS — F411 Generalized anxiety disorder: Secondary | ICD-10-CM | POA: Diagnosis not present

## 2018-05-05 DIAGNOSIS — J069 Acute upper respiratory infection, unspecified: Secondary | ICD-10-CM | POA: Diagnosis not present

## 2018-05-10 DIAGNOSIS — R69 Illness, unspecified: Secondary | ICD-10-CM | POA: Diagnosis not present

## 2018-05-10 DIAGNOSIS — J329 Chronic sinusitis, unspecified: Secondary | ICD-10-CM | POA: Diagnosis not present

## 2018-05-10 DIAGNOSIS — F411 Generalized anxiety disorder: Secondary | ICD-10-CM | POA: Diagnosis not present

## 2018-05-15 DIAGNOSIS — F411 Generalized anxiety disorder: Secondary | ICD-10-CM | POA: Diagnosis not present

## 2018-05-15 DIAGNOSIS — R69 Illness, unspecified: Secondary | ICD-10-CM | POA: Diagnosis not present

## 2018-05-16 DIAGNOSIS — Z131 Encounter for screening for diabetes mellitus: Secondary | ICD-10-CM | POA: Diagnosis not present

## 2018-05-16 DIAGNOSIS — Z79899 Other long term (current) drug therapy: Secondary | ICD-10-CM | POA: Diagnosis not present

## 2018-05-16 DIAGNOSIS — I10 Essential (primary) hypertension: Secondary | ICD-10-CM | POA: Diagnosis not present

## 2018-05-16 DIAGNOSIS — I739 Peripheral vascular disease, unspecified: Secondary | ICD-10-CM | POA: Diagnosis not present

## 2018-05-16 DIAGNOSIS — Z0000 Encounter for general adult medical examination without abnormal findings: Secondary | ICD-10-CM | POA: Diagnosis not present

## 2018-05-16 DIAGNOSIS — R69 Illness, unspecified: Secondary | ICD-10-CM | POA: Diagnosis not present

## 2018-05-16 DIAGNOSIS — E559 Vitamin D deficiency, unspecified: Secondary | ICD-10-CM | POA: Diagnosis not present

## 2018-05-22 DIAGNOSIS — R69 Illness, unspecified: Secondary | ICD-10-CM | POA: Diagnosis not present

## 2018-05-22 DIAGNOSIS — F411 Generalized anxiety disorder: Secondary | ICD-10-CM | POA: Diagnosis not present

## 2018-05-29 DIAGNOSIS — R69 Illness, unspecified: Secondary | ICD-10-CM | POA: Diagnosis not present

## 2018-05-29 DIAGNOSIS — F411 Generalized anxiety disorder: Secondary | ICD-10-CM | POA: Diagnosis not present

## 2018-05-29 DIAGNOSIS — F332 Major depressive disorder, recurrent severe without psychotic features: Secondary | ICD-10-CM | POA: Diagnosis not present

## 2018-05-30 DIAGNOSIS — J310 Chronic rhinitis: Secondary | ICD-10-CM | POA: Diagnosis not present

## 2018-05-30 DIAGNOSIS — H6983 Other specified disorders of Eustachian tube, bilateral: Secondary | ICD-10-CM | POA: Diagnosis not present

## 2018-05-30 DIAGNOSIS — H903 Sensorineural hearing loss, bilateral: Secondary | ICD-10-CM | POA: Diagnosis not present

## 2018-06-05 DIAGNOSIS — F411 Generalized anxiety disorder: Secondary | ICD-10-CM | POA: Diagnosis not present

## 2018-06-05 DIAGNOSIS — R69 Illness, unspecified: Secondary | ICD-10-CM | POA: Diagnosis not present

## 2018-06-14 DIAGNOSIS — R69 Illness, unspecified: Secondary | ICD-10-CM | POA: Diagnosis not present

## 2018-06-14 DIAGNOSIS — F411 Generalized anxiety disorder: Secondary | ICD-10-CM | POA: Diagnosis not present

## 2018-06-19 DIAGNOSIS — R69 Illness, unspecified: Secondary | ICD-10-CM | POA: Diagnosis not present

## 2018-06-19 DIAGNOSIS — F411 Generalized anxiety disorder: Secondary | ICD-10-CM | POA: Diagnosis not present

## 2018-06-26 DIAGNOSIS — R69 Illness, unspecified: Secondary | ICD-10-CM | POA: Diagnosis not present

## 2018-06-26 DIAGNOSIS — F332 Major depressive disorder, recurrent severe without psychotic features: Secondary | ICD-10-CM | POA: Diagnosis not present

## 2018-06-26 DIAGNOSIS — F411 Generalized anxiety disorder: Secondary | ICD-10-CM | POA: Diagnosis not present

## 2018-07-03 DIAGNOSIS — R69 Illness, unspecified: Secondary | ICD-10-CM | POA: Diagnosis not present

## 2018-07-03 DIAGNOSIS — F411 Generalized anxiety disorder: Secondary | ICD-10-CM | POA: Diagnosis not present

## 2018-07-10 DIAGNOSIS — R69 Illness, unspecified: Secondary | ICD-10-CM | POA: Diagnosis not present

## 2018-07-10 DIAGNOSIS — F411 Generalized anxiety disorder: Secondary | ICD-10-CM | POA: Diagnosis not present

## 2018-07-23 DIAGNOSIS — R69 Illness, unspecified: Secondary | ICD-10-CM | POA: Diagnosis not present

## 2018-07-23 DIAGNOSIS — F332 Major depressive disorder, recurrent severe without psychotic features: Secondary | ICD-10-CM | POA: Diagnosis not present

## 2018-07-23 DIAGNOSIS — F411 Generalized anxiety disorder: Secondary | ICD-10-CM | POA: Diagnosis not present

## 2018-07-24 DIAGNOSIS — R69 Illness, unspecified: Secondary | ICD-10-CM | POA: Diagnosis not present

## 2018-07-24 DIAGNOSIS — F411 Generalized anxiety disorder: Secondary | ICD-10-CM | POA: Diagnosis not present

## 2018-07-31 DIAGNOSIS — F411 Generalized anxiety disorder: Secondary | ICD-10-CM | POA: Diagnosis not present

## 2018-07-31 DIAGNOSIS — R69 Illness, unspecified: Secondary | ICD-10-CM | POA: Diagnosis not present

## 2018-08-14 DIAGNOSIS — R69 Illness, unspecified: Secondary | ICD-10-CM | POA: Diagnosis not present

## 2018-08-14 DIAGNOSIS — F411 Generalized anxiety disorder: Secondary | ICD-10-CM | POA: Diagnosis not present

## 2018-08-20 DIAGNOSIS — H10502 Unspecified blepharoconjunctivitis, left eye: Secondary | ICD-10-CM | POA: Diagnosis not present

## 2018-08-20 DIAGNOSIS — H02055 Trichiasis without entropian left lower eyelid: Secondary | ICD-10-CM | POA: Diagnosis not present

## 2018-08-22 DIAGNOSIS — F332 Major depressive disorder, recurrent severe without psychotic features: Secondary | ICD-10-CM | POA: Diagnosis not present

## 2018-08-22 DIAGNOSIS — F411 Generalized anxiety disorder: Secondary | ICD-10-CM | POA: Diagnosis not present

## 2018-08-22 DIAGNOSIS — R69 Illness, unspecified: Secondary | ICD-10-CM | POA: Diagnosis not present

## 2018-08-28 DIAGNOSIS — F411 Generalized anxiety disorder: Secondary | ICD-10-CM | POA: Diagnosis not present

## 2018-08-28 DIAGNOSIS — R69 Illness, unspecified: Secondary | ICD-10-CM | POA: Diagnosis not present

## 2018-09-03 DIAGNOSIS — F411 Generalized anxiety disorder: Secondary | ICD-10-CM | POA: Diagnosis not present

## 2018-09-03 DIAGNOSIS — R69 Illness, unspecified: Secondary | ICD-10-CM | POA: Diagnosis not present

## 2018-09-04 DIAGNOSIS — H02055 Trichiasis without entropian left lower eyelid: Secondary | ICD-10-CM | POA: Diagnosis not present

## 2018-09-19 DIAGNOSIS — F411 Generalized anxiety disorder: Secondary | ICD-10-CM | POA: Diagnosis not present

## 2018-09-19 DIAGNOSIS — R69 Illness, unspecified: Secondary | ICD-10-CM | POA: Diagnosis not present

## 2018-09-19 DIAGNOSIS — F332 Major depressive disorder, recurrent severe without psychotic features: Secondary | ICD-10-CM | POA: Diagnosis not present

## 2018-09-25 DIAGNOSIS — R69 Illness, unspecified: Secondary | ICD-10-CM | POA: Diagnosis not present

## 2018-09-25 DIAGNOSIS — F411 Generalized anxiety disorder: Secondary | ICD-10-CM | POA: Diagnosis not present

## 2018-10-09 DIAGNOSIS — F411 Generalized anxiety disorder: Secondary | ICD-10-CM | POA: Diagnosis not present

## 2018-10-09 DIAGNOSIS — R69 Illness, unspecified: Secondary | ICD-10-CM | POA: Diagnosis not present

## 2018-10-17 DIAGNOSIS — R69 Illness, unspecified: Secondary | ICD-10-CM | POA: Diagnosis not present

## 2018-10-17 DIAGNOSIS — F411 Generalized anxiety disorder: Secondary | ICD-10-CM | POA: Diagnosis not present

## 2018-10-17 DIAGNOSIS — F332 Major depressive disorder, recurrent severe without psychotic features: Secondary | ICD-10-CM | POA: Diagnosis not present

## 2018-10-30 DIAGNOSIS — F411 Generalized anxiety disorder: Secondary | ICD-10-CM | POA: Diagnosis not present

## 2018-10-30 DIAGNOSIS — R69 Illness, unspecified: Secondary | ICD-10-CM | POA: Diagnosis not present

## 2018-11-15 DIAGNOSIS — F332 Major depressive disorder, recurrent severe without psychotic features: Secondary | ICD-10-CM | POA: Diagnosis not present

## 2018-11-15 DIAGNOSIS — R69 Illness, unspecified: Secondary | ICD-10-CM | POA: Diagnosis not present

## 2018-11-15 DIAGNOSIS — F411 Generalized anxiety disorder: Secondary | ICD-10-CM | POA: Diagnosis not present

## 2018-11-20 DIAGNOSIS — I10 Essential (primary) hypertension: Secondary | ICD-10-CM | POA: Diagnosis not present

## 2018-11-20 DIAGNOSIS — J069 Acute upper respiratory infection, unspecified: Secondary | ICD-10-CM | POA: Diagnosis not present

## 2018-11-21 ENCOUNTER — Other Ambulatory Visit: Payer: Self-pay | Admitting: Family Medicine

## 2018-11-21 DIAGNOSIS — Z1231 Encounter for screening mammogram for malignant neoplasm of breast: Secondary | ICD-10-CM

## 2018-11-23 DIAGNOSIS — J069 Acute upper respiratory infection, unspecified: Secondary | ICD-10-CM | POA: Diagnosis not present

## 2018-11-26 DIAGNOSIS — R69 Illness, unspecified: Secondary | ICD-10-CM | POA: Diagnosis not present

## 2018-11-26 DIAGNOSIS — F332 Major depressive disorder, recurrent severe without psychotic features: Secondary | ICD-10-CM | POA: Diagnosis not present

## 2018-11-26 DIAGNOSIS — F411 Generalized anxiety disorder: Secondary | ICD-10-CM | POA: Diagnosis not present

## 2018-11-27 DIAGNOSIS — F411 Generalized anxiety disorder: Secondary | ICD-10-CM | POA: Diagnosis not present

## 2018-11-27 DIAGNOSIS — R69 Illness, unspecified: Secondary | ICD-10-CM | POA: Diagnosis not present

## 2018-11-30 IMAGING — MR MR HEAD W/O CM
9 of 10 series · 34 of 48 positions shown · non-contrast
Comparison: None.

CLINICAL DATA: Ataxia.  Possible stroke.

EXAM:
MRI HEAD WITHOUT CONTRAST
TECHNIQUE: Multiplanar, multiecho pulse sequences of the brain and surrounding
structures were obtained without intravenous contrast.

[Series 3: DWI · axial · 3.0mm · 0.94mm/px · z∈[-77,+70]mm · 9 of 100 slices shown (1 of 2)]
[im 1/100]
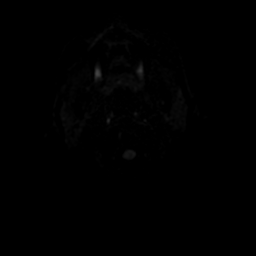
[im 13/100]
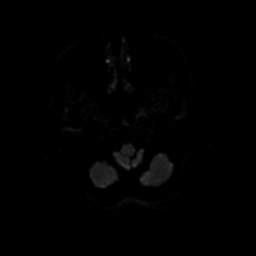
[im 25/100]
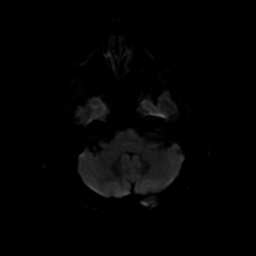
[im 38/100]
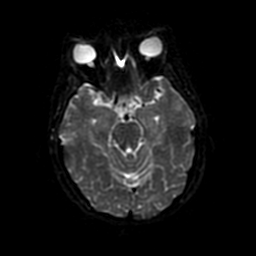
[im 50/100]
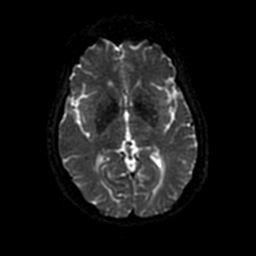
[im 62/100]
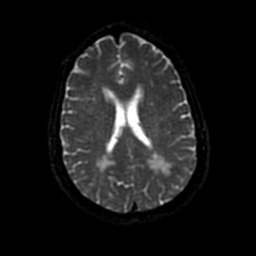
[im 75/100]
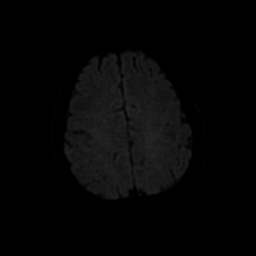
[im 87/100]
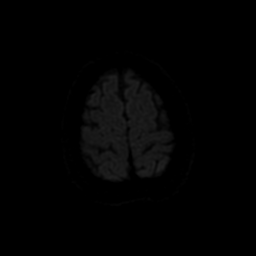
[im 100/100]
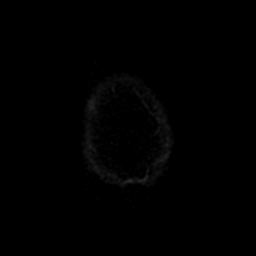

[Series 4: DWI · coronal · 4.0mm · 0.94mm/px · 6 of 72 slices shown (2 of 2)]
[im 1/72]
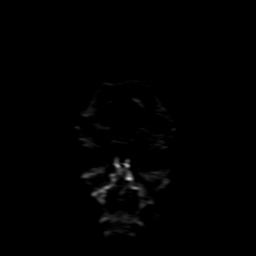
[im 15/72]
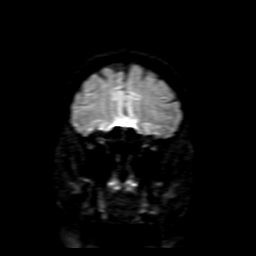
[im 29/72]
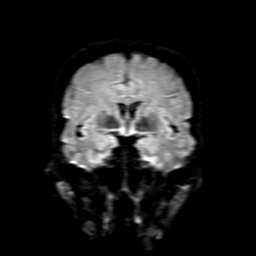
[im 43/72]
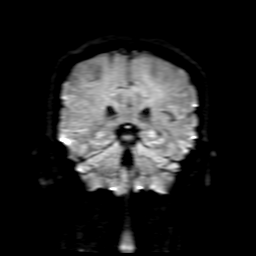
[im 57/72]
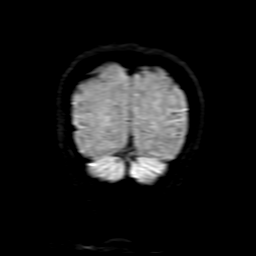
[im 72/72]
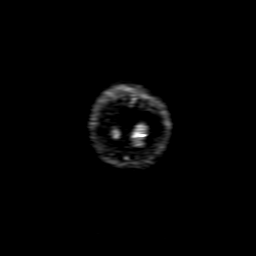

[Series 5: T2 · axial · 5.0mm · 0.47mm/px · z∈[-78,+84]mm · 3 of 28 slices shown (1 of 2)]
[im 1/28]
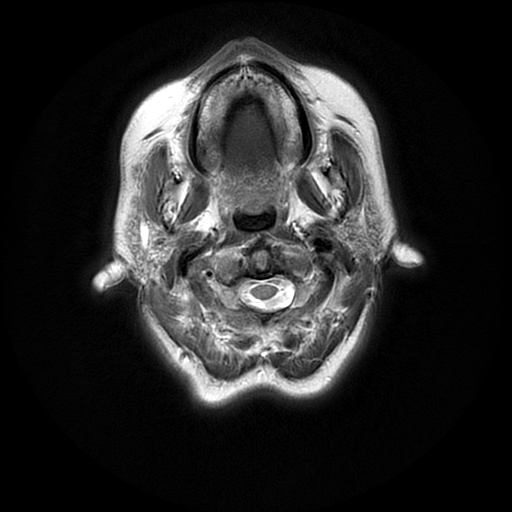
[im 14/28]
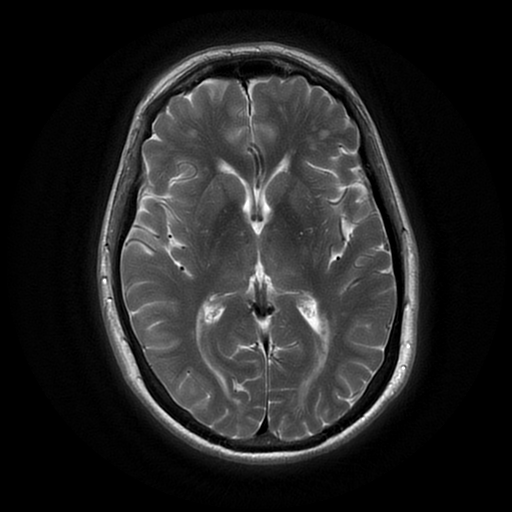
[im 28/28]
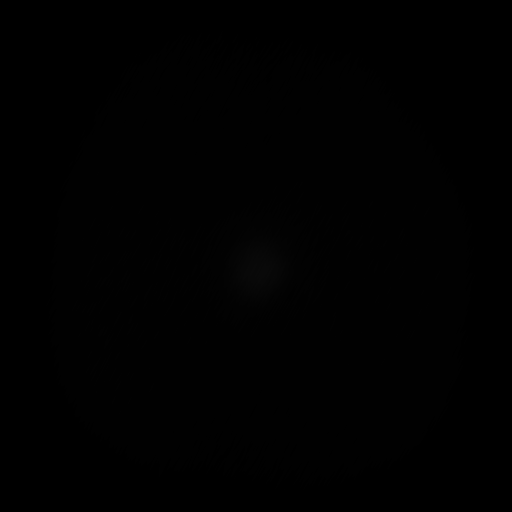

[Series 6: FLAIR · axial · 5.0mm · 0.47mm/px · z∈[-78,+84]mm · 3 of 28 slices shown (1 of 2)]
[im 1/28]
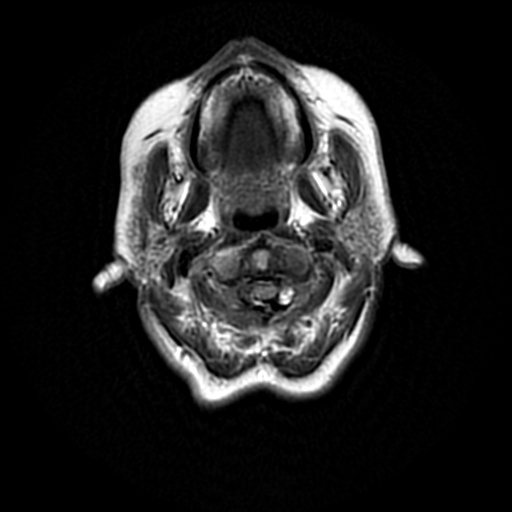
[im 14/28]
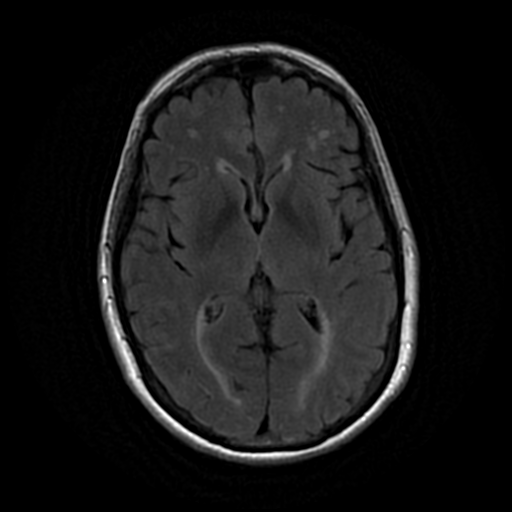
[im 28/28]
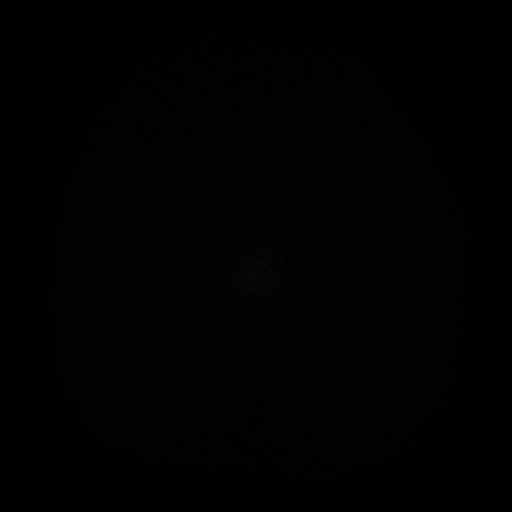

[Series 7: (person_name) · axial · 3.0mm · 0.47mm/px · 1 of 112 slices shown]
[im 1/112]
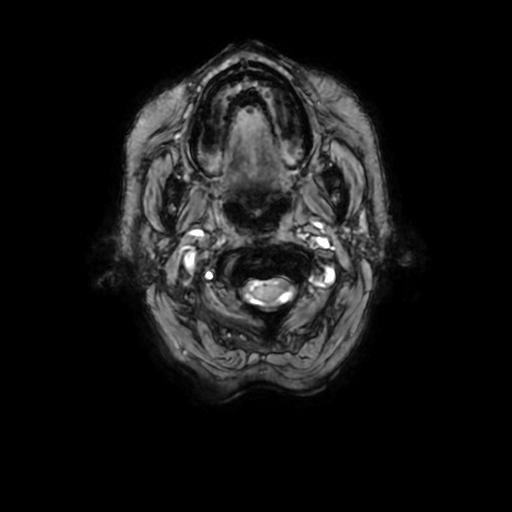

[Series 9: FLAIR · sagittal · 5.0mm · 0.47mm/px · 2 of 23 slices shown (2 of 2)]
[im 1/23]
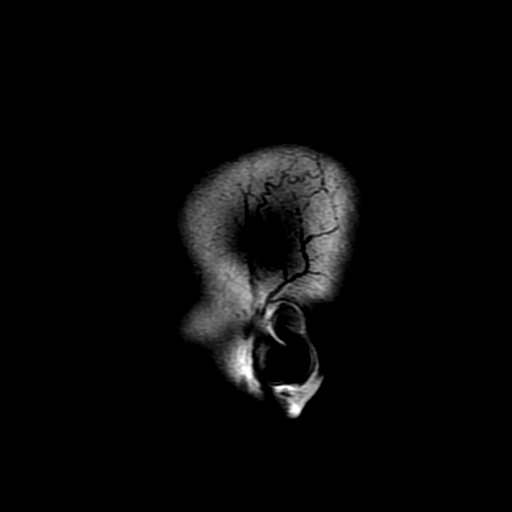
[im 23/23]
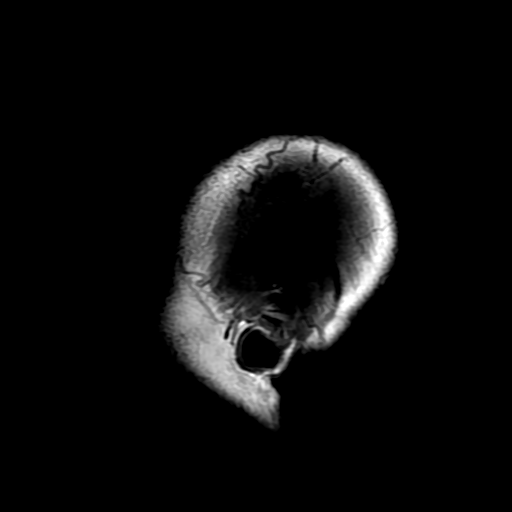

[Series 10: T2 · coronal · 5.0mm · 0.47mm/px · 3 of 30 slices shown (2 of 2)]
[im 1/30]
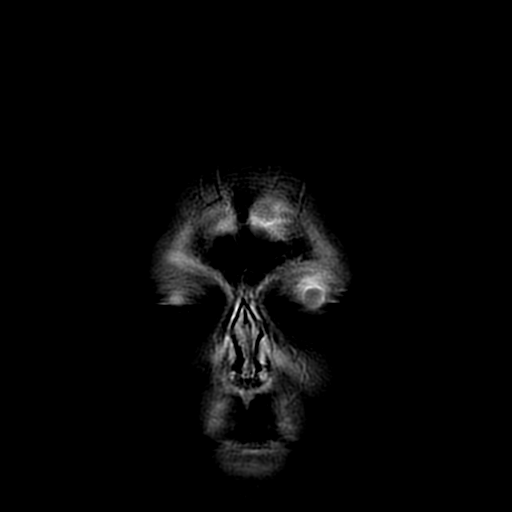
[im 15/30]
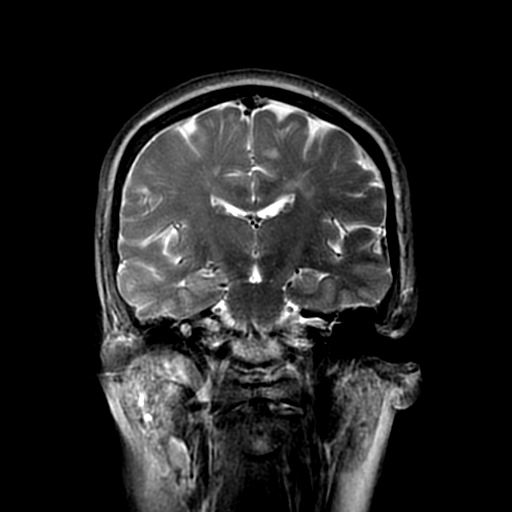
[im 30/30]
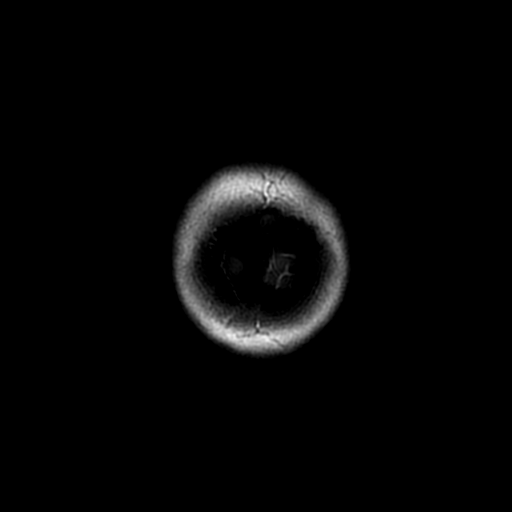

[Series 350: ADC · axial · 3.0mm · 0.94mm/px · z∈[-77,+70]mm · 4 of 50 slices shown (1 of 2)]
[im 1/50]
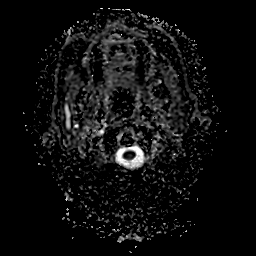
[im 17/50]
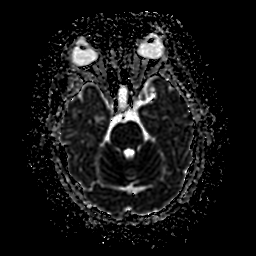
[im 33/50]
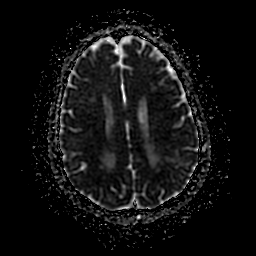
[im 50/50]
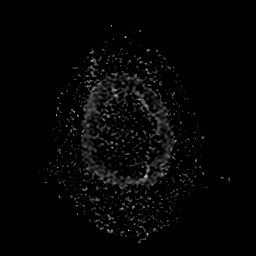

[Series 450: ADC · coronal · 4.0mm · 0.94mm/px · 3 of 36 slices shown (2 of 2)]
[im 1/36]
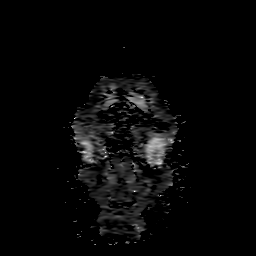
[im 18/36]
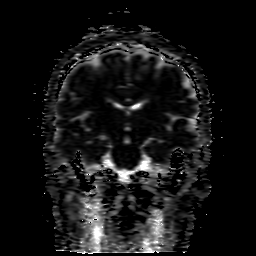
[im 36/36]
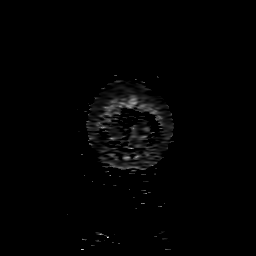

[34 of 48 positions shown; findings below may reference images not displayed]

FINDINGS: Brain: Diffusion imaging does not show any acute or subacute
infarction. No abnormality is seen affecting the brainstem or
cerebellum. Within the cerebral hemispheres, there are widespread
chronic appearing areas of abnormal T2 and FLAIR signal within the
deep and subcortical white matter, most prominent in the forceps
major regions. Usually, this is an indication of chronic small
vessel ischemic change. The differential diagnosis is that of
demyelinating disease/ multiple sclerosis. No large vessel territory
or cortical insult. No intra-axial mass lesion, hemorrhage,
hydrocephalus or subdural collection. There is a 2 cm incidental
arachnoid cyst at the left CP angle region.

Vascular: Major vessels at the base of the brain show flow.

Skull and upper cervical spine: Negative

Sinuses/Orbits: Clear/normal

Other: None
IMPRESSION: No acute or subacute insult by MRI.

Moderate hemispheric white matter disease, usually secondary to
small vessel ischemic change. The differential diagnosis in this
case does include demyelinating disease/ multiple sclerosis.

## 2018-12-10 DIAGNOSIS — F332 Major depressive disorder, recurrent severe without psychotic features: Secondary | ICD-10-CM | POA: Diagnosis not present

## 2018-12-10 DIAGNOSIS — R69 Illness, unspecified: Secondary | ICD-10-CM | POA: Diagnosis not present

## 2018-12-10 DIAGNOSIS — F411 Generalized anxiety disorder: Secondary | ICD-10-CM | POA: Diagnosis not present

## 2018-12-11 DIAGNOSIS — R69 Illness, unspecified: Secondary | ICD-10-CM | POA: Diagnosis not present

## 2018-12-11 DIAGNOSIS — F411 Generalized anxiety disorder: Secondary | ICD-10-CM | POA: Diagnosis not present

## 2018-12-18 DIAGNOSIS — R69 Illness, unspecified: Secondary | ICD-10-CM | POA: Diagnosis not present

## 2018-12-18 DIAGNOSIS — F411 Generalized anxiety disorder: Secondary | ICD-10-CM | POA: Diagnosis not present

## 2018-12-25 ENCOUNTER — Ambulatory Visit
Admission: RE | Admit: 2018-12-25 | Discharge: 2018-12-25 | Disposition: A | Discharge status: Home / Self Care | Payer: Medicare HMO | Source: Ambulatory Visit | Attending: Family Medicine | Admitting: Family Medicine

## 2018-12-25 DIAGNOSIS — R69 Illness, unspecified: Secondary | ICD-10-CM | POA: Diagnosis not present

## 2018-12-25 DIAGNOSIS — Z1231 Encounter for screening mammogram for malignant neoplasm of breast: Secondary | ICD-10-CM

## 2018-12-25 DIAGNOSIS — F411 Generalized anxiety disorder: Secondary | ICD-10-CM | POA: Diagnosis not present

## 2018-12-28 ENCOUNTER — Encounter: Payer: Self-pay | Admitting: Certified Nurse Midwife

## 2018-12-28 ENCOUNTER — Ambulatory Visit (INDEPENDENT_AMBULATORY_CARE_PROVIDER_SITE_OTHER): Payer: Medicare HMO | Admitting: Certified Nurse Midwife

## 2018-12-28 ENCOUNTER — Other Ambulatory Visit: Payer: Self-pay

## 2018-12-28 VITALS — BP 130/68 | HR 68 | Resp 16 | Ht 61.0 in | Wt 126.0 lb

## 2018-12-28 DIAGNOSIS — Z8739 Personal history of other diseases of the musculoskeletal system and connective tissue: Secondary | ICD-10-CM | POA: Diagnosis not present

## 2018-12-28 DIAGNOSIS — Z01419 Encounter for gynecological examination (general) (routine) without abnormal findings: Secondary | ICD-10-CM | POA: Diagnosis not present

## 2018-12-28 DIAGNOSIS — Z780 Asymptomatic menopausal state: Secondary | ICD-10-CM

## 2018-12-28 DIAGNOSIS — E559 Vitamin D deficiency, unspecified: Secondary | ICD-10-CM | POA: Diagnosis not present

## 2018-12-28 NOTE — Progress Notes (Signed)
68 y.o. G69P2002 Married  Caucasian Fe here for annual exam. Menopausal no HRT. Denies vaginal bleeding or vaginal dryness. Sees PCP for aex, labs. See Psychiatry for Abilify/Trazadone management. All stable per patient. Stopped working and was not fulfilled, so working again. Working on calcium in diet due to Osteopenia with BMD. Some increase in varicose veins, no pain. No other health issues today.   Patient's last menstrual period was 10/14/2002 (approximate).          Sexually active: Yes.    The current method of family planning is vasectomy.    Exercising: Yes.    weights & walking Smoker:  no  Review of Systems  Constitutional: Negative.   HENT: Negative.   Eyes: Negative.   Respiratory: Negative.   Cardiovascular: Negative.   Gastrointestinal: Negative.   Genitourinary: Negative.   Musculoskeletal: Negative.   Skin: Negative.   Neurological: Negative.   Endo/Heme/Allergies: Negative.   Psychiatric/Behavioral: Negative.     Health Maintenance: Pap:  12-04-15 neg HPV HR neg, 12-19-17 neg HPV HR neg History of Abnormal Pap: yes MMG:  12-25-2018 category c density birads 1:neg Self Breast exams: yes Colonoscopy:  2015 hyperplastic polyp f/u 6yrs BMD:   2019  osteopenia TDaP:  2015 Shingles: none Pneumonia: none Hep C and HIV: both neg 2017 Labs: vitamin D   reports that she quit smoking about 14 years ago. Her smoking use included cigarettes. She has a 14.00 pack-year smoking history. She has never used smokeless tobacco. She reports current alcohol use. She reports that she does not use drugs.  Past Medical History:  Diagnosis Date  . Abnormal Pap smear 1994   --CIN I  . Allergic rhinitis   . Anxiety   . Dense breasts   . Endocervical polyp 01/1993   -benign  . Hypercholesterolemia   . Hypertension   . Osteopenia     Past Surgical History:  Procedure Laterality Date  . CERVICAL BIOPSY  W/ LOOP ELECTRODE EXCISION  03/1993   CIN I  . COLONOSCOPY  2016  .  endocervical polyp  01-27-93   -benign  . TOOTH EXTRACTION  08/2017    Current Outpatient Medications  Medication Sig Dispense Refill  . ARIPiprazole (ABILIFY) 5 MG tablet Take 1 tablet (5 mg total) by mouth daily. 30 tablet 0  . atenolol (TENORMIN) 50 MG tablet Take 50 mg by mouth daily.     . traZODone (DESYREL) 150 MG tablet Take 0.5 tablets (75 mg total) by mouth at bedtime. 15 tablet 0   No current facility-administered medications for this visit.     Family History  Problem Relation Age of Onset  . Stroke Father 82  . Kidney disease Father   . Hypertension Father   . Hypertension Sister 31  . Hyperlipidemia Sister   . COPD Sister   . Breast cancer Sister 34  . Thyroid disease Mother 68  . Heart failure Mother   . Heart disease Mother   . Hypertension Mother   . COPD Mother   . Heart disease Brother 47       AFIB  . Suicidality Son 39       07/20/2013  . Thyroid disease Sister 51    ROS:  Pertinent items are noted in HPI.  Otherwise, a comprehensive ROS was negative.  Exam:   LMP 10/14/2002 (Approximate)    Ht Readings from Last 3 Encounters:  12/19/17 5\' 1"  (1.549 m)  11/22/17 5\' 2"  (1.575 m)  09/25/17 5\' 2"  (1.575  m)    General appearance: alert, cooperative and appears stated age Head: Normocephalic, without obvious abnormality, atraumatic Neck: no adenopathy, supple, symmetrical, trachea midline and thyroid normal to inspection and palpation Lungs: clear to auscultation bilaterally Breasts: normal appearance, no masses or tenderness, No nipple retraction or dimpling, No nipple discharge or bleeding, No axillary or supraclavicular adenopathy Heart: regular rate and rhythm Abdomen: soft, non-tender; no masses,  no organomegaly Extremities: extremities normal, atraumatic, no cyanosis or edema Skin: Skin color, texture, turgor normal. No rashes or lesions Lymph nodes: Cervical, supraclavicular, and axillary nodes normal. No abnormal inguinal nodes  palpated Neurologic: Grossly normal   Pelvic: External genitalia:  no lesions              Urethra:  normal appearing urethra with no masses, tenderness or lesions              Bartholin's and Skene's: normal                 Vagina: normal appearing vagina with normal color and discharge, no lesions              Cervix: no cervical motion tenderness, no lesions and normal appearance              Pap taken: No. Bimanual Exam:  Uterus:  normal size, contour, position, consistency, mobility, non-tender and anteverted              Adnexa: normal adnexa and no mass, fullness, tenderness               Rectovaginal: Confirms               Anus:  normal sphincter tone, no lesions  Chaperone present: yes  A:  Well Woman with normal exam  Post menopausal no HRT  Osteopenia   R/O vitamin D deficiency  P:   Reviewed health and wellness pertinent to exam  Discussed need to advise if vaginal bleeding.   Discussed calcium in diet importance and Vitamin D.  Lab: Vitamin D  Pap smear: no   counseled on breast self exam, mammography screening, feminine hygiene, adequate intake of calcium and vitamin D, diet and exercise, Kegel's exercises  return annually or prn  An After Visit Summary was printed and given to the patient.

## 2018-12-28 NOTE — Patient Instructions (Signed)

## 2018-12-29 LAB — VITAMIN D 25 HYDROXY (VIT D DEFICIENCY, FRACTURES): Vit D, 25-Hydroxy: 30.7 ng/mL (ref 30.0–100.0)

## 2019-01-01 ENCOUNTER — Telehealth: Payer: Self-pay | Admitting: Certified Nurse Midwife

## 2019-01-01 DIAGNOSIS — R69 Illness, unspecified: Secondary | ICD-10-CM | POA: Diagnosis not present

## 2019-01-01 DIAGNOSIS — F411 Generalized anxiety disorder: Secondary | ICD-10-CM | POA: Diagnosis not present

## 2019-01-01 NOTE — Telephone Encounter (Signed)
Patient is calling for her recent vitamin d result.

## 2019-01-01 NOTE — Telephone Encounter (Signed)
Spoke with patient, advised as seen below per Melvia Heaps, CNM. Patient verbalizes understanding and is agreeable.   Notes recorded by Regina Eck, CNM on 12/30/2018 at 5:02 PM EST Notify patient her Vitamin D is just normal at 30.7, needs to be in 40-50 range for bone support with calcium in Menopause. She needs to start on Vitamin D 3 1000 IU daily.   Encounter closed.

## 2019-01-07 DIAGNOSIS — R69 Illness, unspecified: Secondary | ICD-10-CM | POA: Diagnosis not present

## 2019-01-07 DIAGNOSIS — F332 Major depressive disorder, recurrent severe without psychotic features: Secondary | ICD-10-CM | POA: Diagnosis not present

## 2019-01-07 DIAGNOSIS — F411 Generalized anxiety disorder: Secondary | ICD-10-CM | POA: Diagnosis not present

## 2019-01-15 DIAGNOSIS — F411 Generalized anxiety disorder: Secondary | ICD-10-CM | POA: Diagnosis not present

## 2019-01-15 DIAGNOSIS — R69 Illness, unspecified: Secondary | ICD-10-CM | POA: Diagnosis not present

## 2019-01-29 DIAGNOSIS — F411 Generalized anxiety disorder: Secondary | ICD-10-CM | POA: Diagnosis not present

## 2019-01-29 DIAGNOSIS — R69 Illness, unspecified: Secondary | ICD-10-CM | POA: Diagnosis not present

## 2019-02-05 DIAGNOSIS — F411 Generalized anxiety disorder: Secondary | ICD-10-CM | POA: Diagnosis not present

## 2019-02-05 DIAGNOSIS — R69 Illness, unspecified: Secondary | ICD-10-CM | POA: Diagnosis not present

## 2019-02-12 DIAGNOSIS — R69 Illness, unspecified: Secondary | ICD-10-CM | POA: Diagnosis not present

## 2019-02-12 DIAGNOSIS — F411 Generalized anxiety disorder: Secondary | ICD-10-CM | POA: Diagnosis not present

## 2019-02-19 DIAGNOSIS — F411 Generalized anxiety disorder: Secondary | ICD-10-CM | POA: Diagnosis not present

## 2019-02-19 DIAGNOSIS — R69 Illness, unspecified: Secondary | ICD-10-CM | POA: Diagnosis not present

## 2019-03-05 DIAGNOSIS — R69 Illness, unspecified: Secondary | ICD-10-CM | POA: Diagnosis not present

## 2019-03-05 DIAGNOSIS — F411 Generalized anxiety disorder: Secondary | ICD-10-CM | POA: Diagnosis not present

## 2019-03-06 DIAGNOSIS — F411 Generalized anxiety disorder: Secondary | ICD-10-CM | POA: Diagnosis not present

## 2019-03-06 DIAGNOSIS — R69 Illness, unspecified: Secondary | ICD-10-CM | POA: Diagnosis not present

## 2019-03-06 DIAGNOSIS — F332 Major depressive disorder, recurrent severe without psychotic features: Secondary | ICD-10-CM | POA: Diagnosis not present

## 2019-03-12 DIAGNOSIS — F411 Generalized anxiety disorder: Secondary | ICD-10-CM | POA: Diagnosis not present

## 2019-03-12 DIAGNOSIS — R69 Illness, unspecified: Secondary | ICD-10-CM | POA: Diagnosis not present

## 2019-03-19 DIAGNOSIS — R69 Illness, unspecified: Secondary | ICD-10-CM | POA: Diagnosis not present

## 2019-03-19 DIAGNOSIS — F411 Generalized anxiety disorder: Secondary | ICD-10-CM | POA: Diagnosis not present

## 2019-03-26 DIAGNOSIS — F411 Generalized anxiety disorder: Secondary | ICD-10-CM | POA: Diagnosis not present

## 2019-03-26 DIAGNOSIS — R69 Illness, unspecified: Secondary | ICD-10-CM | POA: Diagnosis not present

## 2019-04-02 DIAGNOSIS — R69 Illness, unspecified: Secondary | ICD-10-CM | POA: Diagnosis not present

## 2019-04-02 DIAGNOSIS — F411 Generalized anxiety disorder: Secondary | ICD-10-CM | POA: Diagnosis not present

## 2019-04-09 DIAGNOSIS — R69 Illness, unspecified: Secondary | ICD-10-CM | POA: Diagnosis not present

## 2019-04-09 DIAGNOSIS — F411 Generalized anxiety disorder: Secondary | ICD-10-CM | POA: Diagnosis not present

## 2019-04-23 DIAGNOSIS — F411 Generalized anxiety disorder: Secondary | ICD-10-CM | POA: Diagnosis not present

## 2019-04-23 DIAGNOSIS — R69 Illness, unspecified: Secondary | ICD-10-CM | POA: Diagnosis not present

## 2019-04-30 DIAGNOSIS — F411 Generalized anxiety disorder: Secondary | ICD-10-CM | POA: Diagnosis not present

## 2019-04-30 DIAGNOSIS — R69 Illness, unspecified: Secondary | ICD-10-CM | POA: Diagnosis not present

## 2019-05-14 DIAGNOSIS — R69 Illness, unspecified: Secondary | ICD-10-CM | POA: Diagnosis not present

## 2019-05-14 DIAGNOSIS — F411 Generalized anxiety disorder: Secondary | ICD-10-CM | POA: Diagnosis not present

## 2019-05-21 DIAGNOSIS — F411 Generalized anxiety disorder: Secondary | ICD-10-CM | POA: Diagnosis not present

## 2019-05-21 DIAGNOSIS — R69 Illness, unspecified: Secondary | ICD-10-CM | POA: Diagnosis not present

## 2019-05-28 DIAGNOSIS — F411 Generalized anxiety disorder: Secondary | ICD-10-CM | POA: Diagnosis not present

## 2019-05-28 DIAGNOSIS — R69 Illness, unspecified: Secondary | ICD-10-CM | POA: Diagnosis not present

## 2019-06-04 DIAGNOSIS — R69 Illness, unspecified: Secondary | ICD-10-CM | POA: Diagnosis not present

## 2019-06-04 DIAGNOSIS — F411 Generalized anxiety disorder: Secondary | ICD-10-CM | POA: Diagnosis not present

## 2019-06-18 DIAGNOSIS — F411 Generalized anxiety disorder: Secondary | ICD-10-CM | POA: Diagnosis not present

## 2019-06-18 DIAGNOSIS — R69 Illness, unspecified: Secondary | ICD-10-CM | POA: Diagnosis not present

## 2019-06-20 DIAGNOSIS — I10 Essential (primary) hypertension: Secondary | ICD-10-CM | POA: Diagnosis not present

## 2019-06-20 DIAGNOSIS — E559 Vitamin D deficiency, unspecified: Secondary | ICD-10-CM | POA: Diagnosis not present

## 2019-06-20 DIAGNOSIS — J309 Allergic rhinitis, unspecified: Secondary | ICD-10-CM | POA: Diagnosis not present

## 2019-06-20 DIAGNOSIS — Z79899 Other long term (current) drug therapy: Secondary | ICD-10-CM | POA: Diagnosis not present

## 2019-06-20 DIAGNOSIS — K219 Gastro-esophageal reflux disease without esophagitis: Secondary | ICD-10-CM | POA: Diagnosis not present

## 2019-06-20 DIAGNOSIS — R69 Illness, unspecified: Secondary | ICD-10-CM | POA: Diagnosis not present

## 2019-06-20 DIAGNOSIS — G4700 Insomnia, unspecified: Secondary | ICD-10-CM | POA: Diagnosis not present

## 2019-06-20 DIAGNOSIS — M8580 Other specified disorders of bone density and structure, unspecified site: Secondary | ICD-10-CM | POA: Diagnosis not present

## 2019-06-20 DIAGNOSIS — I739 Peripheral vascular disease, unspecified: Secondary | ICD-10-CM | POA: Diagnosis not present

## 2019-06-20 DIAGNOSIS — Z0000 Encounter for general adult medical examination without abnormal findings: Secondary | ICD-10-CM | POA: Diagnosis not present

## 2019-06-25 DIAGNOSIS — R69 Illness, unspecified: Secondary | ICD-10-CM | POA: Diagnosis not present

## 2019-06-25 DIAGNOSIS — F411 Generalized anxiety disorder: Secondary | ICD-10-CM | POA: Diagnosis not present

## 2019-07-02 DIAGNOSIS — F411 Generalized anxiety disorder: Secondary | ICD-10-CM | POA: Diagnosis not present

## 2019-07-02 DIAGNOSIS — R69 Illness, unspecified: Secondary | ICD-10-CM | POA: Diagnosis not present

## 2019-07-08 DIAGNOSIS — F411 Generalized anxiety disorder: Secondary | ICD-10-CM | POA: Diagnosis not present

## 2019-07-08 DIAGNOSIS — R69 Illness, unspecified: Secondary | ICD-10-CM | POA: Diagnosis not present

## 2019-07-23 DIAGNOSIS — F411 Generalized anxiety disorder: Secondary | ICD-10-CM | POA: Diagnosis not present

## 2019-07-23 DIAGNOSIS — R69 Illness, unspecified: Secondary | ICD-10-CM | POA: Diagnosis not present

## 2019-07-30 DIAGNOSIS — F411 Generalized anxiety disorder: Secondary | ICD-10-CM | POA: Diagnosis not present

## 2019-07-30 DIAGNOSIS — R69 Illness, unspecified: Secondary | ICD-10-CM | POA: Diagnosis not present

## 2019-08-06 DIAGNOSIS — R69 Illness, unspecified: Secondary | ICD-10-CM | POA: Diagnosis not present

## 2019-08-06 DIAGNOSIS — F411 Generalized anxiety disorder: Secondary | ICD-10-CM | POA: Diagnosis not present

## 2019-08-20 DIAGNOSIS — F411 Generalized anxiety disorder: Secondary | ICD-10-CM | POA: Diagnosis not present

## 2019-08-20 DIAGNOSIS — R69 Illness, unspecified: Secondary | ICD-10-CM | POA: Diagnosis not present

## 2019-08-27 DIAGNOSIS — R69 Illness, unspecified: Secondary | ICD-10-CM | POA: Diagnosis not present

## 2019-08-27 DIAGNOSIS — F411 Generalized anxiety disorder: Secondary | ICD-10-CM | POA: Diagnosis not present

## 2019-09-05 DIAGNOSIS — R69 Illness, unspecified: Secondary | ICD-10-CM | POA: Diagnosis not present

## 2019-09-05 DIAGNOSIS — F332 Major depressive disorder, recurrent severe without psychotic features: Secondary | ICD-10-CM | POA: Diagnosis not present

## 2019-09-05 DIAGNOSIS — F411 Generalized anxiety disorder: Secondary | ICD-10-CM | POA: Diagnosis not present

## 2019-09-10 DIAGNOSIS — R69 Illness, unspecified: Secondary | ICD-10-CM | POA: Diagnosis not present

## 2019-09-10 DIAGNOSIS — F411 Generalized anxiety disorder: Secondary | ICD-10-CM | POA: Diagnosis not present

## 2019-09-24 DIAGNOSIS — R69 Illness, unspecified: Secondary | ICD-10-CM | POA: Diagnosis not present

## 2019-09-24 DIAGNOSIS — F411 Generalized anxiety disorder: Secondary | ICD-10-CM | POA: Diagnosis not present

## 2019-10-01 DIAGNOSIS — F411 Generalized anxiety disorder: Secondary | ICD-10-CM | POA: Diagnosis not present

## 2019-10-01 DIAGNOSIS — R69 Illness, unspecified: Secondary | ICD-10-CM | POA: Diagnosis not present

## 2019-10-03 DIAGNOSIS — F411 Generalized anxiety disorder: Secondary | ICD-10-CM | POA: Diagnosis not present

## 2019-10-03 DIAGNOSIS — R69 Illness, unspecified: Secondary | ICD-10-CM | POA: Diagnosis not present

## 2019-10-03 DIAGNOSIS — F332 Major depressive disorder, recurrent severe without psychotic features: Secondary | ICD-10-CM | POA: Diagnosis not present

## 2019-10-08 DIAGNOSIS — F411 Generalized anxiety disorder: Secondary | ICD-10-CM | POA: Diagnosis not present

## 2019-10-08 DIAGNOSIS — R69 Illness, unspecified: Secondary | ICD-10-CM | POA: Diagnosis not present

## 2019-10-22 DIAGNOSIS — F411 Generalized anxiety disorder: Secondary | ICD-10-CM | POA: Diagnosis not present

## 2019-10-22 DIAGNOSIS — R69 Illness, unspecified: Secondary | ICD-10-CM | POA: Diagnosis not present

## 2019-10-28 DIAGNOSIS — F411 Generalized anxiety disorder: Secondary | ICD-10-CM | POA: Diagnosis not present

## 2019-10-28 DIAGNOSIS — F332 Major depressive disorder, recurrent severe without psychotic features: Secondary | ICD-10-CM | POA: Diagnosis not present

## 2019-10-28 DIAGNOSIS — R69 Illness, unspecified: Secondary | ICD-10-CM | POA: Diagnosis not present

## 2019-10-29 DIAGNOSIS — F411 Generalized anxiety disorder: Secondary | ICD-10-CM | POA: Diagnosis not present

## 2019-10-29 DIAGNOSIS — R69 Illness, unspecified: Secondary | ICD-10-CM | POA: Diagnosis not present

## 2019-11-04 ENCOUNTER — Encounter: Payer: Self-pay | Admitting: Podiatry

## 2019-11-04 ENCOUNTER — Other Ambulatory Visit: Payer: Self-pay

## 2019-11-04 ENCOUNTER — Ambulatory Visit: Payer: Medicare HMO | Admitting: Podiatry

## 2019-11-04 DIAGNOSIS — L600 Ingrowing nail: Secondary | ICD-10-CM

## 2019-11-04 MED ORDER — NEOMYCIN-POLYMYXIN-HC 3.5-10000-1 OT SOLN: OTIC | 0 refills | Status: DC

## 2019-11-04 NOTE — Patient Instructions (Signed)

## 2019-11-05 ENCOUNTER — Telehealth: Payer: Self-pay | Admitting: *Deleted

## 2019-11-05 DIAGNOSIS — F411 Generalized anxiety disorder: Secondary | ICD-10-CM | POA: Diagnosis not present

## 2019-11-05 DIAGNOSIS — R69 Illness, unspecified: Secondary | ICD-10-CM | POA: Diagnosis not present

## 2019-11-05 NOTE — Telephone Encounter (Signed)
Pt called states she had an ingrown toenail procedure yesterday and had a question about the soaks.

## 2019-11-05 NOTE — Telephone Encounter (Signed)
I spoke with pt, she states she read she was to soak twice a day, but did not get to soak today until after 4:00pm, would it be okay just to soak once today and begin twice daily tomorrow. I told pt that would be fine.

## 2019-11-06 ENCOUNTER — Telehealth: Payer: Self-pay | Admitting: Podiatry

## 2019-11-06 NOTE — Telephone Encounter (Signed)
I called pt and as I was living instructions pt called. Pt asked if she washed just the bottom of her foot since she was soaking the foot twice daily, then pt states she had a light pink discharge on the bandaid if that was normal. I told pt she should wash her foot as normally then perform the soaks after the shower and she may have drainage to varying degrees up to 3-6 weeks and could stop the soaks and drops once she had a dry hard scab.

## 2019-11-06 NOTE — Progress Notes (Signed)
Subjective:   Patient ID: Destiny Elliott, female   DOB: 68 y.o.   MRN: [id number]   HPI Patient presents stating she has chronic pain in the second nail of her right foot and it pinches and it makes it hard for her to wear shoe gear.  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  I explained procedure risk and patient wants surgery and today I infiltrated the right second digit 60 mg like Marcaine mixture sterile prep applied to the toe and using sterile instrumentation I remove the nail exposed matrix and applied phenol 3 applications 30 seconds followed by alcohol lavage sterile dressing.  Give instructions on soaks and reappoint

## 2019-11-06 NOTE — Telephone Encounter (Signed)
Pt had ingrown removed 12/21 and has some questions regarding toenail removal and the chemical used. Please give patient a call

## 2019-11-14 ENCOUNTER — Other Ambulatory Visit: Payer: Self-pay

## 2019-11-14 ENCOUNTER — Ambulatory Visit: Payer: Medicare HMO | Admitting: Podiatry

## 2019-11-14 DIAGNOSIS — L03031 Cellulitis of right toe: Secondary | ICD-10-CM

## 2019-11-18 NOTE — Progress Notes (Signed)
Subjective:   Patient ID: Destiny Elliott, female   DOB: 69 y.o.   MRN: [id number]   HPI Patient just wanted to get her toe checked after ingrown toenail surgery   ROS      Objective:  Physical Exam  Neurovascular status intact with toe which appears to be in a normal current position     Assessment:  No indications of issues with nailbed     Plan:  Finish soaking to all drainages and it and this should heal uneventfully

## 2019-11-19 DIAGNOSIS — F411 Generalized anxiety disorder: Secondary | ICD-10-CM | POA: Diagnosis not present

## 2019-11-19 DIAGNOSIS — R69 Illness, unspecified: Secondary | ICD-10-CM | POA: Diagnosis not present

## 2019-12-03 DIAGNOSIS — F332 Major depressive disorder, recurrent severe without psychotic features: Secondary | ICD-10-CM | POA: Diagnosis not present

## 2019-12-03 DIAGNOSIS — F411 Generalized anxiety disorder: Secondary | ICD-10-CM | POA: Diagnosis not present

## 2019-12-03 DIAGNOSIS — R69 Illness, unspecified: Secondary | ICD-10-CM | POA: Diagnosis not present

## 2019-12-10 DIAGNOSIS — F411 Generalized anxiety disorder: Secondary | ICD-10-CM | POA: Diagnosis not present

## 2019-12-10 DIAGNOSIS — R69 Illness, unspecified: Secondary | ICD-10-CM | POA: Diagnosis not present

## 2019-12-11 ENCOUNTER — Telehealth: Payer: Self-pay | Admitting: *Deleted

## 2019-12-11 ENCOUNTER — Encounter: Payer: Self-pay | Admitting: Podiatry

## 2019-12-11 NOTE — Telephone Encounter (Signed)
Pt states she had an ingrown toenail is still redder than the rest of the toes.

## 2019-12-11 NOTE — Telephone Encounter (Signed)
I called pt and she states the toe is red after wearing shoes and her shoes are loose. I told pt that after a toe surgery due to the way it is performed, by using a wrap to push the blood out of the toe, then a tourniquet is applied, once the tourniquet is released the blood rushes in and breaks the tiny blood vessels causing bruising and due to the tiny blood vessels it takes longer for the discoloration to dissipate.

## 2019-12-17 DIAGNOSIS — F411 Generalized anxiety disorder: Secondary | ICD-10-CM | POA: Diagnosis not present

## 2019-12-17 DIAGNOSIS — R69 Illness, unspecified: Secondary | ICD-10-CM | POA: Diagnosis not present

## 2019-12-24 DIAGNOSIS — F411 Generalized anxiety disorder: Secondary | ICD-10-CM | POA: Diagnosis not present

## 2019-12-24 DIAGNOSIS — R69 Illness, unspecified: Secondary | ICD-10-CM | POA: Diagnosis not present

## 2019-12-31 DIAGNOSIS — F332 Major depressive disorder, recurrent severe without psychotic features: Secondary | ICD-10-CM | POA: Diagnosis not present

## 2019-12-31 DIAGNOSIS — F411 Generalized anxiety disorder: Secondary | ICD-10-CM | POA: Diagnosis not present

## 2019-12-31 DIAGNOSIS — R69 Illness, unspecified: Secondary | ICD-10-CM | POA: Diagnosis not present

## 2020-01-02 ENCOUNTER — Ambulatory Visit: Payer: Medicare HMO | Admitting: Certified Nurse Midwife

## 2020-01-03 ENCOUNTER — Other Ambulatory Visit: Payer: Self-pay

## 2020-01-06 ENCOUNTER — Other Ambulatory Visit: Payer: Self-pay

## 2020-01-06 ENCOUNTER — Encounter: Payer: Self-pay | Admitting: Certified Nurse Midwife

## 2020-01-06 ENCOUNTER — Ambulatory Visit (INDEPENDENT_AMBULATORY_CARE_PROVIDER_SITE_OTHER): Payer: Medicare HMO | Admitting: Certified Nurse Midwife

## 2020-01-06 VITALS — BP 120/68 | HR 68 | Temp 97.9°F | Resp 16 | Ht 60.75 in | Wt 124.0 lb

## 2020-01-06 DIAGNOSIS — D229 Melanocytic nevi, unspecified: Secondary | ICD-10-CM

## 2020-01-06 DIAGNOSIS — Z780 Asymptomatic menopausal state: Secondary | ICD-10-CM

## 2020-01-06 DIAGNOSIS — Z8739 Personal history of other diseases of the musculoskeletal system and connective tissue: Secondary | ICD-10-CM

## 2020-01-06 DIAGNOSIS — Z01419 Encounter for gynecological examination (general) (routine) without abnormal findings: Secondary | ICD-10-CM

## 2020-01-06 NOTE — Progress Notes (Signed)
69 y.o. G58P2002 Married  Caucasian Fe here for annual exam. Menopausal no symptoms now. Denies vaginal dryness or discomfort with sexual activity. Trying to stay active and making sure diet is adequate in calcium. Sees Dr. Stephanie Acre for aex and labs. Sees Psychiatry weekly virtual conversations, and this is working well. Has not vaccinated with Covid at this point. Request information regarding. No other health issues today.  Patient's last menstrual period was 10/14/2002 (approximate).          Sexually active: Yes.    The current method of family planning is vasectomy.    Exercising: Yes.    walking Smoker:  no  Review of Systems  Constitutional: Negative.   HENT: Negative.   Eyes: Negative.   Respiratory: Negative.   Cardiovascular: Negative.   Gastrointestinal: Negative.   Genitourinary: Negative.   Musculoskeletal: Negative.   Skin: Negative.   Neurological: Negative.   Endo/Heme/Allergies: Negative.   Psychiatric/Behavioral: Negative.     Health Maintenance: Pap:  12-19-17 neg HPV HR neg History of Abnormal Pap: yes,LEEP MMG:  12-25-2018 category c density birads 1:neg Self Breast exams: yes Colonoscopy:  2015 hyperplastic polyp f/u 12yrs BMD:   2019 osteopenia TDaP:  2015 Shingles: none Pneumonia: none Hep C and HIV: both neg 2017 Labs: PCP   reports that she quit smoking about 15 years ago. Her smoking use included cigarettes. She has a 14.00 pack-year smoking history. She has never used smokeless tobacco. She reports current alcohol use. She reports that she does not use drugs.  Past Medical History:  Diagnosis Date  . Abnormal Pap smear 1994   --CIN I  . Allergic rhinitis   . Anxiety   . Dense breasts   . Endocervical polyp 01/1993   -benign  . Hypercholesterolemia   . Hypertension   . Osteopenia     Past Surgical History:  Procedure Laterality Date  . CERVICAL BIOPSY  W/ LOOP ELECTRODE EXCISION  03/1993   CIN I  . COLONOSCOPY  2016  . endocervical polyp   01-27-93   -benign  . TOOTH EXTRACTION  08/2017    Current Outpatient Medications  Medication Sig Dispense Refill  . ARIPiprazole (ABILIFY) 2 MG tablet Take 2 mg by mouth daily.    Marland Kitchen atenolol (TENORMIN) 50 MG tablet Take 50 mg by mouth daily.     . traZODone (DESYREL) 50 MG tablet Take 50 mg by mouth.     No current facility-administered medications for this visit.    Family History  Problem Relation Age of Onset  . Stroke Father 15  . Kidney disease Father   . Hypertension Father   . Hypertension Sister 46  . Hyperlipidemia Sister   . COPD Sister   . Breast cancer Sister 46  . Thyroid disease Mother 41  . Heart failure Mother   . Heart disease Mother   . Hypertension Mother   . COPD Mother   . Heart disease Brother 77       AFIB  . Suicidality Son 39       07/20/2013  . Thyroid disease Sister 27    ROS:  Pertinent items are noted in HPI.  Otherwise, a comprehensive ROS was negative.  Exam:   BP 120/68   Pulse 68   Temp 97.9 F (36.6 C) (Skin)   Resp 16   Ht 5' 0.75" (1.543 m)   Wt 124 lb (56.2 kg)   LMP 10/14/2002 (Approximate)   BMI 23.62 kg/m  Height: 5' 0.75" (154.3  cm) Ht Readings from Last 3 Encounters:  01/06/20 5' 0.75" (1.543 m)  12/28/18 5\' 1"  (1.549 m)  12/19/17 5\' 1"  (1.549 m)    General appearance: alert, cooperative and appears stated age Head: Normocephalic, without obvious abnormality, atraumatic Neck: no adenopathy, supple, symmetrical, trachea midline and thyroid normal to inspection and palpation Lungs: clear to auscultation bilaterally Breasts: normal appearance, no masses or tenderness, No nipple retraction or dimpling, No nipple discharge or bleeding, No axillary or supraclavicular adenopathy Heart: regular rate and rhythm Abdomen: soft, non-tender; no masses,  no organomegaly Extremities: extremities normal, atraumatic, no cyanosis or edema Skin: Skin color, texture, turgor normal. No rashes or lesions, small mole noted on left side  rib cage area Lymph nodes: Cervical, supraclavicular, and axillary nodes normal. No abnormal inguinal nodes palpated Neurologic: Grossly normal   Pelvic: External genitalia:  no lesions              Urethra:  normal appearing urethra with no masses, tenderness or lesions              Bartholin's and Skene's: normal                 Vagina: normal appearing vagina with normal color and discharge, no lesions              Cervix: no cervical motion tenderness and no lesions              Pap taken: No. Bimanual Exam:  Uterus:  normal size, contour, position, consistency, mobility, non-tender and anteverted              Adnexa: normal adnexa and no mass, fullness, tenderness               Rectovaginal: Confirms               Anus:  normal sphincter tone, no lesions  Chaperone present: yes  A:  Well Woman with normal exam  Post menopausal no HRT  Hypertension with PCP management  Depression with Psychiatry management  Osteopenia, BMD due with mammogram this year  New mole patient noted    P:   Reviewed health and wellness pertinent to exam  Aware of need to advise if vaginal bleeding.  Continue follow up with MD's as indicated with other health issues.  Discussed importance of Osteopenia follow up with BMD, may can do with mammogram, order placed  Discussed mole finding and need to see Dermatology to assess. Given information to schedule.   Pap smear: no   counseled on breast self exam, mammography screening, osteoporosis, adequate intake of calcium and vitamin D, diet and exercise. Given information to obtain Covid vaccine.  return annually or prn  An After Visit Summary was printed and given to the patient.

## 2020-01-06 NOTE — Patient Instructions (Signed)
EXERCISE AND DIET:  We recommended that you start or continue a regular exercise program for good health. Regular exercise means any activity that makes your heart beat faster and makes you sweat.  We recommend exercising at least 30 minutes per day at least 3 days a week, preferably 4 or 5.  We also recommend a diet low in fat and sugar.  Inactivity, poor dietary choices and obesity can cause diabetes, heart attack, stroke, and kidney damage, among others.   ° °ALCOHOL AND SMOKING:  Women should limit their alcohol intake to no more than 7 drinks/beers/glasses of wine (combined, not each!) per week. Moderation of alcohol intake to this level decreases your risk of breast cancer and liver damage. And of course, no recreational drugs are part of a healthy lifestyle.  And absolutely no smoking or even second hand smoke. Most people know smoking can cause heart and lung diseases, but did you know it also contributes to weakening of your bones? Aging of your skin?  Yellowing of your teeth and nails? ° °CALCIUM AND VITAMIN D:  Adequate intake of calcium and Vitamin D are recommended.  The recommendations for exact amounts of these supplements seem to change often, but generally speaking 600 mg of calcium (either carbonate or citrate) and 800 units of Vitamin D per day seems prudent. Certain women may benefit from higher intake of Vitamin D.  If you are among these women, your doctor will have told you during your visit.   ° °PAP SMEARS:  Pap smears, to check for cervical cancer or precancers,  have traditionally been done yearly, although recent scientific advances have shown that most women can have pap smears less often.  However, every woman still should have a physical exam from her gynecologist every year. It will include a breast check, inspection of the vulva and vagina to check for abnormal growths or skin changes, a visual exam of the cervix, and then an exam to evaluate the size and shape of the uterus and  ovaries.  And after 69 years of age, a rectal exam is indicated to check for rectal cancers. We will also provide age appropriate advice regarding health maintenance, like when you should have certain vaccines, screening for sexually transmitted diseases, bone density testing, colonoscopy, mammograms, etc.  ° °MAMMOGRAMS:  All women over 40 years old should have a yearly mammogram. Many facilities now offer a "3D" mammogram, which may cost around $50 extra out of pocket. If possible,  we recommend you accept the option to have the 3D mammogram performed.  It both reduces the number of women who will be called back for extra views which then turn out to be normal, and it is better than the routine mammogram at detecting truly abnormal areas.   ° °COLONOSCOPY:  Colonoscopy to screen for colon cancer is recommended for all women at age 50.  We know, you hate the idea of the prep.  We agree, BUT, having colon cancer and not knowing it is worse!!  Colon cancer so often starts as a polyp that can be seen and removed at colonscopy, which can quite literally save your life!  And if your first colonoscopy is normal and you have no family history of colon cancer, most women don't have to have it again for 10 years.  Once every ten years, you can do something that may end up saving your life, right?  We will be happy to help you get it scheduled when you are ready.    Be sure to check your insurance coverage so you understand how much it will cost.  It may be covered as a preventative service at no cost, but you should check your particular policy.      Mole A mole is a colored (pigmented) growth on the skin. Moles are very common. They are usually harmless, but some moles can become cancerous over time. What are the causes? Moles are caused when pigmented skin cells grow together in clusters instead of spreading out in the skin as they normally do. The reason why the skin cells grow together in clusters is not  known. What increases the risk? You are more likely to develop a mole if you:  Have family members who have moles.  Are white.  Have blond hair.  Are often outdoors and exposed to the sun.  Received phototherapy when you were a newborn baby.  Are female. What are the signs or symptoms? A mole may be:  Owens Shark or black.  Flat or raised.  Smooth or wrinkled. How is this diagnosed? A mole is diagnosed with a skin exam. If your health care provider thinks a mole may be cancerous, all or part of the mole will be removed for testing (biopsy). How is this treated? Most moles are noncancerous (benign) and do not require treatment. If a mole is found to be cancerous, it will be removed. You may also choose to have a mole removed if it is causing pain or if you do not like the way it looks. Follow these instructions at home: General instructions   Every month, look for new moles and check your existing moles for changes. This is important because a change in a mole can mean that the mole has become cancerous.  ABCDE changes in a mole indicate that you should be evaluated by your health care provider. ABCDE stands for: ? Asymmetry. This means the mole has an irregular shape. It is not round or oval. ? Border. This means the mole has an irregular or bumpy border. ? Color. This means the mole has multiple colors in it, including brown, black, blue, red, or tan. Note that it is normal for moles to get darker when a woman is pregnant or takes birth control pills. ? Diameter. This means the mole is more than 0.2 inches (6 mm) across. ? Evolving. This refers to any unusual changes or symptoms in the mole, such as pain, itching, stinging, sensitivity, or bleeding.  If you have a large number of moles, see a skin doctor (dermatologist) at least one time every year for a full-body skin check. Lifestyle   When you are outdoors, wear sunscreen with SPF 30 (sun protection factor 30) or  higher.  Use an adequate amount of sunscreen to cover exposed areas of skin. Put it on 30 minutes before you go out. Reapply it every 2 hours or anytime you come out of the water.  When you are out in the sun, wear a broad-brimmed hat and clothing that covers your arms and legs. Wear wraparound sunglasses. Contact a health care provider if:  The size, shape, borders, or color of your mole changes.  Your mole, or the skin near the mole, becomes painful, sore, red, or swollen.  Your mole: ? Develops more than one color. ? Itches or bleeds. ? Becomes scaly, sheds skin, or oozes fluid. ? Becomes flat or develops raised areas. ? Becomes hard or soft.  You develop a new mole. Summary  A mole is  a colored (pigmented) growth on the skin. Moles are very common. They are usually harmless, but some moles can become cancerous over time.  Every month, look for new moles and check your existing moles for changes. This is important because a change in a mole can mean that the mole has become cancerous.  If you have a large number of moles, see a skin doctor (dermatologist) at least one time every year for a full-body skin check.  When you are outdoors, wear sunscreen with SPF 30 (sun protection factor 30) or higher. Reapply it every 2 hours or anytime you come out of the water.  Contact a health care provider if you notice changes in a mole or if you develop a new mole. This information is not intended to replace advice given to you by your health care provider. Make sure you discuss any questions you have with your health care provider. Document Revised: 06/06/2019 Document Reviewed: 03/27/2018 Elsevier Patient Education  [place].

## 2020-01-07 DIAGNOSIS — F411 Generalized anxiety disorder: Secondary | ICD-10-CM | POA: Diagnosis not present

## 2020-01-07 DIAGNOSIS — R69 Illness, unspecified: Secondary | ICD-10-CM | POA: Diagnosis not present

## 2020-01-08 ENCOUNTER — Telehealth: Payer: Self-pay | Admitting: *Deleted

## 2020-01-08 NOTE — Telephone Encounter (Signed)
Patient called and stated that she had a toenail procedure back on 11/04/2019 with Dr Paulla Dolly and patient stated that there is still some draining and the toe does get red and stays red until the next day and the shoes that she wears are comfortable and patient has discontinued the soaks and the ointment and I got the patient in on February 25th at 1:15 pm to see Dr Paulla Dolly. Lattie Haw

## 2020-01-09 ENCOUNTER — Other Ambulatory Visit: Payer: Self-pay

## 2020-01-09 ENCOUNTER — Ambulatory Visit: Payer: Medicare HMO | Admitting: Podiatry

## 2020-01-09 ENCOUNTER — Encounter: Payer: Self-pay | Admitting: Podiatry

## 2020-01-09 VITALS — Temp 98.0°F

## 2020-01-09 DIAGNOSIS — L03031 Cellulitis of right toe: Secondary | ICD-10-CM

## 2020-01-09 DIAGNOSIS — L600 Ingrowing nail: Secondary | ICD-10-CM

## 2020-01-10 NOTE — Progress Notes (Signed)
Subjective:   Patient ID: Destiny Elliott, female   DOB: 69 y.o.   MRN: [id number]   HPI Patient presents with caregiver concerned about a nail that was removed several months ago with a crusted tissue and feeling she may have drainage and there is redness   ROS      Objective:  Physical Exam  Neurovascular status intact with patient found to have crusted nailbed second digit right that is mildly painful localized in nature with no current drainage noted and no current erythema of the digit with no proximal spread noted     Assessment:  Probability for scab formation with no indications currently of infective process     Plan:  H&P debrided the tissue and found there to be no drainage underneath it and advised on return to normal activity and encouraged her to contact me if any issues were to occur in the future

## 2020-01-13 DIAGNOSIS — R69 Illness, unspecified: Secondary | ICD-10-CM | POA: Diagnosis not present

## 2020-01-13 DIAGNOSIS — F411 Generalized anxiety disorder: Secondary | ICD-10-CM | POA: Diagnosis not present

## 2020-01-16 ENCOUNTER — Ambulatory Visit: Payer: Medicare HMO | Admitting: Podiatry

## 2020-01-20 ENCOUNTER — Other Ambulatory Visit: Payer: Self-pay | Admitting: Certified Nurse Midwife

## 2020-01-20 DIAGNOSIS — Z8739 Personal history of other diseases of the musculoskeletal system and connective tissue: Secondary | ICD-10-CM

## 2020-01-20 DIAGNOSIS — Z1231 Encounter for screening mammogram for malignant neoplasm of breast: Secondary | ICD-10-CM

## 2020-01-21 DIAGNOSIS — R69 Illness, unspecified: Secondary | ICD-10-CM | POA: Diagnosis not present

## 2020-01-21 DIAGNOSIS — F411 Generalized anxiety disorder: Secondary | ICD-10-CM | POA: Diagnosis not present

## 2020-01-28 DIAGNOSIS — R69 Illness, unspecified: Secondary | ICD-10-CM | POA: Diagnosis not present

## 2020-01-28 DIAGNOSIS — F411 Generalized anxiety disorder: Secondary | ICD-10-CM | POA: Diagnosis not present

## 2020-01-29 DIAGNOSIS — F332 Major depressive disorder, recurrent severe without psychotic features: Secondary | ICD-10-CM | POA: Diagnosis not present

## 2020-01-29 DIAGNOSIS — F411 Generalized anxiety disorder: Secondary | ICD-10-CM | POA: Diagnosis not present

## 2020-01-29 DIAGNOSIS — R69 Illness, unspecified: Secondary | ICD-10-CM | POA: Diagnosis not present

## 2020-01-30 DIAGNOSIS — J3489 Other specified disorders of nose and nasal sinuses: Secondary | ICD-10-CM | POA: Diagnosis not present

## 2020-02-04 ENCOUNTER — Encounter: Payer: Self-pay | Admitting: Certified Nurse Midwife

## 2020-02-04 DIAGNOSIS — R69 Illness, unspecified: Secondary | ICD-10-CM | POA: Diagnosis not present

## 2020-02-04 DIAGNOSIS — F411 Generalized anxiety disorder: Secondary | ICD-10-CM | POA: Diagnosis not present

## 2020-02-17 DIAGNOSIS — H00012 Hordeolum externum right lower eyelid: Secondary | ICD-10-CM | POA: Diagnosis not present

## 2020-02-20 DIAGNOSIS — R69 Illness, unspecified: Secondary | ICD-10-CM | POA: Diagnosis not present

## 2020-02-20 DIAGNOSIS — F332 Major depressive disorder, recurrent severe without psychotic features: Secondary | ICD-10-CM | POA: Diagnosis not present

## 2020-02-20 DIAGNOSIS — F411 Generalized anxiety disorder: Secondary | ICD-10-CM | POA: Diagnosis not present

## 2020-03-04 DIAGNOSIS — R69 Illness, unspecified: Secondary | ICD-10-CM | POA: Diagnosis not present

## 2020-03-04 DIAGNOSIS — F411 Generalized anxiety disorder: Secondary | ICD-10-CM | POA: Diagnosis not present

## 2020-03-18 DIAGNOSIS — R69 Illness, unspecified: Secondary | ICD-10-CM | POA: Diagnosis not present

## 2020-03-18 DIAGNOSIS — F411 Generalized anxiety disorder: Secondary | ICD-10-CM | POA: Diagnosis not present

## 2020-03-19 ENCOUNTER — Ambulatory Visit: Payer: Medicare HMO

## 2020-03-19 ENCOUNTER — Other Ambulatory Visit: Payer: Medicare HMO

## 2020-03-20 DIAGNOSIS — M542 Cervicalgia: Secondary | ICD-10-CM | POA: Diagnosis not present

## 2020-03-20 DIAGNOSIS — M79602 Pain in left arm: Secondary | ICD-10-CM | POA: Diagnosis not present

## 2020-03-23 DIAGNOSIS — F411 Generalized anxiety disorder: Secondary | ICD-10-CM | POA: Diagnosis not present

## 2020-03-23 DIAGNOSIS — R69 Illness, unspecified: Secondary | ICD-10-CM | POA: Diagnosis not present

## 2020-03-23 DIAGNOSIS — F332 Major depressive disorder, recurrent severe without psychotic features: Secondary | ICD-10-CM | POA: Diagnosis not present

## 2020-03-24 DIAGNOSIS — F411 Generalized anxiety disorder: Secondary | ICD-10-CM | POA: Diagnosis not present

## 2020-03-24 DIAGNOSIS — R69 Illness, unspecified: Secondary | ICD-10-CM | POA: Diagnosis not present

## 2020-03-25 DIAGNOSIS — H00013 Hordeolum externum right eye, unspecified eyelid: Secondary | ICD-10-CM | POA: Diagnosis not present

## 2020-03-25 DIAGNOSIS — I10 Essential (primary) hypertension: Secondary | ICD-10-CM | POA: Diagnosis not present

## 2020-03-31 DIAGNOSIS — F411 Generalized anxiety disorder: Secondary | ICD-10-CM | POA: Diagnosis not present

## 2020-03-31 DIAGNOSIS — R69 Illness, unspecified: Secondary | ICD-10-CM | POA: Diagnosis not present

## 2020-04-16 DIAGNOSIS — R69 Illness, unspecified: Secondary | ICD-10-CM | POA: Diagnosis not present

## 2020-04-16 DIAGNOSIS — F411 Generalized anxiety disorder: Secondary | ICD-10-CM | POA: Diagnosis not present

## 2020-04-21 DIAGNOSIS — F332 Major depressive disorder, recurrent severe without psychotic features: Secondary | ICD-10-CM | POA: Diagnosis not present

## 2020-04-21 DIAGNOSIS — R69 Illness, unspecified: Secondary | ICD-10-CM | POA: Diagnosis not present

## 2020-04-21 DIAGNOSIS — F411 Generalized anxiety disorder: Secondary | ICD-10-CM | POA: Diagnosis not present

## 2020-04-28 DIAGNOSIS — F411 Generalized anxiety disorder: Secondary | ICD-10-CM | POA: Diagnosis not present

## 2020-04-28 DIAGNOSIS — R69 Illness, unspecified: Secondary | ICD-10-CM | POA: Diagnosis not present

## 2020-05-13 DIAGNOSIS — F411 Generalized anxiety disorder: Secondary | ICD-10-CM | POA: Diagnosis not present

## 2020-05-13 DIAGNOSIS — R69 Illness, unspecified: Secondary | ICD-10-CM | POA: Diagnosis not present

## 2020-05-19 DIAGNOSIS — F332 Major depressive disorder, recurrent severe without psychotic features: Secondary | ICD-10-CM | POA: Diagnosis not present

## 2020-05-19 DIAGNOSIS — R69 Illness, unspecified: Secondary | ICD-10-CM | POA: Diagnosis not present

## 2020-05-19 DIAGNOSIS — F411 Generalized anxiety disorder: Secondary | ICD-10-CM | POA: Diagnosis not present

## 2020-05-26 DIAGNOSIS — R69 Illness, unspecified: Secondary | ICD-10-CM | POA: Diagnosis not present

## 2020-05-26 DIAGNOSIS — F411 Generalized anxiety disorder: Secondary | ICD-10-CM | POA: Diagnosis not present

## 2020-05-28 DIAGNOSIS — R69 Illness, unspecified: Secondary | ICD-10-CM | POA: Diagnosis not present

## 2020-05-28 DIAGNOSIS — F332 Major depressive disorder, recurrent severe without psychotic features: Secondary | ICD-10-CM | POA: Diagnosis not present

## 2020-05-28 DIAGNOSIS — F411 Generalized anxiety disorder: Secondary | ICD-10-CM | POA: Diagnosis not present

## 2020-05-29 ENCOUNTER — Ambulatory Visit: Payer: Medicare HMO

## 2020-05-29 ENCOUNTER — Other Ambulatory Visit: Payer: Medicare HMO

## 2020-06-02 DIAGNOSIS — R69 Illness, unspecified: Secondary | ICD-10-CM | POA: Diagnosis not present

## 2020-06-02 DIAGNOSIS — F411 Generalized anxiety disorder: Secondary | ICD-10-CM | POA: Diagnosis not present

## 2020-06-09 DIAGNOSIS — R69 Illness, unspecified: Secondary | ICD-10-CM | POA: Diagnosis not present

## 2020-06-09 DIAGNOSIS — F411 Generalized anxiety disorder: Secondary | ICD-10-CM | POA: Diagnosis not present

## 2020-06-12 ENCOUNTER — Ambulatory Visit
Admission: RE | Admit: 2020-06-12 | Discharge: 2020-06-12 | Disposition: A | Discharge status: Home / Self Care | Payer: Medicare HMO | Source: Ambulatory Visit | Attending: Certified Nurse Midwife | Admitting: Certified Nurse Midwife

## 2020-06-12 ENCOUNTER — Other Ambulatory Visit: Payer: Self-pay

## 2020-06-12 DIAGNOSIS — Z1231 Encounter for screening mammogram for malignant neoplasm of breast: Secondary | ICD-10-CM | POA: Diagnosis not present

## 2020-06-12 DIAGNOSIS — Z8739 Personal history of other diseases of the musculoskeletal system and connective tissue: Secondary | ICD-10-CM

## 2020-06-12 DIAGNOSIS — M8589 Other specified disorders of bone density and structure, multiple sites: Secondary | ICD-10-CM | POA: Diagnosis not present

## 2020-06-12 DIAGNOSIS — Z780 Asymptomatic menopausal state: Secondary | ICD-10-CM | POA: Diagnosis not present

## 2020-06-15 DIAGNOSIS — F411 Generalized anxiety disorder: Secondary | ICD-10-CM | POA: Diagnosis not present

## 2020-06-15 DIAGNOSIS — F332 Major depressive disorder, recurrent severe without psychotic features: Secondary | ICD-10-CM | POA: Diagnosis not present

## 2020-06-15 DIAGNOSIS — R69 Illness, unspecified: Secondary | ICD-10-CM | POA: Diagnosis not present

## 2020-06-16 ENCOUNTER — Telehealth: Payer: Self-pay | Admitting: Obstetrics and Gynecology

## 2020-06-16 DIAGNOSIS — F411 Generalized anxiety disorder: Secondary | ICD-10-CM | POA: Diagnosis not present

## 2020-06-16 DIAGNOSIS — R69 Illness, unspecified: Secondary | ICD-10-CM | POA: Diagnosis not present

## 2020-06-16 NOTE — Telephone Encounter (Signed)
Patient would like results of mammogram and bone density.

## 2020-06-16 NOTE — Telephone Encounter (Signed)
Spoke with pt. Pt given results of BMD per Dr Quincy Simmonds. Pt states cannot see Mychart messages at this time. Pt wanting to know MMG results as well. Reviewed chart, pt advised no MMG results at this time. Will review with Dr Quincy Simmonds and return call. Pt agreeable.   Routing to Dr Quincy Simmonds.   Pt had BMD/MMG on 06/12/20.

## 2020-06-16 NOTE — Telephone Encounter (Signed)
BMD showed osteopenia.   No tx needed.   Needs next BMD in 2 years.   Mammogram not finalized yet.

## 2020-06-17 NOTE — Telephone Encounter (Signed)
Call placed to pt. MMG results received today with negative results and to follow up in 1 year. Pt verbalized understanding. Thankful for call.   Routing to Dr Quincy Simmonds for review and update given on negative MMG from 06/12/20. Encounter closed.

## 2020-06-23 DIAGNOSIS — R69 Illness, unspecified: Secondary | ICD-10-CM | POA: Diagnosis not present

## 2020-06-23 DIAGNOSIS — F411 Generalized anxiety disorder: Secondary | ICD-10-CM | POA: Diagnosis not present

## 2020-06-26 DIAGNOSIS — H2513 Age-related nuclear cataract, bilateral: Secondary | ICD-10-CM | POA: Diagnosis not present

## 2020-06-26 DIAGNOSIS — H35363 Drusen (degenerative) of macula, bilateral: Secondary | ICD-10-CM | POA: Diagnosis not present

## 2020-06-26 DIAGNOSIS — H43813 Vitreous degeneration, bilateral: Secondary | ICD-10-CM | POA: Diagnosis not present

## 2020-06-26 DIAGNOSIS — H524 Presbyopia: Secondary | ICD-10-CM | POA: Diagnosis not present

## 2020-06-26 DIAGNOSIS — H0288A Meibomian gland dysfunction right eye, upper and lower eyelids: Secondary | ICD-10-CM | POA: Diagnosis not present

## 2020-06-29 ENCOUNTER — Other Ambulatory Visit: Payer: Self-pay | Admitting: Obstetrics and Gynecology

## 2020-06-29 DIAGNOSIS — Z1231 Encounter for screening mammogram for malignant neoplasm of breast: Secondary | ICD-10-CM

## 2020-07-07 DIAGNOSIS — R350 Frequency of micturition: Secondary | ICD-10-CM | POA: Diagnosis not present

## 2020-07-07 DIAGNOSIS — J309 Allergic rhinitis, unspecified: Secondary | ICD-10-CM | POA: Diagnosis not present

## 2020-07-07 DIAGNOSIS — M8580 Other specified disorders of bone density and structure, unspecified site: Secondary | ICD-10-CM | POA: Diagnosis not present

## 2020-07-07 DIAGNOSIS — E559 Vitamin D deficiency, unspecified: Secondary | ICD-10-CM | POA: Diagnosis not present

## 2020-07-07 DIAGNOSIS — Z0000 Encounter for general adult medical examination without abnormal findings: Secondary | ICD-10-CM | POA: Diagnosis not present

## 2020-07-07 DIAGNOSIS — I10 Essential (primary) hypertension: Secondary | ICD-10-CM | POA: Diagnosis not present

## 2020-07-07 DIAGNOSIS — F411 Generalized anxiety disorder: Secondary | ICD-10-CM | POA: Diagnosis not present

## 2020-07-07 DIAGNOSIS — Z79899 Other long term (current) drug therapy: Secondary | ICD-10-CM | POA: Diagnosis not present

## 2020-07-07 DIAGNOSIS — Z1159 Encounter for screening for other viral diseases: Secondary | ICD-10-CM | POA: Diagnosis not present

## 2020-07-07 DIAGNOSIS — R69 Illness, unspecified: Secondary | ICD-10-CM | POA: Diagnosis not present

## 2020-07-14 DIAGNOSIS — R69 Illness, unspecified: Secondary | ICD-10-CM | POA: Diagnosis not present

## 2020-07-14 DIAGNOSIS — F411 Generalized anxiety disorder: Secondary | ICD-10-CM | POA: Diagnosis not present

## 2020-07-16 DIAGNOSIS — R69 Illness, unspecified: Secondary | ICD-10-CM | POA: Diagnosis not present

## 2020-07-16 DIAGNOSIS — F332 Major depressive disorder, recurrent severe without psychotic features: Secondary | ICD-10-CM | POA: Diagnosis not present

## 2020-07-16 DIAGNOSIS — F411 Generalized anxiety disorder: Secondary | ICD-10-CM | POA: Diagnosis not present

## 2020-07-22 DIAGNOSIS — R69 Illness, unspecified: Secondary | ICD-10-CM | POA: Diagnosis not present

## 2020-07-22 DIAGNOSIS — F411 Generalized anxiety disorder: Secondary | ICD-10-CM | POA: Diagnosis not present

## 2020-07-28 DIAGNOSIS — F411 Generalized anxiety disorder: Secondary | ICD-10-CM | POA: Diagnosis not present

## 2020-07-28 DIAGNOSIS — R69 Illness, unspecified: Secondary | ICD-10-CM | POA: Diagnosis not present

## 2020-08-04 DIAGNOSIS — R69 Illness, unspecified: Secondary | ICD-10-CM | POA: Diagnosis not present

## 2020-08-04 DIAGNOSIS — F411 Generalized anxiety disorder: Secondary | ICD-10-CM | POA: Diagnosis not present

## 2020-08-11 DIAGNOSIS — F411 Generalized anxiety disorder: Secondary | ICD-10-CM | POA: Diagnosis not present

## 2020-08-11 DIAGNOSIS — R69 Illness, unspecified: Secondary | ICD-10-CM | POA: Diagnosis not present

## 2020-08-12 DIAGNOSIS — Z0100 Encounter for examination of eyes and vision without abnormal findings: Secondary | ICD-10-CM | POA: Diagnosis not present

## 2020-08-14 DIAGNOSIS — R69 Illness, unspecified: Secondary | ICD-10-CM | POA: Diagnosis not present

## 2020-08-14 DIAGNOSIS — F332 Major depressive disorder, recurrent severe without psychotic features: Secondary | ICD-10-CM | POA: Diagnosis not present

## 2020-08-14 DIAGNOSIS — F411 Generalized anxiety disorder: Secondary | ICD-10-CM | POA: Diagnosis not present

## 2020-08-25 DIAGNOSIS — F411 Generalized anxiety disorder: Secondary | ICD-10-CM | POA: Diagnosis not present

## 2020-08-25 DIAGNOSIS — R69 Illness, unspecified: Secondary | ICD-10-CM | POA: Diagnosis not present

## 2020-09-01 DIAGNOSIS — F411 Generalized anxiety disorder: Secondary | ICD-10-CM | POA: Diagnosis not present

## 2020-09-01 DIAGNOSIS — R69 Illness, unspecified: Secondary | ICD-10-CM | POA: Diagnosis not present

## 2020-09-08 DIAGNOSIS — F411 Generalized anxiety disorder: Secondary | ICD-10-CM | POA: Diagnosis not present

## 2020-09-08 DIAGNOSIS — R69 Illness, unspecified: Secondary | ICD-10-CM | POA: Diagnosis not present

## 2020-09-15 DIAGNOSIS — F411 Generalized anxiety disorder: Secondary | ICD-10-CM | POA: Diagnosis not present

## 2020-09-15 DIAGNOSIS — R69 Illness, unspecified: Secondary | ICD-10-CM | POA: Diagnosis not present

## 2020-09-18 DIAGNOSIS — F411 Generalized anxiety disorder: Secondary | ICD-10-CM | POA: Diagnosis not present

## 2020-09-18 DIAGNOSIS — R69 Illness, unspecified: Secondary | ICD-10-CM | POA: Diagnosis not present

## 2020-09-18 DIAGNOSIS — F332 Major depressive disorder, recurrent severe without psychotic features: Secondary | ICD-10-CM | POA: Diagnosis not present

## 2020-09-22 DIAGNOSIS — F411 Generalized anxiety disorder: Secondary | ICD-10-CM | POA: Diagnosis not present

## 2020-09-22 DIAGNOSIS — R69 Illness, unspecified: Secondary | ICD-10-CM | POA: Diagnosis not present

## 2020-09-29 DIAGNOSIS — R69 Illness, unspecified: Secondary | ICD-10-CM | POA: Diagnosis not present

## 2020-09-29 DIAGNOSIS — F411 Generalized anxiety disorder: Secondary | ICD-10-CM | POA: Diagnosis not present

## 2020-10-06 ENCOUNTER — Telehealth: Payer: Self-pay | Admitting: Podiatry

## 2020-10-06 DIAGNOSIS — R69 Illness, unspecified: Secondary | ICD-10-CM | POA: Diagnosis not present

## 2020-10-06 DIAGNOSIS — F411 Generalized anxiety disorder: Secondary | ICD-10-CM | POA: Diagnosis not present

## 2020-10-06 NOTE — Telephone Encounter (Signed)
Pt has been exposed to someone with covid but she has a swollen/ left 4th toe. She wants to know what to do until she can come into the office

## 2020-10-06 NOTE — Telephone Encounter (Signed)
ibuprophen

## 2020-10-07 ENCOUNTER — Other Ambulatory Visit (HOSPITAL_COMMUNITY): Payer: Self-pay | Admitting: Family

## 2020-10-07 ENCOUNTER — Ambulatory Visit: Payer: Medicare HMO | Admitting: Podiatry

## 2020-10-07 DIAGNOSIS — U071 COVID-19: Secondary | ICD-10-CM | POA: Diagnosis not present

## 2020-10-07 NOTE — Progress Notes (Signed)
I connected by phone with Destiny Elliott on 10/07/2020 at 6:28 PM to discuss the potential use of a new treatment for mild to moderate COVID-19 viral infection in non-hospitalized patients.  This patient is a 69 y.o. female that meets the FDA criteria for Emergency Use Authorization of COVID monoclonal antibody casirivimab/imdevimab, bamlanivimab/eteseviamb, or sotrovimab.  Lives in the home with a person who has a (+) direct SARS-CoV-2 viral test result  Has mild or moderate COVID-19   Is NOT hospitalized due to COVID-19  Is within 10 days of symptom onset  Has at least one of the high risk factor(s) for progression to severe COVID-19 and/or hospitalization as defined in EUA.  Specific high risk criteria : Older age (>/= 69 yo) and Cardiovascular disease or hypertension   Symptoms of cough, sneezing, H/A began 10/03/20.   I have spoken and communicated the following to the patient or parent/caregiver regarding COVID monoclonal antibody treatment:  1. FDA has authorized the emergency use for the treatment of mild to moderate COVID-19 in adults and pediatric patients with positive results of direct SARS-CoV-2 viral testing who are 36 years of age and older weighing at least 40 kg, and who are at high risk for progressing to severe COVID-19 and/or hospitalization.  2. The significant known and potential risks and benefits of COVID monoclonal antibody, and the extent to which such potential risks and benefits are unknown.  3. Information on available alternative treatments and the risks and benefits of those alternatives, including clinical trials.  4. Patients treated with COVID monoclonal antibody should continue to self-isolate and use infection control measures (e.g., wear mask, isolate, social distance, avoid sharing personal items, clean and disinfect "high touch" surfaces, and frequent handwashing) according to CDC guidelines.   5. The patient or parent/caregiver has the option to  accept or refuse COVID monoclonal antibody treatment.  After reviewing this information with the patient, the patient has agreed to receive one of the available covid 19 monoclonal antibodies and will be provided an appropriate fact sheet prior to infusion. Asencion Gowda, NP 10/07/2020 6:28 PM

## 2020-10-09 ENCOUNTER — Ambulatory Visit (HOSPITAL_COMMUNITY)
Admission: RE | Admit: 2020-10-09 | Discharge: 2020-10-09 | Disposition: A | Discharge status: Home / Self Care | Payer: Medicare HMO | Source: Ambulatory Visit | Attending: Pulmonary Disease | Admitting: Pulmonary Disease

## 2020-10-09 ENCOUNTER — Other Ambulatory Visit (HOSPITAL_COMMUNITY): Payer: Self-pay

## 2020-10-09 DIAGNOSIS — U071 COVID-19: Secondary | ICD-10-CM | POA: Diagnosis present

## 2020-10-09 DIAGNOSIS — Z23 Encounter for immunization: Secondary | ICD-10-CM | POA: Diagnosis not present

## 2020-10-09 MED ORDER — DIPHENHYDRAMINE HCL 50 MG/ML IJ SOLN: 50.0000 mg | Freq: Once | INTRAMUSCULAR | Status: DC | PRN

## 2020-10-09 MED ORDER — ALBUTEROL SULFATE HFA 108 (90 BASE) MCG/ACT IN AERS: 2.0000 | INHALATION_SPRAY | Freq: Once | RESPIRATORY_TRACT | Status: DC | PRN

## 2020-10-09 MED ORDER — FAMOTIDINE IN NACL 20-0.9 MG/50ML-% IV SOLN: 20.0000 mg | Freq: Once | INTRAVENOUS | Status: DC | PRN

## 2020-10-09 MED ORDER — SODIUM CHLORIDE 0.9 % IV SOLN: INTRAVENOUS | Status: DC | PRN

## 2020-10-09 MED ORDER — SOTROVIMAB 500 MG/8ML IV SOLN
500.0000 mg | Freq: Once | INTRAVENOUS | Status: AC
  Administered 2020-10-09: 500 mg via INTRAVENOUS

## 2020-10-09 MED ORDER — METHYLPREDNISOLONE SODIUM SUCC 125 MG IJ SOLR: 125.0000 mg | Freq: Once | INTRAMUSCULAR | Status: DC | PRN

## 2020-10-09 MED ORDER — EPINEPHRINE 0.3 MG/0.3ML IJ SOAJ: 0.3000 mg | Freq: Once | INTRAMUSCULAR | Status: DC | PRN

## 2020-10-09 NOTE — Discharge Instructions (Signed)

## 2020-10-09 NOTE — Progress Notes (Signed)
Patient reviewed Fact Sheet for Patients, Parents, and Caregivers for Emergency Use Authorization (EUA) of Sotrovimab for the Treatment of Coronavirus. Patient also reviewed and is agreeable to the estimated cost of treatment. Patient is agreeable to proceed.   

## 2020-10-09 NOTE — Progress Notes (Signed)
Diagnosis: COVID-19  Physician: Dr. Patrick Wright  Procedure: [facility] Med: Sotrovimab infusion - Provided patient with sotrovimab fact sheet for patients, parents, and caregivers prior to infusion.   Complications: No immediate complications noted  Discharge: Discharged home    

## 2020-10-15 DIAGNOSIS — R69 Illness, unspecified: Secondary | ICD-10-CM | POA: Diagnosis not present

## 2020-10-15 DIAGNOSIS — F411 Generalized anxiety disorder: Secondary | ICD-10-CM | POA: Diagnosis not present

## 2020-10-20 DIAGNOSIS — F411 Generalized anxiety disorder: Secondary | ICD-10-CM | POA: Diagnosis not present

## 2020-10-20 DIAGNOSIS — G4700 Insomnia, unspecified: Secondary | ICD-10-CM | POA: Diagnosis not present

## 2020-10-20 DIAGNOSIS — R69 Illness, unspecified: Secondary | ICD-10-CM | POA: Diagnosis not present

## 2020-10-20 DIAGNOSIS — U071 COVID-19: Secondary | ICD-10-CM | POA: Diagnosis not present

## 2020-10-23 DIAGNOSIS — R69 Illness, unspecified: Secondary | ICD-10-CM | POA: Diagnosis not present

## 2020-10-23 DIAGNOSIS — F411 Generalized anxiety disorder: Secondary | ICD-10-CM | POA: Diagnosis not present

## 2020-10-23 DIAGNOSIS — F332 Major depressive disorder, recurrent severe without psychotic features: Secondary | ICD-10-CM | POA: Diagnosis not present

## 2020-10-27 DIAGNOSIS — F411 Generalized anxiety disorder: Secondary | ICD-10-CM | POA: Diagnosis not present

## 2020-10-27 DIAGNOSIS — R69 Illness, unspecified: Secondary | ICD-10-CM | POA: Diagnosis not present

## 2020-11-03 DIAGNOSIS — F411 Generalized anxiety disorder: Secondary | ICD-10-CM | POA: Diagnosis not present

## 2020-11-03 DIAGNOSIS — R69 Illness, unspecified: Secondary | ICD-10-CM | POA: Diagnosis not present

## 2020-11-17 DIAGNOSIS — F411 Generalized anxiety disorder: Secondary | ICD-10-CM | POA: Diagnosis not present

## 2020-11-17 DIAGNOSIS — R69 Illness, unspecified: Secondary | ICD-10-CM | POA: Diagnosis not present

## 2020-11-24 DIAGNOSIS — F411 Generalized anxiety disorder: Secondary | ICD-10-CM | POA: Diagnosis not present

## 2020-11-24 DIAGNOSIS — R69 Illness, unspecified: Secondary | ICD-10-CM | POA: Diagnosis not present

## 2020-11-26 DIAGNOSIS — F332 Major depressive disorder, recurrent severe without psychotic features: Secondary | ICD-10-CM | POA: Diagnosis not present

## 2020-11-26 DIAGNOSIS — R69 Illness, unspecified: Secondary | ICD-10-CM | POA: Diagnosis not present

## 2020-11-26 DIAGNOSIS — F411 Generalized anxiety disorder: Secondary | ICD-10-CM | POA: Diagnosis not present

## 2020-12-01 DIAGNOSIS — R69 Illness, unspecified: Secondary | ICD-10-CM | POA: Diagnosis not present

## 2020-12-01 DIAGNOSIS — F411 Generalized anxiety disorder: Secondary | ICD-10-CM | POA: Diagnosis not present

## 2020-12-15 DIAGNOSIS — R69 Illness, unspecified: Secondary | ICD-10-CM | POA: Diagnosis not present

## 2020-12-15 DIAGNOSIS — F411 Generalized anxiety disorder: Secondary | ICD-10-CM | POA: Diagnosis not present

## 2020-12-18 DIAGNOSIS — M898X1 Other specified disorders of bone, shoulder: Secondary | ICD-10-CM | POA: Diagnosis not present

## 2021-01-01 DIAGNOSIS — F411 Generalized anxiety disorder: Secondary | ICD-10-CM | POA: Diagnosis not present

## 2021-01-01 DIAGNOSIS — F332 Major depressive disorder, recurrent severe without psychotic features: Secondary | ICD-10-CM | POA: Diagnosis not present

## 2021-01-01 DIAGNOSIS — R69 Illness, unspecified: Secondary | ICD-10-CM | POA: Diagnosis not present

## 2021-01-05 DIAGNOSIS — R69 Illness, unspecified: Secondary | ICD-10-CM | POA: Diagnosis not present

## 2021-01-05 DIAGNOSIS — H6121 Impacted cerumen, right ear: Secondary | ICD-10-CM | POA: Diagnosis not present

## 2021-01-05 DIAGNOSIS — J343 Hypertrophy of nasal turbinates: Secondary | ICD-10-CM | POA: Diagnosis not present

## 2021-01-05 DIAGNOSIS — H903 Sensorineural hearing loss, bilateral: Secondary | ICD-10-CM | POA: Diagnosis not present

## 2021-01-05 DIAGNOSIS — J310 Chronic rhinitis: Secondary | ICD-10-CM | POA: Diagnosis not present

## 2021-01-05 DIAGNOSIS — F411 Generalized anxiety disorder: Secondary | ICD-10-CM | POA: Diagnosis not present

## 2021-01-06 ENCOUNTER — Ambulatory Visit: Payer: Medicare HMO | Admitting: Certified Nurse Midwife

## 2021-01-07 ENCOUNTER — Ambulatory Visit: Payer: Medicare HMO | Admitting: Obstetrics and Gynecology

## 2021-01-12 DIAGNOSIS — F411 Generalized anxiety disorder: Secondary | ICD-10-CM | POA: Diagnosis not present

## 2021-01-12 DIAGNOSIS — R69 Illness, unspecified: Secondary | ICD-10-CM | POA: Diagnosis not present

## 2021-01-19 DIAGNOSIS — F411 Generalized anxiety disorder: Secondary | ICD-10-CM | POA: Diagnosis not present

## 2021-01-19 DIAGNOSIS — R69 Illness, unspecified: Secondary | ICD-10-CM | POA: Diagnosis not present

## 2021-01-29 DIAGNOSIS — F332 Major depressive disorder, recurrent severe without psychotic features: Secondary | ICD-10-CM | POA: Diagnosis not present

## 2021-01-29 DIAGNOSIS — R69 Illness, unspecified: Secondary | ICD-10-CM | POA: Diagnosis not present

## 2021-01-29 DIAGNOSIS — F411 Generalized anxiety disorder: Secondary | ICD-10-CM | POA: Diagnosis not present

## 2021-02-02 DIAGNOSIS — F411 Generalized anxiety disorder: Secondary | ICD-10-CM | POA: Diagnosis not present

## 2021-02-02 DIAGNOSIS — R69 Illness, unspecified: Secondary | ICD-10-CM | POA: Diagnosis not present

## 2021-02-09 DIAGNOSIS — R69 Illness, unspecified: Secondary | ICD-10-CM | POA: Diagnosis not present

## 2021-02-09 DIAGNOSIS — F411 Generalized anxiety disorder: Secondary | ICD-10-CM | POA: Diagnosis not present

## 2021-02-11 ENCOUNTER — Other Ambulatory Visit (HOSPITAL_COMMUNITY)
Admission: RE | Admit: 2021-02-11 | Discharge: 2021-02-11 | Disposition: A | Discharge status: Home / Self Care | Payer: Medicare HMO | Source: Ambulatory Visit | Attending: Obstetrics and Gynecology | Admitting: Obstetrics and Gynecology

## 2021-02-11 ENCOUNTER — Encounter: Payer: Self-pay | Admitting: Obstetrics and Gynecology

## 2021-02-11 ENCOUNTER — Ambulatory Visit: Payer: Medicare HMO | Admitting: Obstetrics and Gynecology

## 2021-02-11 ENCOUNTER — Other Ambulatory Visit: Payer: Self-pay

## 2021-02-11 VITALS — BP 158/84 | HR 56 | Ht 60.5 in | Wt 127.0 lb

## 2021-02-11 DIAGNOSIS — R87610 Atypical squamous cells of undetermined significance on cytologic smear of cervix (ASC-US): Secondary | ICD-10-CM | POA: Insufficient documentation

## 2021-02-11 DIAGNOSIS — Z1151 Encounter for screening for human papillomavirus (HPV): Secondary | ICD-10-CM | POA: Diagnosis not present

## 2021-02-11 DIAGNOSIS — M8580 Other specified disorders of bone density and structure, unspecified site: Secondary | ICD-10-CM

## 2021-02-11 DIAGNOSIS — Z1239 Encounter for other screening for malignant neoplasm of breast: Secondary | ICD-10-CM | POA: Diagnosis not present

## 2021-02-11 DIAGNOSIS — Z01419 Encounter for gynecological examination (general) (routine) without abnormal findings: Secondary | ICD-10-CM

## 2021-02-11 DIAGNOSIS — Z124 Encounter for screening for malignant neoplasm of cervix: Secondary | ICD-10-CM

## 2021-02-11 DIAGNOSIS — R69 Illness, unspecified: Secondary | ICD-10-CM | POA: Diagnosis not present

## 2021-02-11 NOTE — Progress Notes (Signed)
GYNECOLOGY  VISIT   HPI: 70 y.o.   Married  Caucasian  female   G2P2002 with Patient's last menstrual period was 10/14/2002 (approximate).   here for Breast and pelvic exam.   No vaginal bleeding or discharge.   States personal history of a child committing suicide.   GYNECOLOGIC HISTORY: Patient's last menstrual period was 10/14/2002 (approximate). Contraception:  Vasectomy Menopausal hormone therapy:  NA Last mammogram: 06-12-20 3D/Neg/Birads1 Last pap smear: 12-19-17 Neg:Neg HR HPV, 12-04-15 Neg:Neg HR HPV, 11-29-13 Neg:Neg HR HPV (Hx of LEEP 1994--[facility] I)        OB History    Gravida  2   Para  2   Term  2   Preterm  0   AB  0   Living  2     SAB  0   IAB  0   Ectopic  0   Multiple  0   Live Births  2              Patient Active Problem List   Diagnosis Date Noted  . Bipolar disorder ([place]) 03/20/2018  . Bipolar disorder, unspecified ([place]) 09/26/2017    Past Medical History:  Diagnosis Date  . Abnormal Pap smear 1994   --[facility]  . Allergic rhinitis   . Anxiety   . Dense breasts   . Endocervical polyp 01/1993   -benign  . Hypercholesterolemia   . Hypertension   . Osteopenia     Past Surgical History:  Procedure Laterality Date  . CERVICAL BIOPSY  W/ LOOP ELECTRODE EXCISION  03/1993   [facility]  . COLONOSCOPY  2016  . endocervical polyp  01-27-93   -benign  . TOOTH EXTRACTION  08/2017    Current Outpatient Medications  Medication Sig Dispense Refill  . ARIPiprazole (ABILIFY) 2 MG tablet Take 2 mg by mouth daily.    Marland Kitchen atenolol (TENORMIN) 50 MG tablet Take 50 mg by mouth daily.      No current facility-administered medications for this visit.     ALLERGIES: Ambien [zolpidem tartrate], Amlodipine besylate, Benadryl [diphenhydramine hcl], Buspar [buspirone], Flonase [fluticasone propionate], Gabapentin, Nickel, Nortriptyline, Other, Paxil [paroxetine hcl], Remeron [mirtazapine], Trazodone hcl, Tylenol pm extra [diphenhydramine-apap (sleep)], Vitamin  d analogs, and Zoloft [sertraline hcl]  Family History  Problem Relation Age of Onset  . Stroke Father 61  . Kidney disease Father   . Hypertension Father   . Hypertension Sister 41  . Hyperlipidemia Sister   . COPD Sister   . Breast cancer Sister 103  . Thyroid disease Mother 78  . Heart failure Mother   . Heart disease Mother   . Hypertension Mother   . COPD Mother   . Heart disease Brother 7       AFIB  . Suicidality Son 39       07/20/2013  . Thyroid disease Sister 93    Social History   Socioeconomic History  . Marital status: Married    Spouse name: Sherren Mocha  . Number of children: 2  . Years of education: 55  . Highest education level: Not on file  Occupational History  . 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 Smoking status: Former Smoker    Packs/day: 0.50    Years: 28.00    Pack years: 14.00    Types: Cigarettes    Quit date: 07/01/2004    Years since quitting: 16.6  . Smokeless tobacco: Never Used  Vaping Use  .  Vaping Use: Never used  Substance and Sexual Activity  . Alcohol use: Yes    Comment: occ wine  . Drug use: No  . Sexual activity: Yes    Partners: Male    Birth control/protection: Post-menopausal  Other Topics Concern  . Not on file  Social History Narrative   Lives with Sherren Mocha, one child deceased   Caffeine- 2 cups of tea   Social Determinants of Health   Financial Resource Strain: Not on file  Food Insecurity: Not on file  Transportation Needs: Not on file  Physical Activity: Not on file  Stress: Not on file  Social Connections: Not on file  Intimate Partner Violence: Not on file    Review of Systems  All other systems reviewed and are negative.   See HPI.  PHYSICAL EXAMINATION:    BP (!) 158/84   Pulse (!) 56   Ht 5' 0.5" (1.537 m)   Wt 127 lb (57.6 kg)   LMP 10/14/2002 (Approximate)   SpO2 98%   BMI 24.39 kg/m     General appearance: alert, cooperative and appears stated age Lungs:  clear to auscultation bilaterally Breasts: normal appearance, no masses or tenderness, No nipple retraction or dimpling, No nipple discharge or bleeding, No axillary or supraclavicular adenopathy Heart: regular rate and rhythm Abdomen: soft, non-tender, no masses,  no organomegaly Extremities: extremities normal, atraumatic, no cyanosis or edema Skin: Skin color, texture, turgor normal. No rashes or lesions No abnormal inguinal nodes palpated Neurologic: Grossly normal  Pelvic: External genitalia:  no lesions              Urethra:  normal appearing urethra with no masses, tenderness or lesions              Bartholins and Skenes: normal                 Vagina: normal appearing vagina with normal color and discharge, no lesions              Cervix: no lesions                Bimanual Exam:  Uterus:  normal size, contour, position, consistency, mobility, non-tender              Adnexa: no mass, fullness, tenderness              Rectal exam: Yes.  .  Confirms.              Anus:  normal sphincter tone, no lesions  Chaperone was present for exam.  ASSESSMENT  Screening breast exam.  Pelvic exam with no abnormal findings. Cervical cancer screening.  Hx LEEP.  Osteopenia.  FH breast cancer sister. Bereavement.   PLAN  Yearly mammogram recommended.  Self breast exam discussed. Pap and reflex HR HPV collected.  Calcium 1200 mg in divided doses and vit D 600 IU daily reviewed.  Weight bearing exercise recommended.  BMD in 2023. Support given for the loss of her child.  Fu in one year and prn.  25 min  total time was spent for this patient encounter, including preparation, face-to-face counseling with the patient, coordination of care, and documentation of the encounter.

## 2021-02-11 NOTE — Patient Instructions (Signed)

## 2021-02-16 DIAGNOSIS — F411 Generalized anxiety disorder: Secondary | ICD-10-CM | POA: Diagnosis not present

## 2021-02-16 DIAGNOSIS — R69 Illness, unspecified: Secondary | ICD-10-CM | POA: Diagnosis not present

## 2021-02-18 LAB — CYTOLOGY - PAP
Comment: NEGATIVE
Diagnosis: UNDETERMINED — AB
High risk HPV: NEGATIVE

## 2021-03-01 DIAGNOSIS — F411 Generalized anxiety disorder: Secondary | ICD-10-CM | POA: Diagnosis not present

## 2021-03-01 DIAGNOSIS — R69 Illness, unspecified: Secondary | ICD-10-CM | POA: Diagnosis not present

## 2021-03-01 DIAGNOSIS — F332 Major depressive disorder, recurrent severe without psychotic features: Secondary | ICD-10-CM | POA: Diagnosis not present

## 2021-03-02 DIAGNOSIS — R69 Illness, unspecified: Secondary | ICD-10-CM | POA: Diagnosis not present

## 2021-03-02 DIAGNOSIS — F411 Generalized anxiety disorder: Secondary | ICD-10-CM | POA: Diagnosis not present

## 2021-03-09 DIAGNOSIS — F411 Generalized anxiety disorder: Secondary | ICD-10-CM | POA: Diagnosis not present

## 2021-03-09 DIAGNOSIS — R69 Illness, unspecified: Secondary | ICD-10-CM | POA: Diagnosis not present

## 2021-03-16 DIAGNOSIS — R69 Illness, unspecified: Secondary | ICD-10-CM | POA: Diagnosis not present

## 2021-03-16 DIAGNOSIS — F411 Generalized anxiety disorder: Secondary | ICD-10-CM | POA: Diagnosis not present

## 2021-03-23 DIAGNOSIS — R69 Illness, unspecified: Secondary | ICD-10-CM | POA: Diagnosis not present

## 2021-03-23 DIAGNOSIS — F411 Generalized anxiety disorder: Secondary | ICD-10-CM | POA: Diagnosis not present

## 2021-04-01 DIAGNOSIS — R69 Illness, unspecified: Secondary | ICD-10-CM | POA: Diagnosis not present

## 2021-04-01 DIAGNOSIS — F411 Generalized anxiety disorder: Secondary | ICD-10-CM | POA: Diagnosis not present

## 2021-04-01 DIAGNOSIS — F4312 Post-traumatic stress disorder, chronic: Secondary | ICD-10-CM | POA: Diagnosis not present

## 2021-04-06 DIAGNOSIS — R69 Illness, unspecified: Secondary | ICD-10-CM | POA: Diagnosis not present

## 2021-04-06 DIAGNOSIS — F411 Generalized anxiety disorder: Secondary | ICD-10-CM | POA: Diagnosis not present

## 2021-04-13 DIAGNOSIS — R69 Illness, unspecified: Secondary | ICD-10-CM | POA: Diagnosis not present

## 2021-04-13 DIAGNOSIS — F411 Generalized anxiety disorder: Secondary | ICD-10-CM | POA: Diagnosis not present

## 2021-04-27 DIAGNOSIS — F411 Generalized anxiety disorder: Secondary | ICD-10-CM | POA: Diagnosis not present

## 2021-04-27 DIAGNOSIS — R69 Illness, unspecified: Secondary | ICD-10-CM | POA: Diagnosis not present

## 2021-04-29 DIAGNOSIS — F4312 Post-traumatic stress disorder, chronic: Secondary | ICD-10-CM | POA: Diagnosis not present

## 2021-04-29 DIAGNOSIS — F411 Generalized anxiety disorder: Secondary | ICD-10-CM | POA: Diagnosis not present

## 2021-04-29 DIAGNOSIS — R69 Illness, unspecified: Secondary | ICD-10-CM | POA: Diagnosis not present

## 2021-05-04 DIAGNOSIS — R69 Illness, unspecified: Secondary | ICD-10-CM | POA: Diagnosis not present

## 2021-05-04 DIAGNOSIS — F411 Generalized anxiety disorder: Secondary | ICD-10-CM | POA: Diagnosis not present

## 2021-05-18 DIAGNOSIS — R69 Illness, unspecified: Secondary | ICD-10-CM | POA: Diagnosis not present

## 2021-05-18 DIAGNOSIS — F411 Generalized anxiety disorder: Secondary | ICD-10-CM | POA: Diagnosis not present

## 2021-05-20 DIAGNOSIS — D1801 Hemangioma of skin and subcutaneous tissue: Secondary | ICD-10-CM | POA: Diagnosis not present

## 2021-05-20 DIAGNOSIS — L821 Other seborrheic keratosis: Secondary | ICD-10-CM | POA: Diagnosis not present

## 2021-05-20 DIAGNOSIS — L720 Epidermal cyst: Secondary | ICD-10-CM | POA: Diagnosis not present

## 2021-05-25 DIAGNOSIS — F411 Generalized anxiety disorder: Secondary | ICD-10-CM | POA: Diagnosis not present

## 2021-05-25 DIAGNOSIS — R69 Illness, unspecified: Secondary | ICD-10-CM | POA: Diagnosis not present

## 2021-05-27 DIAGNOSIS — F4312 Post-traumatic stress disorder, chronic: Secondary | ICD-10-CM | POA: Diagnosis not present

## 2021-05-27 DIAGNOSIS — F411 Generalized anxiety disorder: Secondary | ICD-10-CM | POA: Diagnosis not present

## 2021-05-27 DIAGNOSIS — R69 Illness, unspecified: Secondary | ICD-10-CM | POA: Diagnosis not present

## 2021-06-08 DIAGNOSIS — R69 Illness, unspecified: Secondary | ICD-10-CM | POA: Diagnosis not present

## 2021-06-08 DIAGNOSIS — F411 Generalized anxiety disorder: Secondary | ICD-10-CM | POA: Diagnosis not present

## 2021-06-15 ENCOUNTER — Ambulatory Visit
Admission: RE | Admit: 2021-06-15 | Discharge: 2021-06-15 | Disposition: A | Discharge status: Home / Self Care | Payer: Medicare HMO | Source: Ambulatory Visit | Attending: Obstetrics and Gynecology | Admitting: Obstetrics and Gynecology

## 2021-06-15 ENCOUNTER — Other Ambulatory Visit: Payer: Self-pay

## 2021-06-15 DIAGNOSIS — Z1231 Encounter for screening mammogram for malignant neoplasm of breast: Secondary | ICD-10-CM

## 2021-06-15 DIAGNOSIS — F411 Generalized anxiety disorder: Secondary | ICD-10-CM | POA: Diagnosis not present

## 2021-06-15 DIAGNOSIS — R69 Illness, unspecified: Secondary | ICD-10-CM | POA: Diagnosis not present

## 2021-06-22 DIAGNOSIS — F411 Generalized anxiety disorder: Secondary | ICD-10-CM | POA: Diagnosis not present

## 2021-06-22 DIAGNOSIS — R69 Illness, unspecified: Secondary | ICD-10-CM | POA: Diagnosis not present

## 2021-07-13 DIAGNOSIS — R69 Illness, unspecified: Secondary | ICD-10-CM | POA: Diagnosis not present

## 2021-07-13 DIAGNOSIS — F411 Generalized anxiety disorder: Secondary | ICD-10-CM | POA: Diagnosis not present

## 2021-07-14 DIAGNOSIS — F411 Generalized anxiety disorder: Secondary | ICD-10-CM | POA: Diagnosis not present

## 2021-07-14 DIAGNOSIS — F4312 Post-traumatic stress disorder, chronic: Secondary | ICD-10-CM | POA: Diagnosis not present

## 2021-07-14 DIAGNOSIS — R69 Illness, unspecified: Secondary | ICD-10-CM | POA: Diagnosis not present

## 2021-07-20 DIAGNOSIS — R69 Illness, unspecified: Secondary | ICD-10-CM | POA: Diagnosis not present

## 2021-07-20 DIAGNOSIS — F411 Generalized anxiety disorder: Secondary | ICD-10-CM | POA: Diagnosis not present

## 2021-07-20 DIAGNOSIS — F341 Dysthymic disorder: Secondary | ICD-10-CM | POA: Diagnosis not present

## 2021-07-29 DIAGNOSIS — K219 Gastro-esophageal reflux disease without esophagitis: Secondary | ICD-10-CM | POA: Diagnosis not present

## 2021-07-29 DIAGNOSIS — I10 Essential (primary) hypertension: Secondary | ICD-10-CM | POA: Diagnosis not present

## 2021-07-29 DIAGNOSIS — G4700 Insomnia, unspecified: Secondary | ICD-10-CM | POA: Diagnosis not present

## 2021-07-29 DIAGNOSIS — E559 Vitamin D deficiency, unspecified: Secondary | ICD-10-CM | POA: Diagnosis not present

## 2021-07-29 DIAGNOSIS — R69 Illness, unspecified: Secondary | ICD-10-CM | POA: Diagnosis not present

## 2021-07-29 DIAGNOSIS — D692 Other nonthrombocytopenic purpura: Secondary | ICD-10-CM | POA: Diagnosis not present

## 2021-07-29 DIAGNOSIS — J309 Allergic rhinitis, unspecified: Secondary | ICD-10-CM | POA: Diagnosis not present

## 2021-07-29 DIAGNOSIS — Z0000 Encounter for general adult medical examination without abnormal findings: Secondary | ICD-10-CM | POA: Diagnosis not present

## 2021-07-29 DIAGNOSIS — Z79899 Other long term (current) drug therapy: Secondary | ICD-10-CM | POA: Diagnosis not present

## 2021-07-29 DIAGNOSIS — M8580 Other specified disorders of bone density and structure, unspecified site: Secondary | ICD-10-CM | POA: Diagnosis not present

## 2021-08-03 DIAGNOSIS — F341 Dysthymic disorder: Secondary | ICD-10-CM | POA: Diagnosis not present

## 2021-08-03 DIAGNOSIS — F411 Generalized anxiety disorder: Secondary | ICD-10-CM | POA: Diagnosis not present

## 2021-08-03 DIAGNOSIS — R69 Illness, unspecified: Secondary | ICD-10-CM | POA: Diagnosis not present

## 2021-08-10 DIAGNOSIS — F341 Dysthymic disorder: Secondary | ICD-10-CM | POA: Diagnosis not present

## 2021-08-10 DIAGNOSIS — F411 Generalized anxiety disorder: Secondary | ICD-10-CM | POA: Diagnosis not present

## 2021-08-10 DIAGNOSIS — R69 Illness, unspecified: Secondary | ICD-10-CM | POA: Diagnosis not present

## 2021-08-12 DIAGNOSIS — F331 Major depressive disorder, recurrent, moderate: Secondary | ICD-10-CM | POA: Diagnosis not present

## 2021-08-12 DIAGNOSIS — F411 Generalized anxiety disorder: Secondary | ICD-10-CM | POA: Diagnosis not present

## 2021-08-12 DIAGNOSIS — F4310 Post-traumatic stress disorder, unspecified: Secondary | ICD-10-CM | POA: Diagnosis not present

## 2021-08-12 DIAGNOSIS — R69 Illness, unspecified: Secondary | ICD-10-CM | POA: Diagnosis not present

## 2021-08-17 DIAGNOSIS — R69 Illness, unspecified: Secondary | ICD-10-CM | POA: Diagnosis not present

## 2021-08-17 DIAGNOSIS — F411 Generalized anxiety disorder: Secondary | ICD-10-CM | POA: Diagnosis not present

## 2021-08-17 DIAGNOSIS — F341 Dysthymic disorder: Secondary | ICD-10-CM | POA: Diagnosis not present

## 2021-08-24 DIAGNOSIS — F341 Dysthymic disorder: Secondary | ICD-10-CM | POA: Diagnosis not present

## 2021-08-24 DIAGNOSIS — F411 Generalized anxiety disorder: Secondary | ICD-10-CM | POA: Diagnosis not present

## 2021-08-24 DIAGNOSIS — R69 Illness, unspecified: Secondary | ICD-10-CM | POA: Diagnosis not present

## 2021-08-31 DIAGNOSIS — F411 Generalized anxiety disorder: Secondary | ICD-10-CM | POA: Diagnosis not present

## 2021-08-31 DIAGNOSIS — F341 Dysthymic disorder: Secondary | ICD-10-CM | POA: Diagnosis not present

## 2021-08-31 DIAGNOSIS — R69 Illness, unspecified: Secondary | ICD-10-CM | POA: Diagnosis not present

## 2021-09-07 DIAGNOSIS — H2513 Age-related nuclear cataract, bilateral: Secondary | ICD-10-CM | POA: Diagnosis not present

## 2021-09-07 DIAGNOSIS — H35363 Drusen (degenerative) of macula, bilateral: Secondary | ICD-10-CM | POA: Diagnosis not present

## 2021-09-07 DIAGNOSIS — H35033 Hypertensive retinopathy, bilateral: Secondary | ICD-10-CM | POA: Diagnosis not present

## 2021-09-07 DIAGNOSIS — H43813 Vitreous degeneration, bilateral: Secondary | ICD-10-CM | POA: Diagnosis not present

## 2021-09-07 DIAGNOSIS — H524 Presbyopia: Secondary | ICD-10-CM | POA: Diagnosis not present

## 2021-09-07 DIAGNOSIS — F411 Generalized anxiety disorder: Secondary | ICD-10-CM | POA: Diagnosis not present

## 2021-09-07 DIAGNOSIS — R69 Illness, unspecified: Secondary | ICD-10-CM | POA: Diagnosis not present

## 2021-09-07 DIAGNOSIS — F341 Dysthymic disorder: Secondary | ICD-10-CM | POA: Diagnosis not present

## 2021-09-14 DIAGNOSIS — F341 Dysthymic disorder: Secondary | ICD-10-CM | POA: Diagnosis not present

## 2021-09-14 DIAGNOSIS — F411 Generalized anxiety disorder: Secondary | ICD-10-CM | POA: Diagnosis not present

## 2021-09-14 DIAGNOSIS — R69 Illness, unspecified: Secondary | ICD-10-CM | POA: Diagnosis not present

## 2021-09-21 DIAGNOSIS — F411 Generalized anxiety disorder: Secondary | ICD-10-CM | POA: Diagnosis not present

## 2021-09-21 DIAGNOSIS — F341 Dysthymic disorder: Secondary | ICD-10-CM | POA: Diagnosis not present

## 2021-09-21 DIAGNOSIS — R69 Illness, unspecified: Secondary | ICD-10-CM | POA: Diagnosis not present

## 2021-09-30 DIAGNOSIS — F411 Generalized anxiety disorder: Secondary | ICD-10-CM | POA: Diagnosis not present

## 2021-09-30 DIAGNOSIS — F331 Major depressive disorder, recurrent, moderate: Secondary | ICD-10-CM | POA: Diagnosis not present

## 2021-09-30 DIAGNOSIS — R69 Illness, unspecified: Secondary | ICD-10-CM | POA: Diagnosis not present

## 2021-09-30 DIAGNOSIS — F4310 Post-traumatic stress disorder, unspecified: Secondary | ICD-10-CM | POA: Diagnosis not present

## 2021-10-06 DIAGNOSIS — F411 Generalized anxiety disorder: Secondary | ICD-10-CM | POA: Diagnosis not present

## 2021-10-06 DIAGNOSIS — R69 Illness, unspecified: Secondary | ICD-10-CM | POA: Diagnosis not present

## 2021-10-06 DIAGNOSIS — F341 Dysthymic disorder: Secondary | ICD-10-CM | POA: Diagnosis not present

## 2021-10-12 DIAGNOSIS — R69 Illness, unspecified: Secondary | ICD-10-CM | POA: Diagnosis not present

## 2021-10-12 DIAGNOSIS — F411 Generalized anxiety disorder: Secondary | ICD-10-CM | POA: Diagnosis not present

## 2021-10-12 DIAGNOSIS — F341 Dysthymic disorder: Secondary | ICD-10-CM | POA: Diagnosis not present

## 2021-10-18 DIAGNOSIS — F4310 Post-traumatic stress disorder, unspecified: Secondary | ICD-10-CM | POA: Diagnosis not present

## 2021-10-18 DIAGNOSIS — R69 Illness, unspecified: Secondary | ICD-10-CM | POA: Diagnosis not present

## 2021-10-18 DIAGNOSIS — F411 Generalized anxiety disorder: Secondary | ICD-10-CM | POA: Diagnosis not present

## 2021-10-18 DIAGNOSIS — F331 Major depressive disorder, recurrent, moderate: Secondary | ICD-10-CM | POA: Diagnosis not present

## 2021-10-19 DIAGNOSIS — F341 Dysthymic disorder: Secondary | ICD-10-CM | POA: Diagnosis not present

## 2021-10-19 DIAGNOSIS — R69 Illness, unspecified: Secondary | ICD-10-CM | POA: Diagnosis not present

## 2021-10-19 DIAGNOSIS — F411 Generalized anxiety disorder: Secondary | ICD-10-CM | POA: Diagnosis not present

## 2021-11-02 DIAGNOSIS — F411 Generalized anxiety disorder: Secondary | ICD-10-CM | POA: Diagnosis not present

## 2021-11-02 DIAGNOSIS — F341 Dysthymic disorder: Secondary | ICD-10-CM | POA: Diagnosis not present

## 2021-11-02 DIAGNOSIS — R69 Illness, unspecified: Secondary | ICD-10-CM | POA: Diagnosis not present

## 2021-11-17 DIAGNOSIS — F4310 Post-traumatic stress disorder, unspecified: Secondary | ICD-10-CM | POA: Diagnosis not present

## 2021-11-17 DIAGNOSIS — R69 Illness, unspecified: Secondary | ICD-10-CM | POA: Diagnosis not present

## 2021-11-17 DIAGNOSIS — F411 Generalized anxiety disorder: Secondary | ICD-10-CM | POA: Diagnosis not present

## 2021-11-17 DIAGNOSIS — F331 Major depressive disorder, recurrent, moderate: Secondary | ICD-10-CM | POA: Diagnosis not present

## 2021-11-23 DIAGNOSIS — F341 Dysthymic disorder: Secondary | ICD-10-CM | POA: Diagnosis not present

## 2021-11-23 DIAGNOSIS — R69 Illness, unspecified: Secondary | ICD-10-CM | POA: Diagnosis not present

## 2021-11-23 DIAGNOSIS — F411 Generalized anxiety disorder: Secondary | ICD-10-CM | POA: Diagnosis not present

## 2021-11-30 DIAGNOSIS — R69 Illness, unspecified: Secondary | ICD-10-CM | POA: Diagnosis not present

## 2021-11-30 DIAGNOSIS — F341 Dysthymic disorder: Secondary | ICD-10-CM | POA: Diagnosis not present

## 2021-11-30 DIAGNOSIS — F411 Generalized anxiety disorder: Secondary | ICD-10-CM | POA: Diagnosis not present

## 2021-12-07 DIAGNOSIS — F341 Dysthymic disorder: Secondary | ICD-10-CM | POA: Diagnosis not present

## 2021-12-07 DIAGNOSIS — F411 Generalized anxiety disorder: Secondary | ICD-10-CM | POA: Diagnosis not present

## 2021-12-07 DIAGNOSIS — R69 Illness, unspecified: Secondary | ICD-10-CM | POA: Diagnosis not present

## 2021-12-14 DIAGNOSIS — F411 Generalized anxiety disorder: Secondary | ICD-10-CM | POA: Diagnosis not present

## 2021-12-14 DIAGNOSIS — F341 Dysthymic disorder: Secondary | ICD-10-CM | POA: Diagnosis not present

## 2021-12-14 DIAGNOSIS — R69 Illness, unspecified: Secondary | ICD-10-CM | POA: Diagnosis not present

## 2021-12-17 DIAGNOSIS — F331 Major depressive disorder, recurrent, moderate: Secondary | ICD-10-CM | POA: Diagnosis not present

## 2021-12-17 DIAGNOSIS — R69 Illness, unspecified: Secondary | ICD-10-CM | POA: Diagnosis not present

## 2021-12-17 DIAGNOSIS — F411 Generalized anxiety disorder: Secondary | ICD-10-CM | POA: Diagnosis not present

## 2021-12-17 DIAGNOSIS — F4310 Post-traumatic stress disorder, unspecified: Secondary | ICD-10-CM | POA: Diagnosis not present

## 2021-12-21 DIAGNOSIS — F411 Generalized anxiety disorder: Secondary | ICD-10-CM | POA: Diagnosis not present

## 2021-12-21 DIAGNOSIS — F341 Dysthymic disorder: Secondary | ICD-10-CM | POA: Diagnosis not present

## 2021-12-21 DIAGNOSIS — R69 Illness, unspecified: Secondary | ICD-10-CM | POA: Diagnosis not present

## 2021-12-28 DIAGNOSIS — F341 Dysthymic disorder: Secondary | ICD-10-CM | POA: Diagnosis not present

## 2021-12-28 DIAGNOSIS — R69 Illness, unspecified: Secondary | ICD-10-CM | POA: Diagnosis not present

## 2021-12-28 DIAGNOSIS — F411 Generalized anxiety disorder: Secondary | ICD-10-CM | POA: Diagnosis not present

## 2022-01-11 DIAGNOSIS — F411 Generalized anxiety disorder: Secondary | ICD-10-CM | POA: Diagnosis not present

## 2022-01-11 DIAGNOSIS — R69 Illness, unspecified: Secondary | ICD-10-CM | POA: Diagnosis not present

## 2022-01-11 DIAGNOSIS — F341 Dysthymic disorder: Secondary | ICD-10-CM | POA: Diagnosis not present

## 2022-01-14 DIAGNOSIS — F411 Generalized anxiety disorder: Secondary | ICD-10-CM | POA: Diagnosis not present

## 2022-01-14 DIAGNOSIS — F4310 Post-traumatic stress disorder, unspecified: Secondary | ICD-10-CM | POA: Diagnosis not present

## 2022-01-14 DIAGNOSIS — R69 Illness, unspecified: Secondary | ICD-10-CM | POA: Diagnosis not present

## 2022-01-14 DIAGNOSIS — F331 Major depressive disorder, recurrent, moderate: Secondary | ICD-10-CM | POA: Diagnosis not present

## 2022-01-18 DIAGNOSIS — F411 Generalized anxiety disorder: Secondary | ICD-10-CM | POA: Diagnosis not present

## 2022-01-18 DIAGNOSIS — R69 Illness, unspecified: Secondary | ICD-10-CM | POA: Diagnosis not present

## 2022-01-18 DIAGNOSIS — F341 Dysthymic disorder: Secondary | ICD-10-CM | POA: Diagnosis not present

## 2022-01-20 DIAGNOSIS — J3489 Other specified disorders of nose and nasal sinuses: Secondary | ICD-10-CM | POA: Diagnosis not present

## 2022-01-20 DIAGNOSIS — R040 Epistaxis: Secondary | ICD-10-CM | POA: Diagnosis not present

## 2022-01-25 DIAGNOSIS — F411 Generalized anxiety disorder: Secondary | ICD-10-CM | POA: Diagnosis not present

## 2022-01-25 DIAGNOSIS — R69 Illness, unspecified: Secondary | ICD-10-CM | POA: Diagnosis not present

## 2022-01-25 DIAGNOSIS — F341 Dysthymic disorder: Secondary | ICD-10-CM | POA: Diagnosis not present

## 2022-02-01 DIAGNOSIS — R69 Illness, unspecified: Secondary | ICD-10-CM | POA: Diagnosis not present

## 2022-02-01 DIAGNOSIS — F341 Dysthymic disorder: Secondary | ICD-10-CM | POA: Diagnosis not present

## 2022-02-01 DIAGNOSIS — F411 Generalized anxiety disorder: Secondary | ICD-10-CM | POA: Diagnosis not present

## 2022-02-08 DIAGNOSIS — F341 Dysthymic disorder: Secondary | ICD-10-CM | POA: Diagnosis not present

## 2022-02-08 DIAGNOSIS — F411 Generalized anxiety disorder: Secondary | ICD-10-CM | POA: Diagnosis not present

## 2022-02-08 DIAGNOSIS — R69 Illness, unspecified: Secondary | ICD-10-CM | POA: Diagnosis not present

## 2022-02-09 DIAGNOSIS — R040 Epistaxis: Secondary | ICD-10-CM | POA: Diagnosis not present

## 2022-02-15 DIAGNOSIS — F341 Dysthymic disorder: Secondary | ICD-10-CM | POA: Diagnosis not present

## 2022-02-15 DIAGNOSIS — F411 Generalized anxiety disorder: Secondary | ICD-10-CM | POA: Diagnosis not present

## 2022-02-15 DIAGNOSIS — R69 Illness, unspecified: Secondary | ICD-10-CM | POA: Diagnosis not present

## 2022-02-16 DIAGNOSIS — J3489 Other specified disorders of nose and nasal sinuses: Secondary | ICD-10-CM | POA: Diagnosis not present

## 2022-02-22 DIAGNOSIS — F331 Major depressive disorder, recurrent, moderate: Secondary | ICD-10-CM | POA: Diagnosis not present

## 2022-02-22 DIAGNOSIS — F411 Generalized anxiety disorder: Secondary | ICD-10-CM | POA: Diagnosis not present

## 2022-02-22 DIAGNOSIS — F341 Dysthymic disorder: Secondary | ICD-10-CM | POA: Diagnosis not present

## 2022-02-22 DIAGNOSIS — F4310 Post-traumatic stress disorder, unspecified: Secondary | ICD-10-CM | POA: Diagnosis not present

## 2022-02-22 DIAGNOSIS — R69 Illness, unspecified: Secondary | ICD-10-CM | POA: Diagnosis not present

## 2022-03-01 DIAGNOSIS — F411 Generalized anxiety disorder: Secondary | ICD-10-CM | POA: Diagnosis not present

## 2022-03-01 DIAGNOSIS — R69 Illness, unspecified: Secondary | ICD-10-CM | POA: Diagnosis not present

## 2022-03-01 DIAGNOSIS — F341 Dysthymic disorder: Secondary | ICD-10-CM | POA: Diagnosis not present

## 2022-03-08 DIAGNOSIS — F341 Dysthymic disorder: Secondary | ICD-10-CM | POA: Diagnosis not present

## 2022-03-08 DIAGNOSIS — R69 Illness, unspecified: Secondary | ICD-10-CM | POA: Diagnosis not present

## 2022-03-08 DIAGNOSIS — F411 Generalized anxiety disorder: Secondary | ICD-10-CM | POA: Diagnosis not present

## 2022-03-14 DIAGNOSIS — R040 Epistaxis: Secondary | ICD-10-CM | POA: Diagnosis not present

## 2022-03-15 DIAGNOSIS — F341 Dysthymic disorder: Secondary | ICD-10-CM | POA: Diagnosis not present

## 2022-03-15 DIAGNOSIS — F411 Generalized anxiety disorder: Secondary | ICD-10-CM | POA: Diagnosis not present

## 2022-03-15 DIAGNOSIS — R69 Illness, unspecified: Secondary | ICD-10-CM | POA: Diagnosis not present

## 2022-03-25 DIAGNOSIS — R69 Illness, unspecified: Secondary | ICD-10-CM | POA: Diagnosis not present

## 2022-03-25 DIAGNOSIS — F331 Major depressive disorder, recurrent, moderate: Secondary | ICD-10-CM | POA: Diagnosis not present

## 2022-03-25 DIAGNOSIS — F4310 Post-traumatic stress disorder, unspecified: Secondary | ICD-10-CM | POA: Diagnosis not present

## 2022-03-25 DIAGNOSIS — F411 Generalized anxiety disorder: Secondary | ICD-10-CM | POA: Diagnosis not present

## 2022-03-30 DIAGNOSIS — R69 Illness, unspecified: Secondary | ICD-10-CM | POA: Diagnosis not present

## 2022-03-30 DIAGNOSIS — F341 Dysthymic disorder: Secondary | ICD-10-CM | POA: Diagnosis not present

## 2022-03-30 DIAGNOSIS — F411 Generalized anxiety disorder: Secondary | ICD-10-CM | POA: Diagnosis not present

## 2022-04-05 DIAGNOSIS — F341 Dysthymic disorder: Secondary | ICD-10-CM | POA: Diagnosis not present

## 2022-04-05 DIAGNOSIS — R69 Illness, unspecified: Secondary | ICD-10-CM | POA: Diagnosis not present

## 2022-04-05 DIAGNOSIS — F411 Generalized anxiety disorder: Secondary | ICD-10-CM | POA: Diagnosis not present

## 2022-04-12 DIAGNOSIS — R69 Illness, unspecified: Secondary | ICD-10-CM | POA: Diagnosis not present

## 2022-04-12 DIAGNOSIS — F341 Dysthymic disorder: Secondary | ICD-10-CM | POA: Diagnosis not present

## 2022-04-12 DIAGNOSIS — F411 Generalized anxiety disorder: Secondary | ICD-10-CM | POA: Diagnosis not present

## 2022-04-19 DIAGNOSIS — R69 Illness, unspecified: Secondary | ICD-10-CM | POA: Diagnosis not present

## 2022-04-19 DIAGNOSIS — F341 Dysthymic disorder: Secondary | ICD-10-CM | POA: Diagnosis not present

## 2022-04-19 DIAGNOSIS — F411 Generalized anxiety disorder: Secondary | ICD-10-CM | POA: Diagnosis not present

## 2022-04-28 DIAGNOSIS — F331 Major depressive disorder, recurrent, moderate: Secondary | ICD-10-CM | POA: Diagnosis not present

## 2022-04-28 DIAGNOSIS — F411 Generalized anxiety disorder: Secondary | ICD-10-CM | POA: Diagnosis not present

## 2022-04-28 DIAGNOSIS — F4310 Post-traumatic stress disorder, unspecified: Secondary | ICD-10-CM | POA: Diagnosis not present

## 2022-04-28 DIAGNOSIS — R69 Illness, unspecified: Secondary | ICD-10-CM | POA: Diagnosis not present

## 2022-05-03 DIAGNOSIS — R69 Illness, unspecified: Secondary | ICD-10-CM | POA: Diagnosis not present

## 2022-05-03 DIAGNOSIS — F341 Dysthymic disorder: Secondary | ICD-10-CM | POA: Diagnosis not present

## 2022-05-03 DIAGNOSIS — F411 Generalized anxiety disorder: Secondary | ICD-10-CM | POA: Diagnosis not present

## 2022-05-10 DIAGNOSIS — F341 Dysthymic disorder: Secondary | ICD-10-CM | POA: Diagnosis not present

## 2022-05-10 DIAGNOSIS — R69 Illness, unspecified: Secondary | ICD-10-CM | POA: Diagnosis not present

## 2022-05-10 DIAGNOSIS — F411 Generalized anxiety disorder: Secondary | ICD-10-CM | POA: Diagnosis not present

## 2022-05-16 ENCOUNTER — Other Ambulatory Visit: Payer: Self-pay | Admitting: Obstetrics and Gynecology

## 2022-05-16 ENCOUNTER — Other Ambulatory Visit: Payer: Self-pay | Admitting: Family Medicine

## 2022-05-16 DIAGNOSIS — Z1231 Encounter for screening mammogram for malignant neoplasm of breast: Secondary | ICD-10-CM

## 2022-05-24 DIAGNOSIS — F411 Generalized anxiety disorder: Secondary | ICD-10-CM | POA: Diagnosis not present

## 2022-05-24 DIAGNOSIS — R69 Illness, unspecified: Secondary | ICD-10-CM | POA: Diagnosis not present

## 2022-05-24 DIAGNOSIS — F341 Dysthymic disorder: Secondary | ICD-10-CM | POA: Diagnosis not present

## 2022-06-01 DIAGNOSIS — F331 Major depressive disorder, recurrent, moderate: Secondary | ICD-10-CM | POA: Diagnosis not present

## 2022-06-01 DIAGNOSIS — F411 Generalized anxiety disorder: Secondary | ICD-10-CM | POA: Diagnosis not present

## 2022-06-01 DIAGNOSIS — R69 Illness, unspecified: Secondary | ICD-10-CM | POA: Diagnosis not present

## 2022-06-01 DIAGNOSIS — F4310 Post-traumatic stress disorder, unspecified: Secondary | ICD-10-CM | POA: Diagnosis not present

## 2022-06-06 DIAGNOSIS — R197 Diarrhea, unspecified: Secondary | ICD-10-CM | POA: Diagnosis not present

## 2022-06-06 DIAGNOSIS — R030 Elevated blood-pressure reading, without diagnosis of hypertension: Secondary | ICD-10-CM | POA: Diagnosis not present

## 2022-06-06 DIAGNOSIS — K6289 Other specified diseases of anus and rectum: Secondary | ICD-10-CM | POA: Diagnosis not present

## 2022-06-07 DIAGNOSIS — R69 Illness, unspecified: Secondary | ICD-10-CM | POA: Diagnosis not present

## 2022-06-07 DIAGNOSIS — F341 Dysthymic disorder: Secondary | ICD-10-CM | POA: Diagnosis not present

## 2022-06-07 DIAGNOSIS — F411 Generalized anxiety disorder: Secondary | ICD-10-CM | POA: Diagnosis not present

## 2022-06-14 DIAGNOSIS — R69 Illness, unspecified: Secondary | ICD-10-CM | POA: Diagnosis not present

## 2022-06-14 DIAGNOSIS — F411 Generalized anxiety disorder: Secondary | ICD-10-CM | POA: Diagnosis not present

## 2022-06-14 DIAGNOSIS — F341 Dysthymic disorder: Secondary | ICD-10-CM | POA: Diagnosis not present

## 2022-06-16 ENCOUNTER — Ambulatory Visit
Admission: RE | Admit: 2022-06-16 | Discharge: 2022-06-16 | Disposition: A | Discharge status: Home / Self Care | Payer: Medicare HMO | Source: Ambulatory Visit | Attending: Family Medicine | Admitting: Family Medicine

## 2022-06-16 DIAGNOSIS — Z1231 Encounter for screening mammogram for malignant neoplasm of breast: Secondary | ICD-10-CM

## 2022-06-17 DIAGNOSIS — I87321 Chronic venous hypertension (idiopathic) with inflammation of right lower extremity: Secondary | ICD-10-CM | POA: Diagnosis not present

## 2022-06-17 DIAGNOSIS — R600 Localized edema: Secondary | ICD-10-CM | POA: Diagnosis not present

## 2022-06-30 DIAGNOSIS — F411 Generalized anxiety disorder: Secondary | ICD-10-CM | POA: Diagnosis not present

## 2022-06-30 DIAGNOSIS — F341 Dysthymic disorder: Secondary | ICD-10-CM | POA: Diagnosis not present

## 2022-06-30 DIAGNOSIS — R69 Illness, unspecified: Secondary | ICD-10-CM | POA: Diagnosis not present

## 2022-07-05 DIAGNOSIS — F341 Dysthymic disorder: Secondary | ICD-10-CM | POA: Diagnosis not present

## 2022-07-05 DIAGNOSIS — R69 Illness, unspecified: Secondary | ICD-10-CM | POA: Diagnosis not present

## 2022-07-05 DIAGNOSIS — F411 Generalized anxiety disorder: Secondary | ICD-10-CM | POA: Diagnosis not present

## 2022-07-12 DIAGNOSIS — R69 Illness, unspecified: Secondary | ICD-10-CM | POA: Diagnosis not present

## 2022-07-12 DIAGNOSIS — F411 Generalized anxiety disorder: Secondary | ICD-10-CM | POA: Diagnosis not present

## 2022-07-12 DIAGNOSIS — F341 Dysthymic disorder: Secondary | ICD-10-CM | POA: Diagnosis not present

## 2022-07-13 DIAGNOSIS — I83893 Varicose veins of bilateral lower extremities with other complications: Secondary | ICD-10-CM | POA: Diagnosis not present

## 2022-07-13 DIAGNOSIS — I83891 Varicose veins of right lower extremities with other complications: Secondary | ICD-10-CM | POA: Diagnosis not present

## 2022-07-13 DIAGNOSIS — R600 Localized edema: Secondary | ICD-10-CM | POA: Diagnosis not present

## 2022-07-13 DIAGNOSIS — I10 Essential (primary) hypertension: Secondary | ICD-10-CM | POA: Diagnosis not present

## 2022-07-19 DIAGNOSIS — F411 Generalized anxiety disorder: Secondary | ICD-10-CM | POA: Diagnosis not present

## 2022-07-19 DIAGNOSIS — F341 Dysthymic disorder: Secondary | ICD-10-CM | POA: Diagnosis not present

## 2022-07-19 DIAGNOSIS — R69 Illness, unspecified: Secondary | ICD-10-CM | POA: Diagnosis not present

## 2022-07-26 DIAGNOSIS — F331 Major depressive disorder, recurrent, moderate: Secondary | ICD-10-CM | POA: Diagnosis not present

## 2022-07-26 DIAGNOSIS — R69 Illness, unspecified: Secondary | ICD-10-CM | POA: Diagnosis not present

## 2022-07-26 DIAGNOSIS — F411 Generalized anxiety disorder: Secondary | ICD-10-CM | POA: Diagnosis not present

## 2022-07-26 DIAGNOSIS — F4310 Post-traumatic stress disorder, unspecified: Secondary | ICD-10-CM | POA: Diagnosis not present

## 2022-07-26 DIAGNOSIS — F341 Dysthymic disorder: Secondary | ICD-10-CM | POA: Diagnosis not present

## 2022-08-02 DIAGNOSIS — R69 Illness, unspecified: Secondary | ICD-10-CM | POA: Diagnosis not present

## 2022-08-02 DIAGNOSIS — F411 Generalized anxiety disorder: Secondary | ICD-10-CM | POA: Diagnosis not present

## 2022-08-02 DIAGNOSIS — F341 Dysthymic disorder: Secondary | ICD-10-CM | POA: Diagnosis not present

## 2022-08-09 DIAGNOSIS — F411 Generalized anxiety disorder: Secondary | ICD-10-CM | POA: Diagnosis not present

## 2022-08-09 DIAGNOSIS — F341 Dysthymic disorder: Secondary | ICD-10-CM | POA: Diagnosis not present

## 2022-08-09 DIAGNOSIS — R69 Illness, unspecified: Secondary | ICD-10-CM | POA: Diagnosis not present

## 2022-08-16 DIAGNOSIS — F411 Generalized anxiety disorder: Secondary | ICD-10-CM | POA: Diagnosis not present

## 2022-08-16 DIAGNOSIS — F341 Dysthymic disorder: Secondary | ICD-10-CM | POA: Diagnosis not present

## 2022-08-16 DIAGNOSIS — R69 Illness, unspecified: Secondary | ICD-10-CM | POA: Diagnosis not present

## 2022-08-17 DIAGNOSIS — I10 Essential (primary) hypertension: Secondary | ICD-10-CM | POA: Diagnosis not present

## 2022-08-17 DIAGNOSIS — R350 Frequency of micturition: Secondary | ICD-10-CM | POA: Diagnosis not present

## 2022-08-17 DIAGNOSIS — D692 Other nonthrombocytopenic purpura: Secondary | ICD-10-CM | POA: Diagnosis not present

## 2022-08-17 DIAGNOSIS — K219 Gastro-esophageal reflux disease without esophagitis: Secondary | ICD-10-CM | POA: Diagnosis not present

## 2022-08-17 DIAGNOSIS — Z79899 Other long term (current) drug therapy: Secondary | ICD-10-CM | POA: Diagnosis not present

## 2022-08-17 DIAGNOSIS — Z1211 Encounter for screening for malignant neoplasm of colon: Secondary | ICD-10-CM | POA: Diagnosis not present

## 2022-08-17 DIAGNOSIS — E559 Vitamin D deficiency, unspecified: Secondary | ICD-10-CM | POA: Diagnosis not present

## 2022-08-17 DIAGNOSIS — J309 Allergic rhinitis, unspecified: Secondary | ICD-10-CM | POA: Diagnosis not present

## 2022-08-17 DIAGNOSIS — G4700 Insomnia, unspecified: Secondary | ICD-10-CM | POA: Diagnosis not present

## 2022-08-17 DIAGNOSIS — Z0000 Encounter for general adult medical examination without abnormal findings: Secondary | ICD-10-CM | POA: Diagnosis not present

## 2022-08-17 DIAGNOSIS — R69 Illness, unspecified: Secondary | ICD-10-CM | POA: Diagnosis not present

## 2022-08-17 DIAGNOSIS — L219 Seborrheic dermatitis, unspecified: Secondary | ICD-10-CM | POA: Diagnosis not present

## 2022-08-22 DIAGNOSIS — R350 Frequency of micturition: Secondary | ICD-10-CM | POA: Diagnosis not present

## 2022-08-23 DIAGNOSIS — F341 Dysthymic disorder: Secondary | ICD-10-CM | POA: Diagnosis not present

## 2022-08-23 DIAGNOSIS — F411 Generalized anxiety disorder: Secondary | ICD-10-CM | POA: Diagnosis not present

## 2022-08-23 DIAGNOSIS — R69 Illness, unspecified: Secondary | ICD-10-CM | POA: Diagnosis not present

## 2022-08-25 DIAGNOSIS — F4310 Post-traumatic stress disorder, unspecified: Secondary | ICD-10-CM | POA: Diagnosis not present

## 2022-08-25 DIAGNOSIS — F331 Major depressive disorder, recurrent, moderate: Secondary | ICD-10-CM | POA: Diagnosis not present

## 2022-08-25 DIAGNOSIS — R69 Illness, unspecified: Secondary | ICD-10-CM | POA: Diagnosis not present

## 2022-08-25 DIAGNOSIS — F411 Generalized anxiety disorder: Secondary | ICD-10-CM | POA: Diagnosis not present

## 2022-08-30 DIAGNOSIS — F411 Generalized anxiety disorder: Secondary | ICD-10-CM | POA: Diagnosis not present

## 2022-08-30 DIAGNOSIS — R69 Illness, unspecified: Secondary | ICD-10-CM | POA: Diagnosis not present

## 2022-08-30 DIAGNOSIS — F341 Dysthymic disorder: Secondary | ICD-10-CM | POA: Diagnosis not present

## 2022-09-06 DIAGNOSIS — R051 Acute cough: Secondary | ICD-10-CM | POA: Diagnosis not present

## 2022-09-06 DIAGNOSIS — F341 Dysthymic disorder: Secondary | ICD-10-CM | POA: Diagnosis not present

## 2022-09-06 DIAGNOSIS — F411 Generalized anxiety disorder: Secondary | ICD-10-CM | POA: Diagnosis not present

## 2022-09-06 DIAGNOSIS — R69 Illness, unspecified: Secondary | ICD-10-CM | POA: Diagnosis not present

## 2022-09-08 DIAGNOSIS — H2513 Age-related nuclear cataract, bilateral: Secondary | ICD-10-CM | POA: Diagnosis not present

## 2022-09-08 DIAGNOSIS — H02831 Dermatochalasis of right upper eyelid: Secondary | ICD-10-CM | POA: Diagnosis not present

## 2022-09-08 DIAGNOSIS — H524 Presbyopia: Secondary | ICD-10-CM | POA: Diagnosis not present

## 2022-09-08 DIAGNOSIS — H11153 Pinguecula, bilateral: Secondary | ICD-10-CM | POA: Diagnosis not present

## 2022-09-08 DIAGNOSIS — H43813 Vitreous degeneration, bilateral: Secondary | ICD-10-CM | POA: Diagnosis not present

## 2022-09-13 DIAGNOSIS — R69 Illness, unspecified: Secondary | ICD-10-CM | POA: Diagnosis not present

## 2022-09-13 DIAGNOSIS — F411 Generalized anxiety disorder: Secondary | ICD-10-CM | POA: Diagnosis not present

## 2022-09-13 DIAGNOSIS — F341 Dysthymic disorder: Secondary | ICD-10-CM | POA: Diagnosis not present

## 2022-09-20 DIAGNOSIS — F341 Dysthymic disorder: Secondary | ICD-10-CM | POA: Diagnosis not present

## 2022-09-20 DIAGNOSIS — R69 Illness, unspecified: Secondary | ICD-10-CM | POA: Diagnosis not present

## 2022-09-20 DIAGNOSIS — F411 Generalized anxiety disorder: Secondary | ICD-10-CM | POA: Diagnosis not present

## 2022-09-23 DIAGNOSIS — F4310 Post-traumatic stress disorder, unspecified: Secondary | ICD-10-CM | POA: Diagnosis not present

## 2022-09-23 DIAGNOSIS — F411 Generalized anxiety disorder: Secondary | ICD-10-CM | POA: Diagnosis not present

## 2022-09-23 DIAGNOSIS — F331 Major depressive disorder, recurrent, moderate: Secondary | ICD-10-CM | POA: Diagnosis not present

## 2022-09-23 DIAGNOSIS — R69 Illness, unspecified: Secondary | ICD-10-CM | POA: Diagnosis not present

## 2022-10-04 DIAGNOSIS — F411 Generalized anxiety disorder: Secondary | ICD-10-CM | POA: Diagnosis not present

## 2022-10-04 DIAGNOSIS — F341 Dysthymic disorder: Secondary | ICD-10-CM | POA: Diagnosis not present

## 2022-10-04 DIAGNOSIS — R69 Illness, unspecified: Secondary | ICD-10-CM | POA: Diagnosis not present

## 2022-10-11 DIAGNOSIS — R69 Illness, unspecified: Secondary | ICD-10-CM | POA: Diagnosis not present

## 2022-10-11 DIAGNOSIS — F341 Dysthymic disorder: Secondary | ICD-10-CM | POA: Diagnosis not present

## 2022-10-11 DIAGNOSIS — F411 Generalized anxiety disorder: Secondary | ICD-10-CM | POA: Diagnosis not present

## 2022-10-16 DIAGNOSIS — U071 COVID-19: Secondary | ICD-10-CM | POA: Diagnosis not present

## 2022-10-16 DIAGNOSIS — R059 Cough, unspecified: Secondary | ICD-10-CM | POA: Diagnosis not present

## 2022-10-16 DIAGNOSIS — R5383 Other fatigue: Secondary | ICD-10-CM | POA: Diagnosis not present

## 2022-10-16 DIAGNOSIS — R0981 Nasal congestion: Secondary | ICD-10-CM | POA: Diagnosis not present

## 2022-10-18 DIAGNOSIS — R69 Illness, unspecified: Secondary | ICD-10-CM | POA: Diagnosis not present

## 2022-10-18 DIAGNOSIS — F411 Generalized anxiety disorder: Secondary | ICD-10-CM | POA: Diagnosis not present

## 2022-10-18 DIAGNOSIS — F341 Dysthymic disorder: Secondary | ICD-10-CM | POA: Diagnosis not present

## 2022-10-25 DIAGNOSIS — R69 Illness, unspecified: Secondary | ICD-10-CM | POA: Diagnosis not present

## 2022-10-25 DIAGNOSIS — F411 Generalized anxiety disorder: Secondary | ICD-10-CM | POA: Diagnosis not present

## 2022-10-25 DIAGNOSIS — F341 Dysthymic disorder: Secondary | ICD-10-CM | POA: Diagnosis not present

## 2022-11-01 DIAGNOSIS — F341 Dysthymic disorder: Secondary | ICD-10-CM | POA: Diagnosis not present

## 2022-11-01 DIAGNOSIS — F411 Generalized anxiety disorder: Secondary | ICD-10-CM | POA: Diagnosis not present

## 2022-11-01 DIAGNOSIS — R69 Illness, unspecified: Secondary | ICD-10-CM | POA: Diagnosis not present

## 2022-11-22 DIAGNOSIS — R69 Illness, unspecified: Secondary | ICD-10-CM | POA: Diagnosis not present

## 2022-11-22 DIAGNOSIS — F4310 Post-traumatic stress disorder, unspecified: Secondary | ICD-10-CM | POA: Diagnosis not present

## 2022-11-22 DIAGNOSIS — F331 Major depressive disorder, recurrent, moderate: Secondary | ICD-10-CM | POA: Diagnosis not present

## 2022-11-22 DIAGNOSIS — F411 Generalized anxiety disorder: Secondary | ICD-10-CM | POA: Diagnosis not present

## 2023-01-06 DIAGNOSIS — R69 Illness, unspecified: Secondary | ICD-10-CM | POA: Diagnosis not present

## 2023-01-06 DIAGNOSIS — F411 Generalized anxiety disorder: Secondary | ICD-10-CM | POA: Diagnosis not present

## 2023-01-06 DIAGNOSIS — F4310 Post-traumatic stress disorder, unspecified: Secondary | ICD-10-CM | POA: Diagnosis not present

## 2023-01-06 DIAGNOSIS — F331 Major depressive disorder, recurrent, moderate: Secondary | ICD-10-CM | POA: Diagnosis not present

## 2023-02-02 NOTE — Progress Notes (Deleted)
72 y.o. G67P2002 Married Caucasian female here for annual exam.    PCP:     Patient's last menstrual period was 10/14/2002 (approximate).           Sexually active: {yes no:314532}  The current method of family planning is post menopausal status.    Exercising: {yes no:314532}  {types:19826} Smoker:  former  Health Maintenance: Pap:  02/11/21 ASCUS: HR HPV neg, 12/19/17 neg: HR HPV neg History of abnormal Pap:  yes MMG:  06/16/22 Breast Density Category C, BI-RADS CATEGORY 1 neg Colonoscopy:  07/14/14 BMD:   06/12/20  Result  osteopenic TDaP:  10/14/14 Gardasil:   no HIV: 12/04/15 NR Hep C: 12/04/15 neg Screening Labs:  Hb today: ***, Urine today: ***   reports that she quit smoking about 18 years ago. Her smoking use included cigarettes. She has a 14.00 pack-year smoking history. She has never used smokeless tobacco. She reports current alcohol use. She reports that she does not use drugs.  Past Medical History:  Diagnosis Date   Abnormal Pap smear 1994   --[facility]   Allergic rhinitis    Anxiety    Dense breasts    Endocervical polyp 01/1993   -benign   Hypercholesterolemia    Hypertension    Osteopenia     Past Surgical History:  Procedure Laterality Date   CERVICAL BIOPSY  W/ LOOP ELECTRODE EXCISION  03/1993   [facility]   COLONOSCOPY  2016   endocervical polyp  01-27-93   -benign   TOOTH EXTRACTION  08/2017    Current Outpatient Medications  Medication Sig Dispense Refill   ARIPiprazole (ABILIFY) 2 MG tablet Take 2 mg by mouth daily.     atenolol (TENORMIN) 50 MG tablet Take 50 mg by mouth daily.      No current facility-administered medications for this visit.    Family History  Problem Relation Age of Onset   Stroke Father 66   Kidney disease Father    Hypertension Father    Hypertension Sister 99   Hyperlipidemia Sister    COPD Sister    Breast cancer Sister 69   Thyroid disease Mother 58   Heart failure Mother    Heart disease Mother    Hypertension Mother     COPD Mother    Heart disease Brother 45       AFIB   Suicidality Son 78       07/20/2013   Thyroid disease Sister 69    Review of Systems  Exam:   LMP 10/14/2002 (Approximate)     General appearance: alert, cooperative and appears stated age Head: normocephalic, without obvious abnormality, atraumatic Neck: no adenopathy, supple, symmetrical, trachea midline and thyroid normal to inspection and palpation Lungs: clear to auscultation bilaterally Breasts: normal appearance, no masses or tenderness, No nipple retraction or dimpling, No nipple discharge or bleeding, No axillary adenopathy Heart: regular rate and rhythm Abdomen: soft, non-tender; no masses, no organomegaly Extremities: extremities normal, atraumatic, no cyanosis or edema Skin: skin color, texture, turgor normal. No rashes or lesions Lymph nodes: cervical, supraclavicular, and axillary nodes normal. Neurologic: grossly normal  Pelvic: External genitalia:  no lesions              No abnormal inguinal nodes palpated.              Urethra:  normal appearing urethra with no masses, tenderness or lesions              Bartholins and Skenes:  normal                 Vagina: normal appearing vagina with normal color and discharge, no lesions              Cervix: no lesions              Pap taken: {yes no:314532} Bimanual Exam:  Uterus:  normal size, contour, position, consistency, mobility, non-tender              Adnexa: no mass, fullness, tenderness              Rectal exam: {yes no:314532}.  Confirms.              Anus:  normal sphincter tone, no lesions  Chaperone was present for exam:  ***  Assessment:   Well woman visit with gynecologic exam.   Plan: Mammogram screening discussed. Self breast awareness reviewed. Pap and HR HPV as above. Guidelines for Calcium, Vitamin D, regular exercise program including cardiovascular and weight bearing exercise.   Follow up annually and prn.   Additional counseling given.   {yes [id number]. _______ minutes face to face time of which over 50% was spent in counseling.    After visit summary provided.

## 2023-02-08 DIAGNOSIS — F411 Generalized anxiety disorder: Secondary | ICD-10-CM | POA: Diagnosis not present

## 2023-02-08 DIAGNOSIS — F4310 Post-traumatic stress disorder, unspecified: Secondary | ICD-10-CM | POA: Diagnosis not present

## 2023-02-08 DIAGNOSIS — F331 Major depressive disorder, recurrent, moderate: Secondary | ICD-10-CM | POA: Diagnosis not present

## 2023-02-08 DIAGNOSIS — R69 Illness, unspecified: Secondary | ICD-10-CM | POA: Diagnosis not present

## 2023-02-16 ENCOUNTER — Ambulatory Visit: Payer: Medicare HMO | Admitting: Obstetrics and Gynecology

## 2023-03-07 ENCOUNTER — Ambulatory Visit
Admission: RE | Admit: 2023-03-07 | Discharge: 2023-03-07 | Disposition: A | Discharge status: Home / Self Care | Payer: Medicare HMO | Source: Ambulatory Visit | Attending: Family Medicine | Admitting: Family Medicine

## 2023-03-07 ENCOUNTER — Other Ambulatory Visit: Payer: Self-pay | Admitting: Family Medicine

## 2023-03-07 DIAGNOSIS — R059 Cough, unspecified: Secondary | ICD-10-CM

## 2023-03-07 DIAGNOSIS — I517 Cardiomegaly: Secondary | ICD-10-CM | POA: Diagnosis not present

## 2023-03-07 DIAGNOSIS — J189 Pneumonia, unspecified organism: Secondary | ICD-10-CM | POA: Diagnosis not present

## 2023-03-07 DIAGNOSIS — Z8701 Personal history of pneumonia (recurrent): Secondary | ICD-10-CM | POA: Diagnosis not present

## 2023-03-07 DIAGNOSIS — M47814 Spondylosis without myelopathy or radiculopathy, thoracic region: Secondary | ICD-10-CM | POA: Diagnosis not present

## 2023-03-10 DIAGNOSIS — F331 Major depressive disorder, recurrent, moderate: Secondary | ICD-10-CM | POA: Diagnosis not present

## 2023-03-10 DIAGNOSIS — F4310 Post-traumatic stress disorder, unspecified: Secondary | ICD-10-CM | POA: Diagnosis not present

## 2023-03-10 DIAGNOSIS — J189 Pneumonia, unspecified organism: Secondary | ICD-10-CM | POA: Diagnosis not present

## 2023-03-10 DIAGNOSIS — F411 Generalized anxiety disorder: Secondary | ICD-10-CM | POA: Diagnosis not present

## 2023-03-15 NOTE — Progress Notes (Addendum)
72 y.o. G26P2002 Married Caucasian female here for annual exam.    No GYN concerns.  No vaginal bleeding or spotting.  Patient is followed for osteopenia.  Worried about her blood pressure.  Taking Atenolol 50 mg in am and 25 mg at night.   Taking Abilify for anxiety and mood.   Will see Rheumatology for possible rheumatoid arthritis.  PCP:   Dr. Paulino Rily.  Patient's last menstrual period was 10/14/2002 (approximate).           Sexually active: Yes.    The current method of family planning is post menopausal status/ vasectomy.    Exercising: Yes.     walking Smoker:  former  Health Maintenance: Pap:  02/11/21 ASCUS: HR HPV neg, 12/19/17 neg: HR HPV neg History of abnormal Pap:  yes, LEEP MMG:  06/16/22 Breast Density Cat C, BI-RADS CAT 1 neg Colonoscopy:  07/14/14 BMD:   06/12/20  Result  osteopenia of hip: T score -1.6. osteopenia of spine -1.2. TDaP:  10/14/14 Gardasil:   no HIV: neg 2017 Hep C: neg 2017 Screening Labs:  PCP   reports that she quit smoking about 18 years ago. Her smoking use included cigarettes. She has a 14.00 pack-year smoking history. She has never used smokeless tobacco. She reports current alcohol use. She reports that she does not use drugs.  Past Medical History:  Diagnosis Date   Abnormal Pap smear 1994   --[facility] I   Allergic rhinitis    Anxiety    Dense breasts    Endocervical polyp 01/1993   -benign   Hypercholesterolemia    Hypertension    Osteopenia     Past Surgical History:  Procedure Laterality Date   CERVICAL BIOPSY  W/ LOOP ELECTRODE EXCISION  03/1993   [facility]   COLONOSCOPY  2016   endocervical polyp  01-27-93   -benign   TOOTH EXTRACTION  08/2017    Current Outpatient Medications  Medication Sig Dispense Refill   ARIPiprazole (ABILIFY) 2 MG tablet Take 2 mg by mouth daily.     atenolol (TENORMIN) 50 MG tablet Take 50 mg by mouth daily.      No current facility-administered medications for this visit.    Family History   Problem Relation Age of Onset   Stroke Father 30   Kidney disease Father    Hypertension Father    Hypertension Sister 39   Hyperlipidemia Sister    COPD Sister    Breast cancer Sister 81   Thyroid disease Mother 42   Heart failure Mother    Heart disease Mother    Hypertension Mother    COPD Mother    Heart disease Brother 18       AFIB   Suicidality Son 50       07/20/2013   Thyroid disease Sister 69    Review of Systems  All other systems reviewed and are negative.   Exam:   BP 134/88 (BP Location: Left Arm, Patient Position: Sitting, Cuff Size: Normal)   Ht 5' 1.5" (1.562 m)   Wt 134 lb (60.8 kg)   LMP 10/14/2002 (Approximate)   BMI 24.91 kg/m     General appearance: alert, cooperative and appears stated age Head: normocephalic, without obvious abnormality, atraumatic Neck: no adenopathy, supple, symmetrical, trachea midline and thyroid normal to inspection and palpation Lungs: clear to auscultation bilaterally Breasts: normal appearance, no masses or tenderness, No nipple retraction or dimpling, No nipple discharge or bleeding, No axillary adenopathy Heart: regular rate  and rhythm Abdomen: soft, non-tender; no masses, no organomegaly Extremities: extremities normal, atraumatic, no cyanosis or edema Skin: skin color, texture, turgor normal. No rashes or lesions Lymph nodes: cervical, supraclavicular, and axillary nodes normal. Neurologic: grossly normal  Pelvic: External genitalia:  no lesions              No abnormal inguinal nodes palpated.              Urethra:  normal appearing urethra with no masses, tenderness or lesions              Bartholins and Skenes: normal                 Vagina: normal appearing vagina with normal color and discharge, no lesions              Cervix: no lesions              Pap taken: yes Bimanual Exam:  Uterus:  normal size, contour, position, consistency, mobility, non-tender              Adnexa: no mass, fullness, tenderness               Rectal exam: yes.  Confirms.              Anus:  normal sphincter tone, no lesions  Chaperone was present for exam:  Warren Lacy, CMA  Assessment:   Well woman visit with gynecologic exam. Cervical cancer screening.  Hx LEEP.  ASCUS pap and negative HR HPV. Osteopenia.  Menopausal female. FH breast cancer sister.  Plan: Mammogram screening discussed. Self breast awareness reviewed. Pap and HR HPV collected today.  Guidelines for Calcium, Vitamin D, regular exercise program including cardiovascular and weight bearing exercise. BMD [facility] at [facility].   Follow up in 2 years and prn.   After visit summary provided.   20 min  total time was spent for this patient encounter, including preparation, face-to-face counseling with the patient, coordination of care, and documentation of the encounter in addition to doing breast and pelvic exam.

## 2023-03-17 DIAGNOSIS — M1990 Unspecified osteoarthritis, unspecified site: Secondary | ICD-10-CM | POA: Diagnosis not present

## 2023-03-17 DIAGNOSIS — I10 Essential (primary) hypertension: Secondary | ICD-10-CM | POA: Diagnosis not present

## 2023-03-28 ENCOUNTER — Other Ambulatory Visit: Payer: Self-pay | Admitting: Family Medicine

## 2023-03-28 DIAGNOSIS — F331 Major depressive disorder, recurrent, moderate: Secondary | ICD-10-CM | POA: Diagnosis not present

## 2023-03-28 DIAGNOSIS — I10 Essential (primary) hypertension: Secondary | ICD-10-CM | POA: Diagnosis not present

## 2023-03-28 DIAGNOSIS — M069 Rheumatoid arthritis, unspecified: Secondary | ICD-10-CM | POA: Diagnosis not present

## 2023-03-29 ENCOUNTER — Ambulatory Visit (INDEPENDENT_AMBULATORY_CARE_PROVIDER_SITE_OTHER): Payer: Medicare HMO | Admitting: Obstetrics and Gynecology

## 2023-03-29 ENCOUNTER — Encounter: Payer: Self-pay | Admitting: Obstetrics and Gynecology

## 2023-03-29 ENCOUNTER — Other Ambulatory Visit (HOSPITAL_COMMUNITY)
Admission: RE | Admit: 2023-03-29 | Discharge: 2023-03-29 | Disposition: A | Discharge status: Home / Self Care | Payer: Medicare HMO | Source: Ambulatory Visit | Attending: Obstetrics and Gynecology | Admitting: Obstetrics and Gynecology

## 2023-03-29 VITALS — BP 134/88 | Ht 61.5 in | Wt 134.0 lb

## 2023-03-29 DIAGNOSIS — Z01419 Encounter for gynecological examination (general) (routine) without abnormal findings: Secondary | ICD-10-CM

## 2023-03-29 DIAGNOSIS — Z8742 Personal history of other diseases of the female genital tract: Secondary | ICD-10-CM

## 2023-03-29 DIAGNOSIS — Z780 Asymptomatic menopausal state: Secondary | ICD-10-CM | POA: Diagnosis not present

## 2023-03-29 DIAGNOSIS — M8580 Other specified disorders of bone density and structure, unspecified site: Secondary | ICD-10-CM

## 2023-03-29 DIAGNOSIS — Z124 Encounter for screening for malignant neoplasm of cervix: Secondary | ICD-10-CM | POA: Diagnosis not present

## 2023-03-29 DIAGNOSIS — Z9189 Other specified personal risk factors, not elsewhere classified: Secondary | ICD-10-CM | POA: Diagnosis not present

## 2023-03-29 DIAGNOSIS — Z1151 Encounter for screening for human papillomavirus (HPV): Secondary | ICD-10-CM | POA: Diagnosis not present

## 2023-03-29 NOTE — Patient Instructions (Signed)
EXERCISE AND DIET:  We recommended that you start or continue a regular exercise program for good health. Regular exercise means any activity that makes your heart beat faster and makes you sweat.  We recommend exercising at least 30 minutes per day at least 3 days a week, preferably 4 or 5.  We also recommend a diet low in fat and sugar.  Inactivity, poor dietary choices and obesity can cause diabetes, heart attack, stroke, and kidney damage, among others.    ALCOHOL AND SMOKING:  Women should limit their alcohol intake to no more than 7 drinks/beers/glasses of wine (combined, not each!) per week. Moderation of alcohol intake to this level decreases your risk of breast cancer and liver damage. And of course, no recreational drugs are part of a healthy lifestyle.  And absolutely no smoking or even second hand smoke. Most people know smoking can cause heart and lung diseases, but did you know it also contributes to weakening of your bones? Aging of your skin?  Yellowing of your teeth and nails?  CALCIUM AND VITAMIN D:  Adequate intake of calcium and Vitamin D are recommended.  The recommendations for exact amounts of these supplements seem to change often, but generally speaking 600 mg of calcium (either carbonate or citrate) and 800 units of Vitamin D per day seems prudent. Certain women may benefit from higher intake of Vitamin D.  If you are among these women, your doctor will have told you during your visit.    PAP SMEARS:  Pap smears, to check for cervical cancer or precancers,  have traditionally been done yearly, although recent scientific advances have shown that most women can have pap smears less often.  However, every woman still should have a physical exam from her gynecologist every year. It will include a breast check, inspection of the vulva and vagina to check for abnormal growths or skin changes, a visual exam of the cervix, and then an exam to evaluate the size and shape of the uterus and  ovaries.  And after 72 years of age, a rectal exam is indicated to check for rectal cancers. We will also provide age appropriate advice regarding health maintenance, like when you should have certain vaccines, screening for sexually transmitted diseases, bone density testing, colonoscopy, mammograms, etc.   MAMMOGRAMS:  All women over 40 years old should have a yearly mammogram. Many facilities now offer a "3D" mammogram, which may cost around $50 extra out of pocket. If possible,  we recommend you accept the option to have the 3D mammogram performed.  It both reduces the number of women who will be called back for extra views which then turn out to be normal, and it is better than the routine mammogram at detecting truly abnormal areas.    COLONOSCOPY:  Colonoscopy to screen for colon cancer is recommended for all women at age 50.  We know, you hate the idea of the prep.  We agree, BUT, having colon cancer and not knowing it is worse!!  Colon cancer so often starts as a polyp that can be seen and removed at colonscopy, which can quite literally save your life!  And if your first colonoscopy is normal and you have no family history of colon cancer, most women don't have to have it again for 10 years.  Once every ten years, you can do something that may end up saving your life, right?  We will be happy to help you get it scheduled when you are ready.    Be sure to check your insurance coverage so you understand how much it will cost.  It may be covered as a preventative service at no cost, but you should check your particular policy.    Calcium Content in Foods Calcium is the most abundant mineral in the body. Most of the body's calcium supply is stored in bones and teeth. Calcium helps many parts of the body function normally, including: Blood and blood vessels. Nerves. Hormones. Muscles. Bones and teeth. When your calcium stores are low, you may be at risk for low bone mass, bone loss, and broken bones  (fractures). When you get enough calcium, it helps to support strong bones and teeth throughout your life. Calcium is especially important for: Children during growth spurts. Girls during adolescence. Women who are pregnant or breastfeeding. Women after their menstrual cycle stops (postmenopause). Women whose menstrual cycle has stopped due to anorexia nervosa or regular intense exercise. People who cannot eat or digest dairy products. Vegans. Recommended daily amounts of calcium: Women (ages 19 to 50): 1,000 mg per day. Women (ages 51 and older): 1,200 mg per day. Men (ages 19 to 70): 1,000 mg per day. Men (ages 71 and older): 1,200 mg per day. Women (ages 9 to 18): 1,300 mg per day. Men (ages 9 to 18): 1,300 mg per day. General information Eat foods that are high in calcium. Try to get most of your calcium from food. Some people may benefit from taking calcium supplements. Check with your health care provider or diet and nutrition specialist (dietitian) before starting any calcium supplements. Calcium supplements may interact with certain medicines. Too much calcium may cause other health problems, such as constipation and kidney stones. For the body to absorb calcium, it needs vitamin D. Sources of vitamin D include: Skin exposure to direct sunlight. Foods, such as egg yolks, liver, mushrooms, saltwater fish, and fortified milk. Vitamin D supplements. Check with your health care provider or dietitian before starting any vitamin D supplements. What foods are high in calcium?  Foods that are high in calcium contain more than 100 milligrams per serving. Fruits Fortified orange juice or other fruit juice, 300 mg per 8 oz serving. Vegetables Collard greens, 360 mg per 8 oz serving. Kale, 100 mg per 8 oz serving. Bok choy, 160 mg per 8 oz serving. Grains Fortified ready-to-eat cereals, 100 to 1,000 mg per 8 oz serving. Fortified frozen waffles, 200 mg in 2 waffles. Oatmeal, 140 mg in  1 cup. Meats and other proteins Sardines, canned with bones, 325 mg per 3 oz serving. Salmon, canned with bones, 180 mg per 3 oz serving. Canned shrimp, 125 mg per 3 oz serving. Baked beans, 160 mg per 4 oz serving. Tofu, firm, made with calcium sulfate, 253 mg per 4 oz serving. Dairy Yogurt, plain, low-fat, 310 mg per 6 oz serving. Nonfat milk, 300 mg per 8 oz serving. American cheese, 195 mg per 1 oz serving. Cheddar cheese, 205 mg per 1 oz serving. Cottage cheese 2%, 105 mg per 4 oz serving. Fortified soy, rice, or almond milk, 300 mg per 8 oz serving. Mozzarella, part skim, 210 mg per 1 oz serving. The items listed above may not be a complete list of foods high in calcium. Actual amounts of calcium may be different depending on processing. Contact a dietitian for more information. What foods are lower in calcium? Foods that are lower in calcium contain 50 mg or less per serving. Fruits Apple, about 6 mg. Banana, about 12 mg.   Vegetables Lettuce, 19 mg per 2 oz serving. Tomato, about 11 mg. Grains Rice, 4 mg per 6 oz serving. Boiled potatoes, 14 mg per 8 oz serving. White bread, 6 mg per slice. Meats and other proteins Egg, 27 mg per 2 oz serving. Red meat, 7 mg per 4 oz serving. Chicken, 17 mg per 4 oz serving. Fish, cod, or trout, 20 mg per 4 oz serving. Dairy Cream cheese, regular, 14 mg per 1 Tbsp serving. Brie cheese, 50 mg per 1 oz serving. Parmesan cheese, 70 mg per 1 Tbsp serving. The items listed above may not be a complete list of foods lower in calcium. Actual amounts of calcium may be different depending on processing. Contact a dietitian for more information. Summary Calcium is an important mineral in the body because it affects many functions. Getting enough calcium helps support strong bones and teeth throughout your life. Try to get most of your calcium from food. Calcium supplements may interact with certain medicines. Check with your health care provider  or dietitian before starting any calcium supplements. This information is not intended to replace advice given to you by your health care provider. Make sure you discuss any questions you have with your health care provider. Document Revised: 02/26/2020 Document Reviewed: 02/26/2020 [employer]vier Patient Education  2023 [employer]  

## 2023-03-30 ENCOUNTER — Other Ambulatory Visit: Payer: Self-pay | Admitting: Family Medicine

## 2023-03-30 DIAGNOSIS — Z1231 Encounter for screening mammogram for malignant neoplasm of breast: Secondary | ICD-10-CM

## 2023-03-30 DIAGNOSIS — I10 Essential (primary) hypertension: Secondary | ICD-10-CM | POA: Diagnosis not present

## 2023-04-03 LAB — CYTOLOGY - PAP
Comment: NEGATIVE
Diagnosis: NEGATIVE
High risk HPV: NEGATIVE

## 2023-04-12 DIAGNOSIS — M1991 Primary osteoarthritis, unspecified site: Secondary | ICD-10-CM | POA: Diagnosis not present

## 2023-04-12 DIAGNOSIS — R768 Other specified abnormal immunological findings in serum: Secondary | ICD-10-CM | POA: Diagnosis not present

## 2023-04-12 DIAGNOSIS — F411 Generalized anxiety disorder: Secondary | ICD-10-CM | POA: Diagnosis not present

## 2023-04-12 DIAGNOSIS — Z6824 Body mass index (BMI) 24.0-24.9, adult: Secondary | ICD-10-CM | POA: Diagnosis not present

## 2023-04-12 DIAGNOSIS — F4310 Post-traumatic stress disorder, unspecified: Secondary | ICD-10-CM | POA: Diagnosis not present

## 2023-04-12 DIAGNOSIS — M79642 Pain in left hand: Secondary | ICD-10-CM | POA: Diagnosis not present

## 2023-04-12 DIAGNOSIS — F331 Major depressive disorder, recurrent, moderate: Secondary | ICD-10-CM | POA: Diagnosis not present

## 2023-04-12 DIAGNOSIS — M79641 Pain in right hand: Secondary | ICD-10-CM | POA: Diagnosis not present

## 2023-05-02 ENCOUNTER — Ambulatory Visit
Admission: RE | Admit: 2023-05-02 | Discharge: 2023-05-02 | Disposition: A | Discharge status: Home / Self Care | Payer: Medicare HMO | Source: Ambulatory Visit | Attending: Family Medicine | Admitting: Family Medicine

## 2023-05-02 ENCOUNTER — Other Ambulatory Visit: Payer: Medicare HMO

## 2023-05-02 DIAGNOSIS — I10 Essential (primary) hypertension: Secondary | ICD-10-CM

## 2023-05-02 DIAGNOSIS — I700 Atherosclerosis of aorta: Secondary | ICD-10-CM | POA: Diagnosis not present

## 2023-05-22 ENCOUNTER — Encounter (HOSPITAL_BASED_OUTPATIENT_CLINIC_OR_DEPARTMENT_OTHER): Payer: Self-pay

## 2023-05-24 DIAGNOSIS — F411 Generalized anxiety disorder: Secondary | ICD-10-CM | POA: Diagnosis not present

## 2023-05-24 DIAGNOSIS — F4310 Post-traumatic stress disorder, unspecified: Secondary | ICD-10-CM | POA: Diagnosis not present

## 2023-05-24 DIAGNOSIS — F331 Major depressive disorder, recurrent, moderate: Secondary | ICD-10-CM | POA: Diagnosis not present

## 2023-06-01 ENCOUNTER — Institutional Professional Consult (permissible substitution) (HOSPITAL_BASED_OUTPATIENT_CLINIC_OR_DEPARTMENT_OTHER): Payer: Medicare HMO | Admitting: Family

## 2023-06-08 DIAGNOSIS — M549 Dorsalgia, unspecified: Secondary | ICD-10-CM | POA: Diagnosis not present

## 2023-06-14 DIAGNOSIS — M1991 Primary osteoarthritis, unspecified site: Secondary | ICD-10-CM | POA: Diagnosis not present

## 2023-06-14 DIAGNOSIS — Z6824 Body mass index (BMI) 24.0-24.9, adult: Secondary | ICD-10-CM | POA: Diagnosis not present

## 2023-06-14 DIAGNOSIS — M79641 Pain in right hand: Secondary | ICD-10-CM | POA: Diagnosis not present

## 2023-06-14 DIAGNOSIS — M79642 Pain in left hand: Secondary | ICD-10-CM | POA: Diagnosis not present

## 2023-06-14 DIAGNOSIS — R768 Other specified abnormal immunological findings in serum: Secondary | ICD-10-CM | POA: Diagnosis not present

## 2023-06-19 ENCOUNTER — Ambulatory Visit: Payer: Medicare HMO

## 2023-06-20 ENCOUNTER — Encounter (HOSPITAL_BASED_OUTPATIENT_CLINIC_OR_DEPARTMENT_OTHER): Payer: Self-pay

## 2023-06-28 ENCOUNTER — Ambulatory Visit: Payer: Medicare HMO

## 2023-06-28 DIAGNOSIS — F411 Generalized anxiety disorder: Secondary | ICD-10-CM | POA: Diagnosis not present

## 2023-06-28 DIAGNOSIS — F331 Major depressive disorder, recurrent, moderate: Secondary | ICD-10-CM | POA: Diagnosis not present

## 2023-06-28 DIAGNOSIS — F4310 Post-traumatic stress disorder, unspecified: Secondary | ICD-10-CM | POA: Diagnosis not present

## 2023-06-29 ENCOUNTER — Ambulatory Visit
Admission: RE | Admit: 2023-06-29 | Discharge: 2023-06-29 | Disposition: A | Discharge status: Home / Self Care | Payer: Medicare HMO | Source: Ambulatory Visit | Attending: Family Medicine | Admitting: Family Medicine

## 2023-06-29 DIAGNOSIS — Z1231 Encounter for screening mammogram for malignant neoplasm of breast: Secondary | ICD-10-CM | POA: Diagnosis not present

## 2023-07-11 DIAGNOSIS — F411 Generalized anxiety disorder: Secondary | ICD-10-CM | POA: Diagnosis not present

## 2023-07-11 DIAGNOSIS — F341 Dysthymic disorder: Secondary | ICD-10-CM | POA: Diagnosis not present

## 2023-07-18 DIAGNOSIS — F411 Generalized anxiety disorder: Secondary | ICD-10-CM | POA: Diagnosis not present

## 2023-07-18 DIAGNOSIS — F341 Dysthymic disorder: Secondary | ICD-10-CM | POA: Diagnosis not present

## 2023-07-25 DIAGNOSIS — F411 Generalized anxiety disorder: Secondary | ICD-10-CM | POA: Diagnosis not present

## 2023-07-25 DIAGNOSIS — F341 Dysthymic disorder: Secondary | ICD-10-CM | POA: Diagnosis not present

## 2023-07-28 DIAGNOSIS — F331 Major depressive disorder, recurrent, moderate: Secondary | ICD-10-CM | POA: Diagnosis not present

## 2023-07-28 DIAGNOSIS — F4310 Post-traumatic stress disorder, unspecified: Secondary | ICD-10-CM | POA: Diagnosis not present

## 2023-07-28 DIAGNOSIS — F411 Generalized anxiety disorder: Secondary | ICD-10-CM | POA: Diagnosis not present

## 2023-08-08 DIAGNOSIS — F341 Dysthymic disorder: Secondary | ICD-10-CM | POA: Diagnosis not present

## 2023-08-08 DIAGNOSIS — F411 Generalized anxiety disorder: Secondary | ICD-10-CM | POA: Diagnosis not present

## 2023-08-15 DIAGNOSIS — F411 Generalized anxiety disorder: Secondary | ICD-10-CM | POA: Diagnosis not present

## 2023-08-15 DIAGNOSIS — F341 Dysthymic disorder: Secondary | ICD-10-CM | POA: Diagnosis not present

## 2023-08-21 DIAGNOSIS — F411 Generalized anxiety disorder: Secondary | ICD-10-CM | POA: Diagnosis not present

## 2023-08-21 DIAGNOSIS — F341 Dysthymic disorder: Secondary | ICD-10-CM | POA: Diagnosis not present

## 2023-08-28 DIAGNOSIS — E559 Vitamin D deficiency, unspecified: Secondary | ICD-10-CM | POA: Diagnosis not present

## 2023-08-28 DIAGNOSIS — Z79899 Other long term (current) drug therapy: Secondary | ICD-10-CM | POA: Diagnosis not present

## 2023-08-28 DIAGNOSIS — I10 Essential (primary) hypertension: Secondary | ICD-10-CM | POA: Diagnosis not present

## 2023-08-29 DIAGNOSIS — F341 Dysthymic disorder: Secondary | ICD-10-CM | POA: Diagnosis not present

## 2023-08-29 DIAGNOSIS — F411 Generalized anxiety disorder: Secondary | ICD-10-CM | POA: Diagnosis not present

## 2023-09-05 DIAGNOSIS — F411 Generalized anxiety disorder: Secondary | ICD-10-CM | POA: Diagnosis not present

## 2023-09-05 DIAGNOSIS — F341 Dysthymic disorder: Secondary | ICD-10-CM | POA: Diagnosis not present

## 2023-09-12 DIAGNOSIS — F411 Generalized anxiety disorder: Secondary | ICD-10-CM | POA: Diagnosis not present

## 2023-09-12 DIAGNOSIS — F341 Dysthymic disorder: Secondary | ICD-10-CM | POA: Diagnosis not present

## 2023-09-19 DIAGNOSIS — F411 Generalized anxiety disorder: Secondary | ICD-10-CM | POA: Diagnosis not present

## 2023-09-19 DIAGNOSIS — F341 Dysthymic disorder: Secondary | ICD-10-CM | POA: Diagnosis not present

## 2023-09-26 DIAGNOSIS — F411 Generalized anxiety disorder: Secondary | ICD-10-CM | POA: Diagnosis not present

## 2023-09-26 DIAGNOSIS — F4310 Post-traumatic stress disorder, unspecified: Secondary | ICD-10-CM | POA: Diagnosis not present

## 2023-09-26 DIAGNOSIS — F341 Dysthymic disorder: Secondary | ICD-10-CM | POA: Diagnosis not present

## 2023-09-26 DIAGNOSIS — F331 Major depressive disorder, recurrent, moderate: Secondary | ICD-10-CM | POA: Diagnosis not present

## 2023-09-27 DIAGNOSIS — L723 Sebaceous cyst: Secondary | ICD-10-CM | POA: Diagnosis not present

## 2023-10-05 DIAGNOSIS — L821 Other seborrheic keratosis: Secondary | ICD-10-CM | POA: Diagnosis not present

## 2023-10-05 DIAGNOSIS — L728 Other follicular cysts of the skin and subcutaneous tissue: Secondary | ICD-10-CM | POA: Diagnosis not present

## 2023-10-05 DIAGNOSIS — L814 Other melanin hyperpigmentation: Secondary | ICD-10-CM | POA: Diagnosis not present

## 2023-10-05 DIAGNOSIS — D225 Melanocytic nevi of trunk: Secondary | ICD-10-CM | POA: Diagnosis not present

## 2023-10-11 DIAGNOSIS — F341 Dysthymic disorder: Secondary | ICD-10-CM | POA: Diagnosis not present

## 2023-10-11 DIAGNOSIS — F411 Generalized anxiety disorder: Secondary | ICD-10-CM | POA: Diagnosis not present

## 2023-10-17 DIAGNOSIS — F411 Generalized anxiety disorder: Secondary | ICD-10-CM | POA: Diagnosis not present

## 2023-10-17 DIAGNOSIS — F341 Dysthymic disorder: Secondary | ICD-10-CM | POA: Diagnosis not present

## 2023-10-24 ENCOUNTER — Inpatient Hospital Stay
Admission: RE | Admit: 2023-10-24 | Discharge: 2023-10-24 | Disposition: A | Discharge status: Home / Self Care | Payer: Medicare HMO | Source: Ambulatory Visit | Attending: Obstetrics and Gynecology | Admitting: Obstetrics and Gynecology

## 2023-10-24 DIAGNOSIS — Z780 Asymptomatic menopausal state: Secondary | ICD-10-CM

## 2023-10-24 DIAGNOSIS — E2839 Other primary ovarian failure: Secondary | ICD-10-CM | POA: Diagnosis not present

## 2023-10-24 DIAGNOSIS — M8588 Other specified disorders of bone density and structure, other site: Secondary | ICD-10-CM | POA: Diagnosis not present

## 2023-10-24 DIAGNOSIS — Z8262 Family history of osteoporosis: Secondary | ICD-10-CM | POA: Diagnosis not present

## 2023-10-24 DIAGNOSIS — M8580 Other specified disorders of bone density and structure, unspecified site: Secondary | ICD-10-CM

## 2023-11-21 DIAGNOSIS — F341 Dysthymic disorder: Secondary | ICD-10-CM | POA: Diagnosis not present

## 2023-11-21 DIAGNOSIS — F411 Generalized anxiety disorder: Secondary | ICD-10-CM | POA: Diagnosis not present

## 2023-11-23 DIAGNOSIS — I10 Essential (primary) hypertension: Secondary | ICD-10-CM | POA: Diagnosis not present

## 2023-11-23 DIAGNOSIS — J029 Acute pharyngitis, unspecified: Secondary | ICD-10-CM | POA: Diagnosis not present

## 2023-11-23 DIAGNOSIS — J40 Bronchitis, not specified as acute or chronic: Secondary | ICD-10-CM | POA: Diagnosis not present

## 2023-11-27 DIAGNOSIS — F331 Major depressive disorder, recurrent, moderate: Secondary | ICD-10-CM | POA: Diagnosis not present

## 2023-11-27 DIAGNOSIS — F411 Generalized anxiety disorder: Secondary | ICD-10-CM | POA: Diagnosis not present

## 2023-11-27 DIAGNOSIS — F4310 Post-traumatic stress disorder, unspecified: Secondary | ICD-10-CM | POA: Diagnosis not present

## 2023-12-05 DIAGNOSIS — F341 Dysthymic disorder: Secondary | ICD-10-CM | POA: Diagnosis not present

## 2023-12-05 DIAGNOSIS — F411 Generalized anxiety disorder: Secondary | ICD-10-CM | POA: Diagnosis not present

## 2023-12-12 DIAGNOSIS — F341 Dysthymic disorder: Secondary | ICD-10-CM | POA: Diagnosis not present

## 2023-12-12 DIAGNOSIS — F411 Generalized anxiety disorder: Secondary | ICD-10-CM | POA: Diagnosis not present

## 2023-12-19 DIAGNOSIS — F341 Dysthymic disorder: Secondary | ICD-10-CM | POA: Diagnosis not present

## 2023-12-19 DIAGNOSIS — F411 Generalized anxiety disorder: Secondary | ICD-10-CM | POA: Diagnosis not present

## 2023-12-21 DIAGNOSIS — R051 Acute cough: Secondary | ICD-10-CM | POA: Diagnosis not present

## 2023-12-21 DIAGNOSIS — I499 Cardiac arrhythmia, unspecified: Secondary | ICD-10-CM | POA: Diagnosis not present

## 2023-12-21 NOTE — Progress Notes (Signed)
 Cardiology Office Note:    Date:  12/23/2023   ID:  Destiny Elliott, DOB [birth year], MRN [id number]  PCP:  Verena Mems, MD  Cardiologist:  None  Electrophysiologist:  None   Referring MD: Verena Mems, MD   Chief Complaint  Patient presents with   Palpitations    History of Present Illness:    Destiny Elliott is a 73 y.o. female with a hx of hypertension, hyperlipidemia who is referred by Dr. Chrystal for evaluation of palpitations.  She reports she has had URI symptoms recently and went to see PCP yesterday.  Reports was told that her heart rate was irregular.  She reports having palpitations about once per week.  She denies any exertional chest pain or dyspnea.  No lightheadedness or syncope.  Denies any lower extremity edema.  She smoked for 30 years, about 0.5 packs/day, quit in 2000.  Family history includes mother had cardiac disease but she is unclear about details.  Reports BP has been well-controlled when she checks at home.   Past Medical History:  Diagnosis Date   Abnormal Pap smear 1994   --CIN I   Allergic rhinitis    Anxiety    Dense breasts    Endocervical polyp 01/1993   -benign   Hypercholesterolemia    Hypertension    Osteopenia     Past Surgical History:  Procedure Laterality Date   CERVICAL BIOPSY  W/ LOOP ELECTRODE EXCISION  03/1993   CIN I   COLONOSCOPY  2016   endocervical polyp  01-27-93   -benign   TOOTH EXTRACTION  08/2017    Current Medications: Current Meds  Medication Sig   albuterol  (VENTOLIN  HFA) 108 (90 Base) MCG/ACT inhaler Inhale 2 puffs into the lungs every 4 (four) hours as needed for wheezing or shortness of breath.   atenolol  (TENORMIN ) 50 MG tablet Take 50 mg by mouth daily.    loratadine (CLARITIN) 10 MG tablet Take 10 mg by mouth daily.   meloxicam (MOBIC) 15 MG tablet Take 15 mg by mouth daily.   sodium chloride  (OCEAN) 0.65 % SOLN nasal spray Place into both nostrils daily.     Allergies:   Ambien [zolpidem  tartrate], Amlodipine besylate, Benadryl  [diphenhydramine  hcl], Buspar [buspirone], Flonase [fluticasone propionate], Gabapentin, Nickel, Nortriptyline, Other, Paxil [paroxetine hcl], Remeron [mirtazapine], Trazodone  hcl, Tylenol  pm extra [diphenhydramine -apap (sleep)], Vitamin d  analogs, and Zoloft [sertraline hcl]   Social History   Socioeconomic History   Marital status: Married    Spouse name: Todd   Number of children: 2   Years of education: 12   Highest education level: Not on file  Occupational History   Occupation: [employer]    Comment: self employed  Tobacco Use   Smoking status: Former    Current packs/day: 0.00    Average packs/day: 0.5 packs/day for 28.0 years (14.0 ttl pk-yrs)    Types: Cigarettes    Start date: 07/01/1976    Quit date: 07/01/2004    Years since quitting: 19.4   Smokeless tobacco: Never  Vaping Use   Vaping status: Never Used  Substance and Sexual Activity   Alcohol use: Yes    Comment: occ wine   Drug use: No   Sexual activity: Yes    Partners: Male    Birth control/protection: Post-menopausal    Comment: married  Other Topics Concern   Not on file  Social History Narrative   Lives with Krystal, one child deceased   Caffeine- 2 cups of tea  Social Drivers of Corporate Investment Banker Strain: Not on file  Food Insecurity: Not on file  Transportation Needs: Not on file  Physical Activity: Not on file  Stress: Not on file  Social Connections: Not on file     Family History: The patient's family history includes Breast cancer (age of onset: 66) in her sister; COPD in her mother and sister; Heart disease in her mother; Heart disease (age of onset: 41) in her brother; Heart failure in her mother; Hyperlipidemia in her sister; Hypertension in her father and mother; Hypertension (age of onset: 77) in her sister; Kidney disease in her father; Stroke (age of onset: 75) in her father; Suicidality (age of onset: 50) in her son; Thyroid   disease (age of onset: 30) in her sister; Thyroid  disease (age of onset: 43) in her mother.  ROS:   Please see the history of present illness.     All other systems reviewed and are negative.  EKGs/Labs/Other Studies Reviewed:    The following studies were reviewed today:   EKG:   12/22/2023: Sinus rhythm with PACs/PVCs.  Rate 64, QTc 453  Recent Labs: 12/22/2023: BUN 10; Creatinine, Ser 0.74; Magnesium  2.3; Potassium 4.0; Sodium 143; TSH 1.710  Recent Lipid Panel    Component Value Date/Time   CHOL 163 03/21/2018 0643   TRIG 77 03/21/2018 0643   HDL 49 03/21/2018 0643   CHOLHDL 3.3 03/21/2018 0643   VLDL 15 03/21/2018 0643   LDLCALC 99 03/21/2018 0643    Physical Exam:    VS:  BP (!) 144/74   Pulse 64   Ht 5' 2 (1.575 m)   Wt 130 lb 6.4 oz (59.1 kg)   LMP 10/14/2002 (Approximate)   SpO2 97%   BMI 23.85 kg/m     Wt Readings from Last 3 Encounters:  12/22/23 130 lb 6.4 oz (59.1 kg)  03/29/23 134 lb (60.8 kg)  02/11/21 127 lb (57.6 kg)     GEN:  Well nourished, well developed in no acute distress HEENT: Normal NECK: No JVD; No carotid bruits LYMPHATICS: No lymphadenopathy CARDIAC: RRR, no murmurs, rubs, gallops RESPIRATORY:  Clear to auscultation without rales, wheezing or rhonchi  ABDOMEN: Soft, non-tender, non-distended MUSCULOSKELETAL:  No edema; No deformity  SKIN: Warm and dry NEUROLOGIC:  Alert and oriented x 3 PSYCHIATRIC:  Normal affect   ASSESSMENT:    1. Palpitations   2. Hypertension, unspecified type   3. Hyperlipidemia, unspecified hyperlipidemia type    PLAN:    Palpitations: EKG with frequent PACs/PVCs.  Check Zio patch x 7 days to quantify PAC/PVC burden.  Check echocardiogram to evaluate for structural heart disease.  Check BMET, magnesium , TSH  Hypertension: On atenolol  50 mg daily.  BP elevated in clinic but reports under good control at home.  Asked to check BP twice daily for next week and let us  know results  Hyperlipidemia: LDL 106  on 08/28/2023, will monitor  RTC in 4 months   Medication Adjustments/Labs and Tests Ordered: Current medicines are reviewed at length with the patient today.  Concerns regarding medicines are outlined above.  Orders Placed This Encounter  Procedures   Basic Metabolic Panel (BMET)   Magnesium    TSH   LONG TERM MONITOR (3-14 DAYS)   EKG 12-Lead   EKG 12-Lead   ECHOCARDIOGRAM COMPLETE   No orders of the defined types were placed in this encounter.   Patient Instructions  Medication Instructions:  Continue current medications *If you need a refill on  your cardiac medications before your next appointment, please call your pharmacy*   Lab Work: Bmet, mg, tsh today If you have labs (blood work) drawn today and your tests are completely normal, you will receive your results only by: MyChart Message (if you have MyChart) OR A paper copy in the mail If you have any lab test that is abnormal or we need to change your treatment, we will call you to review the results.   Testing/Procedures: Zio  ZIO XT- Long Term Monitor Instructions  Your physician has requested you wear a ZIO patch monitor for 7 days.  This is a single patch monitor. [employer] supplies one patch monitor per enrollment. Additional stickers are not available. Please do not apply patch if you will be having a Nuclear Stress Test,  Echocardiogram, Cardiac CT, MRI, or Chest Xray during the period you would be wearing the  monitor. The patch cannot be worn during these tests. You cannot remove and re-apply the  ZIO XT patch monitor.  Your ZIO patch monitor will be mailed 3 day USPS to your address on file. It may take 3-5 days  to receive your monitor after you have been enrolled.  Once you have received your monitor, please review the enclosed instructions. Your monitor  has already been registered assigning a specific monitor serial # to you.  Billing and Patient Assistance Program Information  We have supplied  [employer] with any of your insurance information on file for billing purposes. [employer] offers a sliding scale Patient Assistance Program for patients that do not have  insurance, or whose insurance does not completely cover the cost of the ZIO monitor.  You must apply for the Patient Assistance Program to qualify for this discounted rate.  To apply, please call [employer] at [phone], select option 4, select option 2, ask to apply for  Patient Assistance Program. Meredeth will ask your household income, and how many people  are in your household. They will quote your out-of-pocket cost based on that information.  [employer] will also be able to set up a 52-month, interest-free payment plan if needed.  Applying the monitor   Shave hair from upper left chest.  Hold abrader disc by orange tab. Rub abrader in 40 strokes over the upper left chest as  indicated in your monitor instructions.  Clean area with 4 enclosed alcohol pads. Let dry.  Apply patch as indicated in monitor instructions. Patch will be placed under collarbone on left  side of chest with arrow pointing upward.  Rub patch adhesive wings for 2 minutes. Remove white label marked 1. Remove the white  label marked 2. Rub patch adhesive wings for 2 additional minutes.  While looking in a mirror, press and release button in center of patch. A small green light will  flash 3-4 times. This will be your only indicator that the monitor has been turned on.  Do not shower for the first 24 hours. You may shower after the first 24 hours.  Press the button if you feel a symptom. You will hear a small click. Record Date, Time and  Symptom in the Patient Logbook.  When you are ready to remove the patch, follow instructions on the last 2 pages of Patient  Logbook. Stick patch monitor onto the last page of Patient Logbook.  Place Patient Logbook in the blue and white box. Use locking tab on box and tape box closed  securely. The blue and white box  has prepaid postage on it. Please  place it in the mailbox as  soon as possible. Your physician should have your test results approximately 7 days after the  monitor has been mailed back to [facility]  Call [facility] Customer Care at [phone] if you have questions regarding  your ZIO XT patch monitor. Call them immediately if you see an orange light blinking on your  monitor.  If your monitor falls off in less than 4 days, contact our Monitor department at [phone].  If your monitor becomes loose or falls off after 4 days call [employer] at [phone] for  suggestions on securing your monitor  \  ECHO  Your physician has requested that you have an echocardiogram. Echocardiography is a painless test that uses sound waves to create images of your heart. It provides your doctor with information about the size and shape of your heart and how well your heart's chambers and valves are working. This procedure takes approximately one hour. There are no restrictions for this procedure. Please do NOT wear cologne, perfume, aftershave, or lotions (deodorant is allowed). Please arrive 15 minutes prior to your appointment time.  Please note: We ask at that you not bring children with you during ultrasound (echo/ vascular) testing. Due to room size and safety concerns, children are not allowed in the ultrasound rooms during exams. Our front office staff cannot provide observation of children in our lobby area while testing is being conducted. An adult accompanying a patient to their appointment will only be allowed in the ultrasound room at the discretion of the ultrasound technician under special circumstances. We apologize for any inconvenience.    Follow-Up: At [facility], you and your health needs are our priority.  As part of our continuing mission to provide you with exceptional heart care, we have created designated Provider Care Teams.  These Care Teams include your  primary Cardiologist (physician) and Advanced Practice Providers (APPs -  Physician Assistants and Nurse Practitioners) who all work together to provide you with the care you need, when you need it.  We recommend signing up for the patient portal called MyChart.  Sign up information is provided on this After Visit Summary.  MyChart is used to connect with patients for Virtual Visits (Telemedicine).  Patients are able to view lab/test results, encounter notes, upcoming appointments, etc.  Non-urgent messages can be sent to your provider as well.   To learn more about what you can do with MyChart, go to [email or web address].    Your next appointment:   4 month(s)  Provider:   Dr. Kate  Other Instructions Please check blood pressure twice a day for next couple of weeks and send via mychart         Signed, Lonni LITTIE Kate, MD  12/23/2023 9:00 PM    [facility]

## 2023-12-22 ENCOUNTER — Encounter: Payer: Self-pay | Admitting: Cardiology

## 2023-12-22 ENCOUNTER — Ambulatory Visit: Payer: Medicare HMO | Attending: Cardiology | Admitting: Cardiology

## 2023-12-22 ENCOUNTER — Ambulatory Visit (INDEPENDENT_AMBULATORY_CARE_PROVIDER_SITE_OTHER): Payer: Medicare HMO

## 2023-12-22 VITALS — BP 144/74 | HR 64 | Ht 62.0 in | Wt 130.4 lb

## 2023-12-22 DIAGNOSIS — R002 Palpitations: Secondary | ICD-10-CM

## 2023-12-22 DIAGNOSIS — E785 Hyperlipidemia, unspecified: Secondary | ICD-10-CM

## 2023-12-22 DIAGNOSIS — I10 Essential (primary) hypertension: Secondary | ICD-10-CM | POA: Diagnosis not present

## 2023-12-22 NOTE — Progress Notes (Unsigned)
 Enrolled patient for a 7 day Zio XT monitor to be mailed to patients home.

## 2023-12-22 NOTE — Patient Instructions (Signed)
 Medication Instructions:  Continue current medications *If you need a refill on your cardiac medications before your next appointment, please call your pharmacy*   Lab Work: Bmet, mg, tsh today If you have labs (blood work) drawn today and your tests are completely normal, you will receive your results only by: MyChart Message (if you have MyChart) OR A paper copy in the mail If you have any lab test that is abnormal or we need to change your treatment, we will call you to review the results.   Testing/Procedures: Zio  ZIO XT- Long Term Monitor Instructions  Your physician has requested you wear a ZIO patch monitor for 7 days.  This is a single patch monitor. [employer] supplies one patch monitor per enrollment. Additional stickers are not available. Please do not apply patch if you will be having a Nuclear Stress Test,  Echocardiogram, Cardiac CT, MRI, or Chest Xray during the period you would be wearing the  monitor. The patch cannot be worn during these tests. You cannot remove and re-apply the  ZIO XT patch monitor.  Your ZIO patch monitor will be mailed 3 day USPS to your address on file. It may take 3-5 days  to receive your monitor after you have been enrolled.  Once you have received your monitor, please review the enclosed instructions. Your monitor  has already been registered assigning a specific monitor serial # to you.  Billing and Patient Assistance Program Information  We have supplied [employer] with any of your insurance information on file for billing purposes. [employer] offers a sliding scale Patient Assistance Program for patients that do not have  insurance, or whose insurance does not completely cover the cost of the ZIO monitor.  You must apply for the Patient Assistance Program to qualify for this discounted rate.  To apply, please call [employer] at [phone], select option 4, select option 2, ask to apply for  Patient Assistance Program. Meredeth will ask your  household income, and how many people  are in your household. They will quote your out-of-pocket cost based on that information.  [employer] will also be able to set up a 68-month, interest-free payment plan if needed.  Applying the monitor   Shave hair from upper left chest.  Hold abrader disc by orange tab. Rub abrader in 40 strokes over the upper left chest as  indicated in your monitor instructions.  Clean area with 4 enclosed alcohol pads. Let dry.  Apply patch as indicated in monitor instructions. Patch will be placed under collarbone on left  side of chest with arrow pointing upward.  Rub patch adhesive wings for 2 minutes. Remove white label marked 1. Remove the white  label marked 2. Rub patch adhesive wings for 2 additional minutes.  While looking in a mirror, press and release button in center of patch. A small green light will  flash 3-4 times. This will be your only indicator that the monitor has been turned on.  Do not shower for the first 24 hours. You may shower after the first 24 hours.  Press the button if you feel a symptom. You will hear a small click. Record Date, Time and  Symptom in the Patient Logbook.  When you are ready to remove the patch, follow instructions on the last 2 pages of Patient  Logbook. Stick patch monitor onto the last page of Patient Logbook.  Place Patient Logbook in the blue and white box. Use locking tab on box and tape box closed  securely.  The blue and white box has prepaid postage on it. Please place it in the mailbox as  soon as possible. Your physician should have your test results approximately 7 days after the  monitor has been mailed back to [facility].  Call [facility] Customer Care at [phone] if you have questions regarding  your ZIO XT patch monitor. Call them immediately if you see an orange light blinking on your  monitor.  If your monitor falls off in less than 4 days, contact our Monitor department at [phone].   If your monitor becomes loose or falls off after 4 days call [employer] at [phone] for  suggestions on securing your monitor  \  ECHO  Your physician has requested that you have an echocardiogram. Echocardiography is a painless test that uses sound waves to create images of your heart. It provides your doctor with information about the size and shape of your heart and how well your heart's chambers and valves are working. This procedure takes approximately one hour. There are no restrictions for this procedure. Please do NOT wear cologne, perfume, aftershave, or lotions (deodorant is allowed). Please arrive 15 minutes prior to your appointment time.  Please note: We ask at that you not bring children with you during ultrasound (echo/ vascular) testing. Due to room size and safety concerns, children are not allowed in the ultrasound rooms during exams. Our front office staff cannot provide observation of children in our lobby area while testing is being conducted. An adult accompanying a patient to their appointment will only be allowed in the ultrasound room at the discretion of the ultrasound technician under special circumstances. We apologize for any inconvenience.    Follow-Up: At [facility], you and your health needs are our priority.  As part of our continuing mission to provide you with exceptional heart care, we have created designated Provider Care Teams.  These Care Teams include your primary Cardiologist (physician) and Advanced Practice Providers (APPs -  Physician Assistants and Nurse Practitioners) who all work together to provide you with the care you need, when you need it.  We recommend signing up for the patient portal called MyChart.  Sign up information is provided on this After Visit Summary.  MyChart is used to connect with patients for Virtual Visits (Telemedicine).  Patients are able to view lab/test results, encounter notes, upcoming appointments, etc.   Non-urgent messages can be sent to your provider as well.   To learn more about what you can do with MyChart, go to [email or web address].    Your next appointment:   4 month(s)  Provider:   Dr. Kate  Other Instructions Please check blood pressure twice a day for next couple of weeks and send via mychart

## 2023-12-23 LAB — MAGNESIUM: Magnesium: 2.3 mg/dL (ref 1.6–2.3)

## 2023-12-23 LAB — BASIC METABOLIC PANEL
BUN/Creatinine Ratio: 14 (ref 12–28)
BUN: 10 mg/dL (ref 8–27)
CO2: 25 mmol/L (ref 20–29)
Calcium: 9.6 mg/dL (ref 8.7–10.3)
Chloride: 97 mmol/L (ref 96–106)
Creatinine, Ser: 0.74 mg/dL (ref 0.57–1.00)
Glucose: 83 mg/dL (ref 70–99)
Potassium: 4 mmol/L (ref 3.5–5.2)
Sodium: 143 mmol/L (ref 134–144)
eGFR: 86 mL/min/{1.73_m2} (ref >59)

## 2023-12-23 LAB — TSH: TSH: 1.71 u[IU]/mL (ref 0.450–4.500)

## 2023-12-26 DIAGNOSIS — F341 Dysthymic disorder: Secondary | ICD-10-CM | POA: Diagnosis not present

## 2023-12-26 DIAGNOSIS — F411 Generalized anxiety disorder: Secondary | ICD-10-CM | POA: Diagnosis not present

## 2023-12-28 DIAGNOSIS — R002 Palpitations: Secondary | ICD-10-CM

## 2024-01-02 DIAGNOSIS — F341 Dysthymic disorder: Secondary | ICD-10-CM | POA: Diagnosis not present

## 2024-01-02 DIAGNOSIS — F411 Generalized anxiety disorder: Secondary | ICD-10-CM | POA: Diagnosis not present

## 2024-01-09 DIAGNOSIS — F341 Dysthymic disorder: Secondary | ICD-10-CM | POA: Diagnosis not present

## 2024-01-09 DIAGNOSIS — F411 Generalized anxiety disorder: Secondary | ICD-10-CM | POA: Diagnosis not present

## 2024-01-10 DIAGNOSIS — I10 Essential (primary) hypertension: Secondary | ICD-10-CM | POA: Diagnosis not present

## 2024-01-10 DIAGNOSIS — J3489 Other specified disorders of nose and nasal sinuses: Secondary | ICD-10-CM | POA: Diagnosis not present

## 2024-01-11 ENCOUNTER — Ambulatory Visit (HOSPITAL_COMMUNITY): Payer: Medicare HMO

## 2024-01-12 DIAGNOSIS — R002 Palpitations: Secondary | ICD-10-CM | POA: Diagnosis not present

## 2024-01-16 DIAGNOSIS — F341 Dysthymic disorder: Secondary | ICD-10-CM | POA: Diagnosis not present

## 2024-01-16 DIAGNOSIS — F411 Generalized anxiety disorder: Secondary | ICD-10-CM | POA: Diagnosis not present

## 2024-01-23 DIAGNOSIS — F411 Generalized anxiety disorder: Secondary | ICD-10-CM | POA: Diagnosis not present

## 2024-01-23 DIAGNOSIS — F341 Dysthymic disorder: Secondary | ICD-10-CM | POA: Diagnosis not present

## 2024-01-24 DIAGNOSIS — F331 Major depressive disorder, recurrent, moderate: Secondary | ICD-10-CM | POA: Diagnosis not present

## 2024-01-24 DIAGNOSIS — F411 Generalized anxiety disorder: Secondary | ICD-10-CM | POA: Diagnosis not present

## 2024-01-24 DIAGNOSIS — F4310 Post-traumatic stress disorder, unspecified: Secondary | ICD-10-CM | POA: Diagnosis not present

## 2024-01-30 DIAGNOSIS — F341 Dysthymic disorder: Secondary | ICD-10-CM | POA: Diagnosis not present

## 2024-01-30 DIAGNOSIS — F411 Generalized anxiety disorder: Secondary | ICD-10-CM | POA: Diagnosis not present

## 2024-02-01 ENCOUNTER — Ambulatory Visit (HOSPITAL_COMMUNITY): Payer: Medicare HMO | Attending: Cardiovascular Disease

## 2024-02-01 DIAGNOSIS — I10 Essential (primary) hypertension: Secondary | ICD-10-CM | POA: Insufficient documentation

## 2024-02-01 DIAGNOSIS — R002 Palpitations: Secondary | ICD-10-CM | POA: Diagnosis not present

## 2024-02-01 DIAGNOSIS — E785 Hyperlipidemia, unspecified: Secondary | ICD-10-CM | POA: Diagnosis not present

## 2024-02-01 DIAGNOSIS — Z8249 Family history of ischemic heart disease and other diseases of the circulatory system: Secondary | ICD-10-CM | POA: Diagnosis not present

## 2024-02-01 DIAGNOSIS — I2510 Atherosclerotic heart disease of native coronary artery without angina pectoris: Secondary | ICD-10-CM | POA: Insufficient documentation

## 2024-02-01 LAB — ECHOCARDIOGRAM COMPLETE
Area-P 1/2: 6.77 cm2
S' Lateral: 2.9 cm

## 2024-02-02 ENCOUNTER — Encounter: Payer: Self-pay | Admitting: *Deleted

## 2024-02-02 ENCOUNTER — Telehealth: Payer: Self-pay | Admitting: Cardiology

## 2024-02-02 NOTE — Telephone Encounter (Signed)
 Patient called to follow-up on her test results.

## 2024-02-02 NOTE — Telephone Encounter (Signed)
 Pt calling back again she said she doesn't understand the MyChart messages and would like a c/b from the nurse.

## 2024-02-02 NOTE — Telephone Encounter (Signed)
 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  Heart pumping function is normal.   Patient identification verified by 2 forms. Destiny Rail, RN    Called and spoke to Patient  Patient states:   -would like more clarification on results   -would like results mailed to her home   -would like to know if there is anything to do for the stiffening or to stop it  RN informed patient:  -Relayed provider results   -overall heart pumping function is normal   -message sent to Dr. Bjorn Pippin regarding recommendations   -results placed in mail  Patient verbalized understanding, no questions at this time

## 2024-02-02 NOTE — Telephone Encounter (Signed)
 Pt notified of results from Dr Bjorn Pippin. She will await monitor result. Nothing further is needed at this time

## 2024-02-04 NOTE — Telephone Encounter (Signed)
 It is common to see some heart muscle stiffening as we get older.  Sometimes the heart muscle stiffening can cause fluid retention.  There is no evidence of fluid retention right now though.  Nothing we need to do for this at this time

## 2024-02-06 DIAGNOSIS — F341 Dysthymic disorder: Secondary | ICD-10-CM | POA: Diagnosis not present

## 2024-02-06 DIAGNOSIS — F411 Generalized anxiety disorder: Secondary | ICD-10-CM | POA: Diagnosis not present

## 2024-02-12 ENCOUNTER — Telehealth: Payer: Self-pay | Admitting: Cardiology

## 2024-02-12 ENCOUNTER — Telehealth: Payer: Self-pay | Admitting: *Deleted

## 2024-02-12 ENCOUNTER — Other Ambulatory Visit: Payer: Self-pay | Admitting: *Deleted

## 2024-02-12 ENCOUNTER — Encounter: Payer: Self-pay | Admitting: *Deleted

## 2024-02-12 MED ORDER — ATENOLOL 50 MG PO TABS: ORAL_TABLET | ORAL | 3 refills | Status: DC

## 2024-02-12 NOTE — Telephone Encounter (Signed)
 Follow Up:     Patient says she is returning a cwall from today, concerning her results.

## 2024-02-12 NOTE — Telephone Encounter (Signed)
 Called and spoke to patient and results given. Per Dr. Bjorn Pippin very frequent episodes of fast heart rhythm from top chamber of the heart, along with frequent extra beats from both top and bottom chambers. Made patient aware that Dr. Bjorn Pippin recommends increasing Atenolol 75mg  daily. Patient request to talk to provider at next schedule visit 6/24 about increasing Atenolol 75 mg daily. Patient verbalized she will call office if she decides to increase atenolol 75 mg , but request to remain on Atenolol 50 mg at this time due to blood pressure dropping at times. Educated patient to check blood pressure daily and send a log. Patient verbalized an understanding

## 2024-02-12 NOTE — Telephone Encounter (Signed)
 Spoke to patient she stated she has decided to increase Atenolol to 75 mg daily.I will send in a new prescription to your pharmacy.She requested monitor and echo results be mailed to her home.

## 2024-02-13 DIAGNOSIS — F341 Dysthymic disorder: Secondary | ICD-10-CM | POA: Diagnosis not present

## 2024-02-13 DIAGNOSIS — F411 Generalized anxiety disorder: Secondary | ICD-10-CM | POA: Diagnosis not present

## 2024-02-20 DIAGNOSIS — F411 Generalized anxiety disorder: Secondary | ICD-10-CM | POA: Diagnosis not present

## 2024-02-20 DIAGNOSIS — F341 Dysthymic disorder: Secondary | ICD-10-CM | POA: Diagnosis not present

## 2024-03-05 DIAGNOSIS — I10 Essential (primary) hypertension: Secondary | ICD-10-CM | POA: Diagnosis not present

## 2024-03-05 DIAGNOSIS — W57XXXA Bitten or stung by nonvenomous insect and other nonvenomous arthropods, initial encounter: Secondary | ICD-10-CM | POA: Diagnosis not present

## 2024-03-05 DIAGNOSIS — F411 Generalized anxiety disorder: Secondary | ICD-10-CM | POA: Diagnosis not present

## 2024-03-05 DIAGNOSIS — F341 Dysthymic disorder: Secondary | ICD-10-CM | POA: Diagnosis not present

## 2024-03-05 DIAGNOSIS — R002 Palpitations: Secondary | ICD-10-CM | POA: Diagnosis not present

## 2024-03-11 DIAGNOSIS — S80861A Insect bite (nonvenomous), right lower leg, initial encounter: Secondary | ICD-10-CM | POA: Diagnosis not present

## 2024-03-11 DIAGNOSIS — W57XXXA Bitten or stung by nonvenomous insect and other nonvenomous arthropods, initial encounter: Secondary | ICD-10-CM | POA: Diagnosis not present

## 2024-03-11 DIAGNOSIS — I10 Essential (primary) hypertension: Secondary | ICD-10-CM | POA: Diagnosis not present

## 2024-03-11 DIAGNOSIS — R002 Palpitations: Secondary | ICD-10-CM | POA: Diagnosis not present

## 2024-03-12 DIAGNOSIS — F341 Dysthymic disorder: Secondary | ICD-10-CM | POA: Diagnosis not present

## 2024-03-12 DIAGNOSIS — F411 Generalized anxiety disorder: Secondary | ICD-10-CM | POA: Diagnosis not present

## 2024-03-19 DIAGNOSIS — F411 Generalized anxiety disorder: Secondary | ICD-10-CM | POA: Diagnosis not present

## 2024-03-19 DIAGNOSIS — F341 Dysthymic disorder: Secondary | ICD-10-CM | POA: Diagnosis not present

## 2024-03-22 DIAGNOSIS — F411 Generalized anxiety disorder: Secondary | ICD-10-CM | POA: Diagnosis not present

## 2024-03-22 DIAGNOSIS — F4310 Post-traumatic stress disorder, unspecified: Secondary | ICD-10-CM | POA: Diagnosis not present

## 2024-03-22 DIAGNOSIS — F331 Major depressive disorder, recurrent, moderate: Secondary | ICD-10-CM | POA: Diagnosis not present

## 2024-03-26 DIAGNOSIS — F341 Dysthymic disorder: Secondary | ICD-10-CM | POA: Diagnosis not present

## 2024-03-26 DIAGNOSIS — F411 Generalized anxiety disorder: Secondary | ICD-10-CM | POA: Diagnosis not present

## 2024-04-02 DIAGNOSIS — F411 Generalized anxiety disorder: Secondary | ICD-10-CM | POA: Diagnosis not present

## 2024-04-02 DIAGNOSIS — F341 Dysthymic disorder: Secondary | ICD-10-CM | POA: Diagnosis not present

## 2024-04-09 DIAGNOSIS — F411 Generalized anxiety disorder: Secondary | ICD-10-CM | POA: Diagnosis not present

## 2024-04-09 DIAGNOSIS — F341 Dysthymic disorder: Secondary | ICD-10-CM | POA: Diagnosis not present

## 2024-04-16 DIAGNOSIS — F341 Dysthymic disorder: Secondary | ICD-10-CM | POA: Diagnosis not present

## 2024-04-16 DIAGNOSIS — F411 Generalized anxiety disorder: Secondary | ICD-10-CM | POA: Diagnosis not present

## 2024-04-30 DIAGNOSIS — F411 Generalized anxiety disorder: Secondary | ICD-10-CM | POA: Diagnosis not present

## 2024-04-30 DIAGNOSIS — F341 Dysthymic disorder: Secondary | ICD-10-CM | POA: Diagnosis not present

## 2024-05-06 NOTE — Progress Notes (Deleted)
 Cardiology Office Note:    Date:  05/06/2024   ID:  Destiny Elliott, DOB 1950/11/19, MRN [id number]  PCP:  Verena Mems, MD  Cardiologist:  None  Electrophysiologist:  None   Referring MD: Verena Mems, MD   No chief complaint on file.   History of Present Illness:    Destiny Elliott is a 73 y.o. female with a hx of hypertension, hyperlipidemia who presents for follow-up.  She was referred by Dr. Chrystal for evaluation of palpitations, initially seen 12/22/2023.  Echocardiogram 01/2024 showed EF 60 to 65%, grade 2 diastolic dysfunction, normal RV function, moderate elevated pulmonary pressures, severe left atrial enlargement, no significant valvular disease.  Zio patch x 9 days 01/2024 showed 1925 episodes of SVT, longest lasting 13 seconds, frequent PACs (11.7%) supraventricular couplets (7.3%) supraventricular couplets (3.7%), and frequent PVCs (7.2%).  Since last clinic visit,  She reports she has had URI symptoms recently and went to see PCP yesterday.  Reports was told that her heart rate was irregular.  She reports having palpitations about once per week.  She denies any exertional chest pain or dyspnea.  No lightheadedness or syncope.  Denies any lower extremity edema.  She smoked for 30 years, about 0.5 packs/day, quit in 2000.  Family history includes mother had cardiac disease but she is unclear about details.  Reports BP has been well-controlled when she checks at home.   Past Medical History:  Diagnosis Date   Abnormal Pap smear 1994   --CIN I   Allergic rhinitis    Anxiety    Dense breasts    Endocervical polyp 01/1993   -benign   Hypercholesterolemia    Hypertension    Osteopenia     Past Surgical History:  Procedure Laterality Date   CERVICAL BIOPSY  W/ LOOP ELECTRODE EXCISION  03/1993   CIN I   COLONOSCOPY  2016   endocervical polyp  01-27-93   -benign   TOOTH EXTRACTION  08/2017    Current Medications: No outpatient medications have been marked  as taking for the 05/07/24 encounter (Appointment) with Kate Lonni CROME, MD.     Allergies:   Ambien [zolpidem tartrate], Amlodipine besylate, Benadryl  [diphenhydramine  hcl], Buspar [buspirone], Flonase [fluticasone propionate], Gabapentin, Nickel, Nortriptyline, Other, Paxil [paroxetine hcl], Remeron [mirtazapine], Trazodone  hcl, Tylenol  pm extra [diphenhydramine -apap (sleep)], Vitamin d  analogs, and Zoloft [sertraline hcl]   Social History   Socioeconomic History   Marital status: Married    Spouse name: Todd   Number of children: 2   Years of education: 12   Highest education level: Not on file  Occupational History   Occupation: [employer]    Comment: self employed  Tobacco Use   Smoking status: Former    Current packs/day: 0.00    Average packs/day: 0.5 packs/day for 28.0 years (14.0 ttl pk-yrs)    Types: Cigarettes    Start date: 07/01/1976    Quit date: 07/01/2004    Years since quitting: 19.8   Smokeless tobacco: Never  Vaping Use   Vaping status: Never Used  Substance and Sexual Activity   Alcohol use: Yes    Comment: occ wine   Drug use: No   Sexual activity: Yes    Partners: Male    Birth control/protection: Post-menopausal    Comment: married  Other Topics Concern   Not on file  Social History Narrative   Lives with Krystal, one child deceased   Caffeine- 2 cups of tea   Social Drivers of Health  Financial Resource Strain: Not on file  Food Insecurity: Not on file  Transportation Needs: Not on file  Physical Activity: Not on file  Stress: Not on file  Social Connections: Not on file     Family History: The patient's family history includes Breast cancer (age of onset: 61) in her sister; COPD in her mother and sister; Heart disease in her mother; Heart disease (age of onset: 64) in her brother; Heart failure in her mother; Hyperlipidemia in her sister; Hypertension in her father and mother; Hypertension (age of onset: 24) in her sister;  Kidney disease in her father; Stroke (age of onset: 40) in her father; Suicidality (age of onset: 7) in her son; Thyroid  disease (age of onset: 41) in her sister; Thyroid  disease (age of onset: 68) in her mother.  ROS:   Please see the history of present illness.     All other systems reviewed and are negative.  EKGs/Labs/Other Studies Reviewed:    The following studies were reviewed today:   EKG:   12/22/2023: Sinus rhythm with PACs/PVCs.  Rate 64, QTc 453  Recent Labs: 12/22/2023: BUN 10; Creatinine, Ser 0.74; Magnesium  2.3; Potassium 4.0; Sodium 143; TSH 1.710  Recent Lipid Panel    Component Value Date/Time   CHOL 163 03/21/2018 0643   TRIG 77 03/21/2018 0643   HDL 49 03/21/2018 0643   CHOLHDL 3.3 03/21/2018 0643   VLDL 15 03/21/2018 0643   LDLCALC 99 03/21/2018 0643    Physical Exam:    VS:  LMP 10/14/2002 (Approximate)     Wt Readings from Last 3 Encounters:  12/22/23 130 lb 6.4 oz (59.1 kg)  03/29/23 134 lb (60.8 kg)  02/11/21 127 lb (57.6 kg)     GEN:  Well nourished, well developed in no acute distress HEENT: Normal NECK: No JVD; No carotid bruits LYMPHATICS: No lymphadenopathy CARDIAC: RRR, no murmurs, rubs, gallops RESPIRATORY:  Clear to auscultation without rales, wheezing or rhonchi  ABDOMEN: Soft, non-tender, non-distended MUSCULOSKELETAL:  No edema; No deformity  SKIN: Warm and dry NEUROLOGIC:  Alert and oriented x 3 PSYCHIATRIC:  Normal affect   ASSESSMENT:    No diagnosis found.  PLAN:    SVT/PACs/PVCs: Echocardiogram 01/2024 showed EF 60 to 65%, grade 2 diastolic dysfunction, normal RV function, moderate elevated pulmonary pressures, severe left atrial enlargement, no significant valvular disease.  Zio patch x 9 days 01/2024 showed 1925 episodes of SVT, longest lasting 13 seconds, frequent PACs (11.7%) supraventricular couplets (7.3%) supraventricular couplets (3.7%), and frequent PVCs (7.2%). Continue atenolol  25 mg daily-  Hypertension: On  atenolol  75 mg daily  Hyperlipidemia: LDL 106 on 08/28/2023, will monitor  RTC in 4 months***   Medication Adjustments/Labs and Tests Ordered: Current medicines are reviewed at length with the patient today.  Concerns regarding medicines are outlined above.  No orders of the defined types were placed in this encounter.  No orders of the defined types were placed in this encounter.   There are no Patient Instructions on file for this visit.   Signed, Lonni LITTIE Nanas, MD  05/06/2024 11:06 PM    [facility]

## 2024-05-07 ENCOUNTER — Ambulatory Visit: Payer: Medicare HMO | Admitting: Cardiology

## 2024-05-07 DIAGNOSIS — F341 Dysthymic disorder: Secondary | ICD-10-CM | POA: Diagnosis not present

## 2024-05-07 DIAGNOSIS — F411 Generalized anxiety disorder: Secondary | ICD-10-CM | POA: Diagnosis not present

## 2024-05-09 DIAGNOSIS — F411 Generalized anxiety disorder: Secondary | ICD-10-CM | POA: Diagnosis not present

## 2024-05-09 DIAGNOSIS — F4310 Post-traumatic stress disorder, unspecified: Secondary | ICD-10-CM | POA: Diagnosis not present

## 2024-05-09 DIAGNOSIS — F331 Major depressive disorder, recurrent, moderate: Secondary | ICD-10-CM | POA: Diagnosis not present

## 2024-05-14 DIAGNOSIS — F411 Generalized anxiety disorder: Secondary | ICD-10-CM | POA: Diagnosis not present

## 2024-05-14 DIAGNOSIS — F341 Dysthymic disorder: Secondary | ICD-10-CM | POA: Diagnosis not present

## 2024-05-20 NOTE — Progress Notes (Unsigned)
 Cardiology Office Note:    Date:  05/22/2024   ID:  Destiny Elliott, DOB [birth year], MRN [id number]  PCP:  Verena Mems, MD  Cardiologist:  None  Electrophysiologist:  None   Referring MD: Verena Mems, MD   Chief Complaint  Patient presents with   SVT    History of Present Illness:    Destiny Elliott is a 73 y.o. female with a hx of SVT, hypertension, hyperlipidemia who presents for follow-up.  She was referred by Dr. Chrystal for evaluation of palpitations, initially seen 12/22/2023.  Echocardiogram 01/2024 showed EF 60 to 65%, grade 2 diastolic dysfunction, normal RV function, moderate elevated pulmonary pressures, severe left atrial enlargement, no significant valvular disease.  Zio patch x 9 days 01/2024 showed 1925 episodes of SVT, longest lasting 13 seconds, frequent PACs (11.7%) supraventricular couplets (7.3%) supraventricular couplets (3.7%), and frequent PVCs (7.2%).  Since last clinic visit, she reports she is doing okay.  Reports BP has been 130s to 140s when she checks at home.  She decreased her atenolol  because heart rate in 50s.  Continues to have occasional palpitations.Denies any chest pain, dyspnea, lightheadedness, syncope, or lower extremity edema.   Past Medical History:  Diagnosis Date   Abnormal Pap smear 1994   --CIN I   Allergic rhinitis    Anxiety    Dense breasts    Endocervical polyp 01/1993   -benign   Hypercholesterolemia    Hypertension    Osteopenia     Past Surgical History:  Procedure Laterality Date   CERVICAL BIOPSY  W/ LOOP ELECTRODE EXCISION  03/1993   CIN I   COLONOSCOPY  2016   endocervical polyp  01-27-93   -benign   TOOTH EXTRACTION  08/2017    Current Medications: Current Meds  Medication Sig   atenolol  (TENORMIN ) 50 MG tablet Take 1.5 tablets (75 mg total) by mouth daily.   loratadine (CLARITIN) 10 MG tablet Take 10 mg by mouth daily.   sodium chloride  (OCEAN) 0.65 % SOLN nasal spray Place into both nostrils daily.    [DISCONTINUED] atenolol  (TENORMIN ) 50 MG tablet Take 1&1/2 tablets ( 75 mg ) daily   [DISCONTINUED] atenolol  (TENORMIN ) 50 MG tablet Take 1.5 tablets (75 mg total) by mouth 2 (two) times daily.     Allergies:   Ambien [zolpidem tartrate], Amlodipine besylate, Benadryl  [diphenhydramine  hcl], Buspar [buspirone], Flonase [fluticasone propionate], Gabapentin, Nickel, Nortriptyline, Other, Paxil [paroxetine hcl], Remeron [mirtazapine], Trazodone  hcl, Tylenol  pm extra [diphenhydramine -apap (sleep)], Vitamin d  analogs, and Zoloft [sertraline hcl]   Social History   Socioeconomic History   Marital status: Married    Spouse name: Todd   Number of children: 2   Years of education: 12   Highest education level: Not on file  Occupational History   Occupation: [employer]    Comment: self employed  Tobacco Use   Smoking status: Former    Current packs/day: 0.00    Average packs/day: 0.5 packs/day for 28.0 years (14.0 ttl pk-yrs)    Types: Cigarettes    Start date: 07/01/1976    Quit date: 07/01/2004    Years since quitting: 19.9   Smokeless tobacco: Never  Vaping Use   Vaping status: Never Used  Substance and Sexual Activity   Alcohol use: Yes    Comment: occ wine   Drug use: No   Sexual activity: Yes    Partners: Male    Birth control/protection: Post-menopausal    Comment: married  Other Topics Concern   Not on  file  Social History Narrative   Lives with Krystal, one child deceased   Caffeine- 2 cups of tea   Social Drivers of Corporate investment banker Strain: Not on file  Food Insecurity: Not on file  Transportation Needs: Not on file  Physical Activity: Not on file  Stress: Not on file  Social Connections: Not on file     Family History: The patient's family history includes Breast cancer (age of onset: 68) in her sister; COPD in her mother and sister; Heart disease in her mother; Heart disease (age of onset: 50) in her brother; Heart failure in her mother;  Hyperlipidemia in her sister; Hypertension in her father and mother; Hypertension (age of onset: 1) in her sister; Kidney disease in her father; Stroke (age of onset: 22) in her father; Suicidality (age of onset: 65) in her son; Thyroid  disease (age of onset: 64) in her sister; Thyroid  disease (age of onset: 83) in her mother.  ROS:   Please see the history of present illness.     All other systems reviewed and are negative.  EKGs/Labs/Other Studies Reviewed:    The following studies were reviewed today:   EKG:   12/22/2023: Sinus rhythm with PACs/PVCs.  Rate 64, QTc 453  Recent Labs: 12/22/2023: BUN 10; Creatinine, Ser 0.74; Magnesium  2.3; Potassium 4.0; Sodium 143; TSH 1.710  Recent Lipid Panel    Component Value Date/Time   CHOL 163 03/21/2018 0643   TRIG 77 03/21/2018 0643   HDL 49 03/21/2018 0643   CHOLHDL 3.3 03/21/2018 0643   VLDL 15 03/21/2018 0643   LDLCALC 99 03/21/2018 0643    Physical Exam:    VS:  BP (!) 168/72 (BP Location: Left Arm, Patient Position: Sitting, Cuff Size: Normal)   Pulse 64   Wt 131 lb (59.4 kg)   LMP 10/14/2002 (Approximate)   SpO2 98%   BMI 23.96 kg/m     Wt Readings from Last 3 Encounters:  05/22/24 131 lb (59.4 kg)  12/22/23 130 lb 6.4 oz (59.1 kg)  03/29/23 134 lb (60.8 kg)     GEN:  Well nourished, well developed in no acute distress HEENT: Normal NECK: No JVD; No carotid bruits LYMPHATICS: No lymphadenopathy CARDIAC: RRR, no murmurs, rubs, gallops RESPIRATORY:  Clear to auscultation without rales, wheezing or rhonchi  ABDOMEN: Soft, non-tender, non-distended MUSCULOSKELETAL:  No edema; No deformity  SKIN: Warm and dry NEUROLOGIC:  Alert and oriented x 3 PSYCHIATRIC:  Normal affect   ASSESSMENT:    1. SVT (supraventricular tachycardia) (HCC)   2. PVC's (premature ventricular contractions)   3. Pulmonary hypertension, unspecified (HCC)   4. Hyperlipidemia, unspecified hyperlipidemia type   5. Essential hypertension      PLAN:    SVT/PACs/PVCs: She reported palpitations.  Echocardiogram 01/2024 showed EF 60 to 65%, grade 2 diastolic dysfunction, normal RV function, moderate elevated pulmonary pressures, severe left atrial enlargement, no significant valvular disease.  Zio patch x 9 days 01/2024 showed 1925 episodes of SVT, longest lasting 13 seconds, frequent PACs (11.7%) supraventricular couplets (7.3%) supraventricular couplets (3.7%), and frequent PVCs (7.2%). - Will increase atenolol  to 75 mg daily.  Given very frequent SVT along with frequent PACs/PVCs despite beta-blocker and patient continuing to report palpitations, recommend EP referral  Pulmonary hypertension: Moderate pulmonary hypertension on echocardiogram.  Suspect likely group 2 due to diastolic heart failure, echo shows G2DD.  Recommend RHC for further evaluation, she declines at this time, states that she will think about it.  Euvolemic  on exam  Hypertension: On atenolol  50 mg daily but BP significantly elevated in clinic today, reports under better control at home.  Increase atenolol  to 75 mg daily as above.  Asked her to check BP twice daily for next week and let us  know results  Hyperlipidemia: LDL 106 on 08/28/2023.  Recommend calcium score for further risk stratification  RTC in 3 months   Medication Adjustments/Labs and Tests Ordered: Current medicines are reviewed at length with the patient today.  Concerns regarding medicines are outlined above.  Orders Placed This Encounter  Procedures   CT CARDIAC SCORING (SELF PAY ONLY)   Ambulatory referral to Cardiac Electrophysiology   Meds ordered this encounter  Medications   DISCONTD: atenolol  (TENORMIN ) 50 MG tablet    Sig: Take 1.5 tablets (75 mg total) by mouth 2 (two) times daily.    Dispense:  180 tablet    Refill:  3   atenolol  (TENORMIN ) 50 MG tablet    Sig: Take 1.5 tablets (75 mg total) by mouth daily.    Dispense:  180 tablet    Refill:  3    Patient Instructions   Medication Instructions:  Start Atenolol  75mg  daily Continue all current medications *If you need a refill on your cardiac medications before your next appointment, please call your pharmacy*  Lab Work: none If you have labs (blood work) drawn today and your tests are completely normal, you will receive your results only by: MyChart Message (if you have MyChart) OR A paper copy in the mail If you have any lab test that is abnormal or we need to change your treatment, we will call you to review the results.  Testing/Procedures: Dr. Kate  has ordered a CT coronary calcium score.   Test locations:  [facility]  [place] [facility] at [facility]  This is $99 out of pocket.   Coronary CalciumScan A coronary calcium scan is an imaging test used to look for deposits of calcium and other fatty materials (plaques) in the inner lining of the blood vessels of the heart (coronary arteries). These deposits of calcium and plaques can partly clog and narrow the coronary arteries without producing any symptoms or warning signs. This puts a person at risk for a heart attack. This test can detect these deposits before symptoms develop. Tell a health care provider about: Any allergies you have. All medicines you are taking, including vitamins, herbs, eye drops, creams, and over-the-counter medicines. Any problems you or family members have had with anesthetic medicines. Any blood disorders you have. Any surgeries you have had. Any medical conditions you have. Whether you are pregnant or may be pregnant. What are the risks? Generally, this is a safe procedure. However, problems may occur, including: Harm to a pregnant woman and her unborn baby. This test involves the use of radiation. Radiation exposure can be dangerous to a pregnant woman and her unborn baby. If you are pregnant, you generally should not have this procedure  done. Slight increase in the risk of cancer. This is because of the radiation involved in the test. What happens before the procedure? No preparation is needed for this procedure. What happens during the procedure? You will undress and remove any jewelry around your neck or chest. You will put on a hospital gown. Sticky electrodes will be placed on your chest. The electrodes will be connected to an electrocardiogram (ECG) machine to record a tracing of the electrical activity of  your heart. A CT scanner will take pictures of your heart. During this time, you will be asked to lie still and hold your breath for 2-3 seconds while a picture of your heart is being taken. The procedure may vary among health care providers and hospitals. What happens after the procedure? You can get dressed. You can return to your normal activities. It is up to you to get the results of your test. Ask your health care provider, or the department that is doing the test, when your results will be ready. Summary A coronary calcium scan is an imaging test used to look for deposits of calcium and other fatty materials (plaques) in the inner lining of the blood vessels of the heart (coronary arteries). Generally, this is a safe procedure. Tell your health care provider if you are pregnant or may be pregnant. No preparation is needed for this procedure. A CT scanner will take pictures of your heart. You can return to your normal activities after the scan is done. This information is not intended to replace advice given to you by your health care provider. Make sure you discuss any questions you have with your health care provider. Document Released: 04/28/2008 Document Revised: 09/19/2016 Document Reviewed: 09/19/2016 Elsevier Interactive Patient Education  2017 [employer].   Follow-Up: At [facility], you and your health needs are our priority.  As part of our continuing mission to provide you with  exceptional heart care, our providers are all part of one team.  This team includes your primary Cardiologist (physician) and Advanced Practice Providers or APPs (Physician Assistants and Nurse Practitioners) who all work together to provide you with the care you need, when you need it.  Your next appointment:   3 month(s)  Provider:   Dr.Gracemarie Skeet  We recommend signing up for the patient portal called MyChart.  Sign up information is provided on this After Visit Summary.  MyChart is used to connect with patients for Virtual Visits (Telemedicine).  Patients are able to view lab/test results, encounter notes, upcoming appointments, etc.  Non-urgent messages can be sent to your provider as well.   To learn more about what you can do with MyChart, go to [email or web address].   Other Instructions Please check blood pressure twice a day for week and send those reading  Referral to EP       Signed, Lonni LITTIE Nanas, MD  05/22/2024 9:33 AM    [facility]

## 2024-05-21 DIAGNOSIS — F341 Dysthymic disorder: Secondary | ICD-10-CM | POA: Diagnosis not present

## 2024-05-21 DIAGNOSIS — F411 Generalized anxiety disorder: Secondary | ICD-10-CM | POA: Diagnosis not present

## 2024-05-22 ENCOUNTER — Ambulatory Visit: Payer: Self-pay | Attending: Cardiology | Admitting: Cardiology

## 2024-05-22 VITALS — BP 168/72 | HR 64 | Wt 131.0 lb

## 2024-05-22 DIAGNOSIS — E785 Hyperlipidemia, unspecified: Secondary | ICD-10-CM

## 2024-05-22 DIAGNOSIS — I10 Essential (primary) hypertension: Secondary | ICD-10-CM | POA: Diagnosis not present

## 2024-05-22 DIAGNOSIS — I493 Ventricular premature depolarization: Secondary | ICD-10-CM

## 2024-05-22 DIAGNOSIS — I272 Pulmonary hypertension, unspecified: Secondary | ICD-10-CM | POA: Diagnosis not present

## 2024-05-22 DIAGNOSIS — I471 Supraventricular tachycardia, unspecified: Secondary | ICD-10-CM | POA: Diagnosis not present

## 2024-05-22 MED ORDER — ATENOLOL 50 MG PO TABS: 75.0000 mg | ORAL_TABLET | Freq: Every day | ORAL | 3 refills | Status: DC

## 2024-05-22 MED ORDER — ATENOLOL 50 MG PO TABS: 75.0000 mg | ORAL_TABLET | Freq: Two times a day (BID) | ORAL | 3 refills | Status: DC

## 2024-05-22 NOTE — Patient Instructions (Addendum)
 Medication Instructions:  Start Atenolol  75mg  daily Continue all current medications *If you need a refill on your cardiac medications before your next appointment, please call your pharmacy*  Lab Work: none If you have labs (blood work) drawn today and your tests are completely normal, you will receive your results only by: MyChart Message (if you have MyChart) OR A paper copy in the mail If you have any lab test that is abnormal or we need to change your treatment, we will call you to review the results.  Testing/Procedures: Dr. Kate  has ordered a CT coronary calcium score.   Test locations:  [facility]  [facility]  This is $99 out of pocket.   Coronary CalciumScan A coronary calcium scan is an imaging test used to look for deposits of calcium and other fatty materials (plaques) in the inner lining of the blood vessels of the heart (coronary arteries). These deposits of calcium and plaques can partly clog and narrow the coronary arteries without producing any symptoms or warning signs. This puts a person at risk for a heart attack. This test can detect these deposits before symptoms develop. Tell a health care provider about: Any allergies you have. All medicines you are taking, including vitamins, herbs, eye drops, creams, and over-the-counter medicines. Any problems you or family members have had with anesthetic medicines. Any blood disorders you have. Any surgeries you have had. Any medical conditions you have. Whether you are pregnant or may be pregnant. What are the risks? Generally, this is a safe procedure. However, problems may occur, including: Harm to a pregnant woman and her unborn baby. This test involves the use of radiation. Radiation exposure can be dangerous to a pregnant woman and her unborn baby. If you are pregnant, you generally should not have this procedure  done. Slight increase in the risk of cancer. This is because of the radiation involved in the test. What happens before the procedure? No preparation is needed for this procedure. What happens during the procedure? You will undress and remove any jewelry around your neck or chest. You will put on a hospital gown. Sticky electrodes will be placed on your chest. The electrodes will be connected to an electrocardiogram (ECG) machine to record a tracing of the electrical activity of your heart. A CT scanner will take pictures of your heart. During this time, you will be asked to lie still and hold your breath for 2-3 seconds while a picture of your heart is being taken. The procedure may vary among health care providers and hospitals. What happens after the procedure? You can get dressed. You can return to your normal activities. It is up to you to get the results of your test. Ask your health care provider, or the department that is doing the test, when your results will be ready. Summary A coronary calcium scan is an imaging test used to look for deposits of calcium and other fatty materials (plaques) in the inner lining of the blood vessels of the heart (coronary arteries). Generally, this is a safe procedure. Tell your health care provider if you are pregnant or may be pregnant. No preparation is needed for this procedure. A CT scanner will take pictures of your heart. You can return to your normal activities after the scan is done. This information is not intended to replace advice given to you by your health care provider. Make sure you discuss any questions  you have with your health care provider. Document Released: 04/28/2008 Document Revised: 09/19/2016 Document Reviewed: 09/19/2016 Elsevier Interactive Patient Education  2017 [employer].   Follow-Up: At [facility], you and your health needs are our priority.  As part of our continuing mission to provide you with  exceptional heart care, our providers are all part of one team.  This team includes your primary Cardiologist (physician) and Advanced Practice Providers or APPs (Physician Assistants and Nurse Practitioners) who all work together to provide you with the care you need, when you need it.  Your next appointment:   3 month(s)  Provider:   Dr.Schumann  We recommend signing up for the patient portal called MyChart.  Sign up information is provided on this After Visit Summary.  MyChart is used to connect with patients for Virtual Visits (Telemedicine).  Patients are able to view lab/test results, encounter notes, upcoming appointments, etc.  Non-urgent messages can be sent to your provider as well.   To learn more about what you can do with MyChart, go to [email or web address].   Other Instructions Please check blood pressure twice a day for week and send those reading  Referral to EP

## 2024-05-28 DIAGNOSIS — F341 Dysthymic disorder: Secondary | ICD-10-CM | POA: Diagnosis not present

## 2024-05-28 DIAGNOSIS — F411 Generalized anxiety disorder: Secondary | ICD-10-CM | POA: Diagnosis not present

## 2024-06-10 ENCOUNTER — Other Ambulatory Visit (HOSPITAL_BASED_OUTPATIENT_CLINIC_OR_DEPARTMENT_OTHER)

## 2024-06-11 DIAGNOSIS — F341 Dysthymic disorder: Secondary | ICD-10-CM | POA: Diagnosis not present

## 2024-06-11 DIAGNOSIS — F411 Generalized anxiety disorder: Secondary | ICD-10-CM | POA: Diagnosis not present

## 2024-06-20 DIAGNOSIS — F411 Generalized anxiety disorder: Secondary | ICD-10-CM | POA: Diagnosis not present

## 2024-06-20 DIAGNOSIS — F341 Dysthymic disorder: Secondary | ICD-10-CM | POA: Diagnosis not present

## 2024-06-21 ENCOUNTER — Other Ambulatory Visit: Payer: Self-pay | Admitting: Family Medicine

## 2024-06-21 DIAGNOSIS — Z1231 Encounter for screening mammogram for malignant neoplasm of breast: Secondary | ICD-10-CM

## 2024-06-25 DIAGNOSIS — F341 Dysthymic disorder: Secondary | ICD-10-CM | POA: Diagnosis not present

## 2024-06-25 DIAGNOSIS — F411 Generalized anxiety disorder: Secondary | ICD-10-CM | POA: Diagnosis not present

## 2024-06-27 DIAGNOSIS — Z1211 Encounter for screening for malignant neoplasm of colon: Secondary | ICD-10-CM | POA: Diagnosis not present

## 2024-07-02 ENCOUNTER — Ambulatory Visit
Admission: RE | Admit: 2024-07-02 | Discharge: 2024-07-02 | Disposition: A | Discharge status: Home / Self Care | Source: Ambulatory Visit | Attending: Family Medicine | Admitting: Family Medicine

## 2024-07-02 DIAGNOSIS — Z1231 Encounter for screening mammogram for malignant neoplasm of breast: Secondary | ICD-10-CM | POA: Diagnosis not present

## 2024-07-08 ENCOUNTER — Encounter (HOSPITAL_COMMUNITY): Payer: Self-pay | Admitting: Emergency Medicine

## 2024-07-08 ENCOUNTER — Emergency Department (HOSPITAL_COMMUNITY)
Admission: EM | Admit: 2024-07-08 | Discharge: 2024-07-08 | Disposition: A | Discharge status: Home / Self Care | Source: Ambulatory Visit | Attending: Emergency Medicine | Admitting: Emergency Medicine

## 2024-07-08 ENCOUNTER — Other Ambulatory Visit: Payer: Self-pay

## 2024-07-08 DIAGNOSIS — K625 Hemorrhage of anus and rectum: Secondary | ICD-10-CM | POA: Diagnosis not present

## 2024-07-08 DIAGNOSIS — I10 Essential (primary) hypertension: Secondary | ICD-10-CM | POA: Diagnosis not present

## 2024-07-08 DIAGNOSIS — R002 Palpitations: Secondary | ICD-10-CM | POA: Diagnosis not present

## 2024-07-08 DIAGNOSIS — I160 Hypertensive urgency: Secondary | ICD-10-CM | POA: Diagnosis not present

## 2024-07-08 DIAGNOSIS — Z79899 Other long term (current) drug therapy: Secondary | ICD-10-CM | POA: Insufficient documentation

## 2024-07-08 LAB — CBC
HCT: 43.6 % (ref 36.0–46.0)
Hemoglobin: 13.9 g/dL (ref 12.0–15.0)
MCH: 29 pg (ref 26.0–34.0)
MCHC: 31.9 g/dL (ref 30.0–36.0)
MCV: 90.8 fL (ref 80.0–100.0)
Platelets: 219 K/uL (ref 150–400)
RBC: 4.8 MIL/uL (ref 3.87–5.11)
RDW: 12.7 % (ref 11.5–15.5)
WBC: 5.2 K/uL (ref 4.0–10.5)
nRBC: 0 % (ref 0.0–0.2)

## 2024-07-08 LAB — URINALYSIS, ROUTINE W REFLEX MICROSCOPIC
Bacteria, UA: NONE SEEN
Bilirubin Urine: NEGATIVE
Glucose, UA: NEGATIVE mg/dL
Ketones, ur: NEGATIVE mg/dL
Leukocytes,Ua: NEGATIVE
Nitrite: NEGATIVE
Protein, ur: NEGATIVE mg/dL
Specific Gravity, Urine: 1.002 — ABNORMAL LOW (ref 1.005–1.030)
pH: 8 (ref 5.0–8.0)

## 2024-07-08 LAB — TROPONIN I (HIGH SENSITIVITY)
Troponin I (High Sensitivity): 19 ng/L — ABNORMAL HIGH (ref <18)
Troponin I (High Sensitivity): 20 ng/L — ABNORMAL HIGH (ref <18)

## 2024-07-08 LAB — BASIC METABOLIC PANEL WITH GFR
Anion gap: 11 (ref 5–15)
BUN: 8 mg/dL (ref 8–23)
CO2: 24 mmol/L (ref 22–32)
Calcium: 9.2 mg/dL (ref 8.9–10.3)
Chloride: 105 mmol/L (ref 98–111)
Creatinine, Ser: 0.85 mg/dL (ref 0.44–1.00)
GFR, Estimated: >60 mL/min (ref >60)
Glucose, Bld: 107 mg/dL — ABNORMAL HIGH (ref 70–99)
Potassium: 3.8 mmol/L (ref 3.5–5.1)
Sodium: 140 mmol/L (ref 135–145)

## 2024-07-08 MED ORDER — HYDRALAZINE HCL 20 MG/ML IJ SOLN
20.0000 mg | Freq: Once | INTRAMUSCULAR | Status: AC
  Administered 2024-07-08: 20 mg via INTRAVENOUS
  Filled 2024-07-08: qty 1

## 2024-07-08 MED ORDER — HYDROCHLOROTHIAZIDE 25 MG PO TABS: 25.0000 mg | ORAL_TABLET | Freq: Every day | ORAL | 0 refills | Status: DC

## 2024-07-08 NOTE — ED Provider Notes (Signed)
 [place] EMERGENCY DEPARTMENT AT [facility] Provider Note   CSN: [id number] Arrival date & time: 07/08/24  1046     Patient presents with: Hypertension   Destiny Elliott is a 73 y.o. female.   HPI 73 year old female history of hypertension presents today due to elevated blood pressure.  Patient reports that she has been followed by her primary care doctor and cardiologist for hypertension.  She was to increase her 50 mg of atenolol  to 75 mg several months ago.  However she just began at the increased dose 2 days ago.  She was seeing her primary care doctor today for an unrelated complaint and was noted to have blood pressures over 200.  She reports that she does check her blood pressure at home sometimes and the last time she thinks that she checked it was 2 days ago at which time it was 160 systolically.  She denies any headache, head injury, blood thinner use, chest pain, dyspnea, abdominal pain, nausea, vomiting, diarrhea, or changes in urinary pattern. Of note patient was seen in her protractor's office for an area of scabbing on her left breast and reports that she recently has had a mammogram has not had results back yet.    Prior to Admission medications   Medication Sig Start Date End Date Taking? Authorizing Provider  hydrochlorothiazide  (HYDRODIURIL ) 25 MG tablet Take 1 tablet (25 mg total) by mouth daily. 07/08/24  Yes Levander Houston, MD  albuterol  (VENTOLIN  HFA) 108 (90 Base) MCG/ACT inhaler Inhale 2 puffs into the lungs every 4 (four) hours as needed for wheezing or shortness of breath. Patient not taking: Reported on 05/22/2024    [provider]  atenolol  (TENORMIN ) 50 MG tablet Take 1.5 tablets (75 mg total) by mouth daily. 05/22/24 08/20/24  Kate Lonni CROME, MD  loratadine (CLARITIN) 10 MG tablet Take 10 mg by mouth daily.    [provider]  meloxicam (MOBIC) 15 MG tablet Take 15 mg by mouth daily. Patient not taking: Reported on 05/22/2024  03/17/23   [provider]  sodium chloride  (OCEAN) 0.65 % SOLN nasal spray Place into both nostrils daily.    [provider]    Allergies: Ambien [zolpidem tartrate], Amlodipine besylate, Benadryl  [diphenhydramine  hcl], Buspar [buspirone], Flonase [fluticasone propionate], Gabapentin, Nickel, Nortriptyline, Other, Paxil [paroxetine hcl], Remeron [mirtazapine], Trazodone  hcl, Tylenol  pm extra [diphenhydramine -apap (sleep)], Vitamin d  analogs, and Zoloft [sertraline hcl]    Review of Systems  Updated Vital Signs BP (!) 153/74   Pulse 65   Temp 97.8 F (36.6 C) (Oral)   Resp 18   LMP 10/14/2002 (Approximate)   SpO2 100%   Physical Exam Vitals reviewed.  Constitutional:      General: She is not in acute distress.    Appearance: Normal appearance.  HENT:     Head: Normocephalic.     Right Ear: External ear normal.     Left Ear: External ear normal.     Nose: Nose normal.     Mouth/Throat:     Pharynx: Oropharynx is clear.  Eyes:     Pupils: Pupils are equal, round, and reactive to light.  Cardiovascular:     Rate and Rhythm: Normal rate and regular rhythm.     Pulses: Normal pulses.  Pulmonary:     Effort: Pulmonary effort is normal.     Breath sounds: Normal breath sounds.  Abdominal:     General: Abdomen is flat. Bowel sounds are normal.     Palpations: Abdomen is  soft.  Musculoskeletal:        General: Normal range of motion.     Cervical back: Normal range of motion.  Skin:    General: Skin is warm and dry.     Capillary Refill: Capillary refill takes less than 2 seconds.  Neurological:     General: No focal deficit present.     Mental Status: She is alert.  Psychiatric:        Mood and Affect: Mood normal.     (all labs ordered are listed, but only abnormal results are displayed) Labs Reviewed  BASIC METABOLIC PANEL WITH GFR - Abnormal; Notable for the following components:      Result Value   Glucose, Bld 107 (*)    All other components  within normal limits  URINALYSIS, ROUTINE W REFLEX MICROSCOPIC - Abnormal; Notable for the following components:   Color, Urine COLORLESS (*)    Specific Gravity, Urine 1.002 (*)    Hgb urine dipstick SMALL (*)    All other components within normal limits  TROPONIN I (HIGH SENSITIVITY) - Abnormal; Notable for the following components:   Troponin I (High Sensitivity) 19 (*)    All other components within normal limits  TROPONIN I (HIGH SENSITIVITY) - Abnormal; Notable for the following components:   Troponin I (High Sensitivity) 20 (*)    All other components within normal limits  CBC    EKG: EKG Interpretation Date/Time:  Monday July 08 2024 11:42:46 EDT Ventricular Rate:  62 PR Interval:  128 QRS Duration:  88 QT Interval:  478 QTC Calculation: 485 R Axis:   64  Text Interpretation: Sinus rhythm with marked sinus arrhythmia with occasional Premature ventricular complexes Otherwise normal ECG When compared with ECG of 22-Dec-2023 11:30, No significant change since first prior ekg Confirmed by Levander Houston [phone]) on 07/08/2024 11:46:09 AM  Radiology: No results found.   Procedures   Medications Ordered in the ED  hydrALAZINE  (APRESOLINE ) injection 20 mg (20 mg Intravenous Given 07/08/24 1218)    Clinical Course as of 07/08/24 1511  Mon Jul 08, 2024  1254 CBC reviewed interpreted and within normal limits [DR]  1254 Urinalysis reviewed interpreted sent for 05 red blood cells, 0-5 white blood cells, 0-5 squamous epithelial and no bacteria seen no protein noted [DR]  1311 Troponin reviewed interpreted 19.  Patient does not have chest pain. [DR]  1350 Basic metabolic panel reviewed interpreted and within normal limits [DR]    Clinical Course User Index [DR] Levander Houston, MD                                 Medical Decision Making Amount and/or Complexity of Data Reviewed Labs: ordered.  Risk Prescription drug management.   73 year old female presents today  complaining of hypertension.  Patient denies any headache, chest pain, dyspnea. Patient evaluated here in the ED for end organ damage.  Workup reveals normal renal function, EKG without acutely ischemic changes.  No evidence of hypertensive emergency.  Patient treated here in the ED with hydralazine  and blood pressure has decreased to 175/89.  Patient had atenolol  increased from 50 to 75 6 weeks ago but did not change meds until 2 days ago. Reviewed multiple medications.  Patient has had reactions to multiple medication, medication show shared ingredient of ingredient of cornstarch prior medicine hydrochlorothiazide .  I reviewed this with the patient and she has not had any problems with cornstarch  that she knows of in the past.  Will prescribe hydrochlorothiazide .  She is advised regarding need for close follow-up, blood pressure monitoring, symptom including worsening of blood pressure, stroke symptoms, chest pain     Final diagnoses:  Hypertensive urgency    ED Discharge Orders          Ordered    hydrochlorothiazide  (HYDRODIURIL ) 25 MG tablet  Daily        07/08/24 1509               Levander Houston, MD 07/08/24 1511

## 2024-07-08 NOTE — Discharge Instructions (Addendum)
 You were evaluated in the emergency department today for high blood pressure.  Your EKG is unchanged from before, your heart enzymes are stable, your kidney function is normal, there is no evidence of stroke.  You are being started on a second blood pressure medication.  Please continue taking your current blood pressure medications at the advised dose of 75 mg a day for your atenolol .  Hydralazine  is being added.  Please take this also.  Please have your blood pressure rechecked this week with your doctor.  Return if you are having any new or worsening symptoms

## 2024-07-08 NOTE — ED Triage Notes (Addendum)
 Pt came in for HTN at home and at her doctors office.  Pt states she has been having a little trouble hearing and processing information with the HTN. Pt takes Atenolol  and had increased dose from 50 mg to 75 mg 2 days ago.

## 2024-07-08 NOTE — ED Notes (Signed)
 Patient transported to X-ray

## 2024-07-08 NOTE — ED Notes (Signed)
Pt is in restroom 

## 2024-07-09 DIAGNOSIS — F411 Generalized anxiety disorder: Secondary | ICD-10-CM | POA: Diagnosis not present

## 2024-07-09 DIAGNOSIS — F341 Dysthymic disorder: Secondary | ICD-10-CM | POA: Diagnosis not present

## 2024-07-16 ENCOUNTER — Emergency Department (HOSPITAL_BASED_OUTPATIENT_CLINIC_OR_DEPARTMENT_OTHER)
Admission: EM | Admit: 2024-07-16 | Discharge: 2024-07-16 | Disposition: A | Discharge status: Home / Self Care | Attending: Emergency Medicine | Admitting: Emergency Medicine

## 2024-07-16 ENCOUNTER — Other Ambulatory Visit: Payer: Self-pay

## 2024-07-16 ENCOUNTER — Encounter (HOSPITAL_BASED_OUTPATIENT_CLINIC_OR_DEPARTMENT_OTHER): Payer: Self-pay

## 2024-07-16 DIAGNOSIS — E876 Hypokalemia: Secondary | ICD-10-CM | POA: Insufficient documentation

## 2024-07-16 DIAGNOSIS — I159 Secondary hypertension, unspecified: Secondary | ICD-10-CM | POA: Diagnosis not present

## 2024-07-16 DIAGNOSIS — I10 Essential (primary) hypertension: Secondary | ICD-10-CM | POA: Diagnosis not present

## 2024-07-16 LAB — CBC WITH DIFFERENTIAL/PLATELET
Abs Immature Granulocytes: 0.01 K/uL (ref 0.00–0.07)
Basophils Absolute: 0 K/uL (ref 0.0–0.1)
Basophils Relative: 1 %
Eosinophils Absolute: 0.1 K/uL (ref 0.0–0.5)
Eosinophils Relative: 1 %
HCT: 40.7 % (ref 36.0–46.0)
Hemoglobin: 13.6 g/dL (ref 12.0–15.0)
Immature Granulocytes: 0 %
Lymphocytes Relative: 27 %
Lymphs Abs: 1.2 K/uL (ref 0.7–4.0)
MCH: 29 pg (ref 26.0–34.0)
MCHC: 33.4 g/dL (ref 30.0–36.0)
MCV: 86.8 fL (ref 80.0–100.0)
Monocytes Absolute: 0.6 K/uL (ref 0.1–1.0)
Monocytes Relative: 14 %
Neutro Abs: 2.5 K/uL (ref 1.7–7.7)
Neutrophils Relative %: 57 %
Platelets: 194 K/uL (ref 150–400)
RBC: 4.69 MIL/uL (ref 3.87–5.11)
RDW: 12.9 % (ref 11.5–15.5)
WBC: 4.4 K/uL (ref 4.0–10.5)
nRBC: 0 % (ref 0.0–0.2)

## 2024-07-16 LAB — BASIC METABOLIC PANEL WITH GFR
Anion gap: 15 (ref 5–15)
BUN: 17 mg/dL (ref 8–23)
CO2: 27 mmol/L (ref 22–32)
Calcium: 9.7 mg/dL (ref 8.9–10.3)
Chloride: 96 mmol/L — ABNORMAL LOW (ref 98–111)
Creatinine, Ser: 0.96 mg/dL (ref 0.44–1.00)
GFR, Estimated: >60 mL/min (ref >60)
Glucose, Bld: 106 mg/dL — ABNORMAL HIGH (ref 70–99)
Potassium: 3.2 mmol/L — ABNORMAL LOW (ref 3.5–5.1)
Sodium: 138 mmol/L (ref 135–145)

## 2024-07-16 LAB — TROPONIN T, HIGH SENSITIVITY
Troponin T High Sensitivity: 24 ng/L — ABNORMAL HIGH (ref 0–19)
Troponin T High Sensitivity: 25 ng/L — ABNORMAL HIGH (ref 0–19)

## 2024-07-16 MED ORDER — POTASSIUM CHLORIDE CRYS ER 20 MEQ PO TBCR: 20.0000 meq | EXTENDED_RELEASE_TABLET | Freq: Once | ORAL | 0 refills | Status: DC

## 2024-07-16 MED ORDER — POTASSIUM CHLORIDE CRYS ER 20 MEQ PO TBCR
40.0000 meq | EXTENDED_RELEASE_TABLET | Freq: Once | ORAL | Status: AC
  Administered 2024-07-16: 40 meq via ORAL
  Filled 2024-07-16: qty 2

## 2024-07-16 NOTE — ED Provider Notes (Signed)
 [place] EMERGENCY DEPARTMENT AT [facility] Provider Note   CSN: [id number] Arrival date & time: 07/16/24  0000     History Chief Complaint  Patient presents with   Hypertension    Patient as visitor in the department with spouse, verbalized concern for elevated blood pressure; Charge RN in to take readings; SBP >200; patient denies symptoms at this time (free of dizziness, nausea, headache); A/O x 4, skin warm dry, spontaneous, even non-labored respirations noted    HPI Destiny Elliott is a 73 y.o. female presenting for chief complaint of elevated BP. 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  Denies any other symptoms.  Patient's recorded medical, surgical, social, medication list and allergies were reviewed in the Snapshot window as part of the initial history.   Review of Systems   Review of Systems  Constitutional:  Negative for chills and fever.  HENT:  Negative for ear pain and sore throat.   Eyes:  Negative for pain and visual disturbance.  Respiratory:  Negative for cough and shortness of breath.   Cardiovascular:  Negative for chest pain and palpitations.  Gastrointestinal:  Negative for abdominal pain and vomiting.  Genitourinary:  Negative for dysuria and hematuria.  Musculoskeletal:  Negative for arthralgias and back pain.  Skin:  Negative for color change and rash.  Neurological:  Negative for seizures and syncope.  All other systems reviewed and are negative.   Physical Exam Updated Vital Signs BP (!) 160/88   Pulse 69   Temp 98.2 F (36.8 C) (Oral)   Resp 18   Ht 5' 2 (1.575 m)   Wt 59 kg   LMP 10/14/2002 (Approximate)   SpO2 100%   BMI 23.78 kg/m  Physical Exam Vitals and nursing note reviewed.  Constitutional:      General: She is not in acute distress.    Appearance: She is well-developed.  HENT:     Head: Normocephalic and atraumatic.  Eyes:     Conjunctiva/sclera:  Conjunctivae normal.  Cardiovascular:     Rate and Rhythm: Normal rate and regular rhythm.     Heart sounds: No murmur heard. Pulmonary:     Effort: Pulmonary effort is normal. No respiratory distress.     Breath sounds: Normal breath sounds.  Abdominal:     General: There is no distension.     Palpations: Abdomen is soft.     Tenderness: There is no abdominal tenderness. There is no right CVA tenderness or left CVA tenderness.  Musculoskeletal:        General: No swelling or tenderness. Normal range of motion.     Cervical back: Neck supple.  Skin:    General: Skin is warm and dry.  Neurological:     General: No focal deficit present.     Mental Status: She is alert and oriented to person, place, and time. Mental status is at baseline.     Cranial Nerves: No cranial nerve deficit.      ED Course/ Medical Decision Making/ A&P    Procedures Procedures   Medications Ordered in ED Medications  potassium chloride  SA (KLOR-CON  M) CR tablet 40 mEq (40 mEq Oral Given 07/16/24 0137)   Medical Decision Making:   Destiny Elliott is a 73 y.o. female who presented to the ED today with asymptomatic high blood pressure detailed above.    Patient placed on continuous vitals and telemetry monitoring while in ED which  was reviewed periodically.  Complete initial physical exam performed, notably the patient  was hemodynamically stable no acute distress.    Reviewed and confirmed nursing documentation for past medical history, family history, social history.    Initial Assessment:   With the patient's presentation of elevated blood pressure readings, most likely diagnosis is hypertensive urgency. Other diagnoses associated with hypertensive emergency were considered including (but not limited to) intracranial hemorrhage, acute renal artery stenosis, acute kidney injury, myocardial stress, ophthalmologic emergencies. These are considered less likely due to history of present illness and physical  exam findings.   This is most consistent with an acute life/limb threatening illness complicated by underlying chronic conditions. Will evaluate for hypertensive emergency as below.  Initial Plan:  Screening labs including CBC and Metabolic panel to evaluate for infectious or metabolic etiology of disease.  Urinalysis with reflex culture ordered to evaluate for UTI or relevant urologic/nephrologic pathology.  EKG and single troponin to evaluate for cardiac pathology. Single troponin appropriate due to greater than 6 hours since maximal intensity of symptoms. Objective evaluation as below reviewed. Considered further administration of antihypertensives in ED, per consensus guidelines for [facility] of emergency physicians, acute treatment of hypertensive urgency alone in the emergency department is not recommended.  If patient has evidence of hypertensive emergency on objective laboratory evaluation, will reevaluate.  Will monitor blood pressure while patient awaiting above laboratory studies.  Initial Study Results:   Laboratory  All laboratory results reviewed without evidence of clinically relevant pathology.   Exceptions include: Troponin 24  EKG EKG was reviewed independently. Rate, rhythm, axis, intervals all examined and without medically relevant abnormality. ST segments without concerns for elevations.    Radiology:  All images reviewed independently. Agree with radiology report at this time.   MM 3D SCREENING MAMMOGRAM BILATERAL BREAST Result Date: 07/05/2024 CLINICAL DATA:  Screening. EXAM: DIGITAL SCREENING BILATERAL MAMMOGRAM WITH TOMOSYNTHESIS AND CAD TECHNIQUE: Bilateral screening digital craniocaudal and mediolateral oblique mammograms were obtained. Bilateral screening digital breast tomosynthesis was performed. The images were evaluated with computer-aided detection. COMPARISON:  Previous exam(s). ACR Breast Density Category c: The breasts are heterogeneously dense, which  may obscure small masses. FINDINGS: There are no findings suspicious for malignancy. IMPRESSION: No mammographic evidence of malignancy. A result letter of this screening mammogram will be mailed directly to the patient. RECOMMENDATION: Screening mammogram in one year. (Code:SM-B-01Y) BI-RADS CATEGORY  1: Negative. Electronically Signed   By: Alm Parkins M.D.   On: 07/05/2024 07:40     Reassessment and Plan:   Patient remained chest pain-free.  Troponin within margin of error of lab testing between prior samples given different cardiac enzyme. No evidence of acute ischemia. Blood pressure downtrending spontaneously.  Patient remained asymptomatic.  Hypokalemia discussed.  Will start on supplement. Patient educated on ongoing plan of care already has follow-up arranged with PCP this week. Disposition:  I have considered need for hospitalization, however, considering all of the above, I believe this patient is stable for discharge at this time.  Patient/family educated about specific return precautions for given chief complaint and symptoms.  Patient/family educated about follow-up with PCP.     Patient/family expressed understanding of return precautions and need for follow-up. Patient spoken to regarding all imaging and laboratory results and appropriate follow up for these results. All education provided in verbal form with additional information in written form. Time was allowed for answering of patient questions. Patient discharged.    Emergency Department Medication Summary:   Medications  potassium  chloride SA (KLOR-CON  M) CR tablet 40 mEq (40 mEq Oral Given 07/16/24 0137)          Clinical Impression:  1. Secondary hypertension   2. Hypokalemia      Discharge   Final Clinical Impression(s) / ED Diagnoses Final diagnoses:  Secondary hypertension  Hypokalemia    Rx / DC Orders ED Discharge Orders          Ordered    potassium chloride  SA (KLOR-CON  M) 20 MEQ tablet   Once         07/16/24 0130              Jerral Meth, MD 07/16/24 [phone]

## 2024-07-16 NOTE — ED Triage Notes (Signed)
 Patient here seeing her husband when she asked us  to check her BP and it was found to be elevated. She has now checked in for HTN. History of this in the past.

## 2024-07-19 DIAGNOSIS — F411 Generalized anxiety disorder: Secondary | ICD-10-CM | POA: Diagnosis not present

## 2024-07-19 DIAGNOSIS — F331 Major depressive disorder, recurrent, moderate: Secondary | ICD-10-CM | POA: Diagnosis not present

## 2024-07-19 DIAGNOSIS — F4310 Post-traumatic stress disorder, unspecified: Secondary | ICD-10-CM | POA: Diagnosis not present

## 2024-07-22 DIAGNOSIS — I10 Essential (primary) hypertension: Secondary | ICD-10-CM | POA: Diagnosis not present

## 2024-07-23 DIAGNOSIS — F341 Dysthymic disorder: Secondary | ICD-10-CM | POA: Diagnosis not present

## 2024-07-23 DIAGNOSIS — F411 Generalized anxiety disorder: Secondary | ICD-10-CM | POA: Diagnosis not present

## 2024-07-30 DIAGNOSIS — F341 Dysthymic disorder: Secondary | ICD-10-CM | POA: Diagnosis not present

## 2024-07-30 DIAGNOSIS — F411 Generalized anxiety disorder: Secondary | ICD-10-CM | POA: Diagnosis not present

## 2024-08-06 DIAGNOSIS — F341 Dysthymic disorder: Secondary | ICD-10-CM | POA: Diagnosis not present

## 2024-08-06 DIAGNOSIS — F411 Generalized anxiety disorder: Secondary | ICD-10-CM | POA: Diagnosis not present

## 2024-08-11 DIAGNOSIS — U071 COVID-19: Secondary | ICD-10-CM | POA: Diagnosis not present

## 2024-08-11 DIAGNOSIS — R051 Acute cough: Secondary | ICD-10-CM | POA: Diagnosis not present

## 2024-08-11 DIAGNOSIS — Z03818 Encounter for observation for suspected exposure to other biological agents ruled out: Secondary | ICD-10-CM | POA: Diagnosis not present

## 2024-08-13 DIAGNOSIS — F411 Generalized anxiety disorder: Secondary | ICD-10-CM | POA: Diagnosis not present

## 2024-08-18 NOTE — Progress Notes (Unsigned)
 Cardiology Office Note:    Date:  08/22/2024   ID:  Destiny Elliott, DOB 28-Mar-1951, MRN [id number]  PCP:  Verena Mems, MD  Cardiologist:  None  Electrophysiologist:  None   Referring MD: Verena Mems, MD   Chief Complaint  Patient presents with   Hypertension    History of Present Illness:    Destiny Elliott is a 73 y.o. female with a hx of SVT, hypertension, hyperlipidemia who presents for follow-up.  She was referred by Dr. Chrystal for evaluation of palpitations, initially seen 12/22/2023.  Echocardiogram 01/2024 showed EF 60 to 65%, grade 2 diastolic dysfunction, normal RV function, moderate elevated pulmonary pressures, severe left atrial enlargement, no significant valvular disease.  Zio patch x 9 days 01/2024 showed 1925 episodes of SVT, longest lasting 13 seconds, frequent PACs (11.7%) supraventricular couplets (7.3%) supraventricular couplets (3.7%), and frequent PVCs (7.2%).  Since last clinic visit, she was seen in the ED with hypertension, denied any symptoms.  She was started on HCTZ 25 mg daily but reports stopped taking.  She is now on olmesartan 5 mg daily, in addition to continue to take her atenolol .  Reports BP is in 130s to 160s when she checks at home.  She reports she did take her BP meds today.  She denies any symptoms.  Denies any chest pain, dyspnea, headaches, visual changes.  She reports her palpitations have improved.  Past Medical History:  Diagnosis Date   Abnormal Pap smear 1994   --CIN I   Allergic rhinitis    Anxiety    Dense breasts    Endocervical polyp 01/1993   -benign   Hypercholesterolemia    Hypertension    Osteopenia     Past Surgical History:  Procedure Laterality Date   CERVICAL BIOPSY  W/ LOOP ELECTRODE EXCISION  03/1993   CIN I   COLONOSCOPY  2016   endocervical polyp  01-27-93   -benign   TOOTH EXTRACTION  08/2017    Current Medications: Current Meds  Medication Sig   atenolol  (TENORMIN ) 50 MG tablet Take 1.5  tablets (75 mg total) by mouth daily.   loratadine (CLARITIN) 10 MG tablet Take 10 mg by mouth daily.   olmesartan (BENICAR) 20 MG tablet Take 0.5 tablets (10 mg total) by mouth daily.   sodium chloride  (OCEAN) 0.65 % SOLN nasal spray Place into both nostrils daily.   [DISCONTINUED] albuterol  (VENTOLIN  HFA) 108 (90 Base) MCG/ACT inhaler Inhale 2 puffs into the lungs every 4 (four) hours as needed for wheezing or shortness of breath.   [DISCONTINUED] hydrochlorothiazide  (HYDRODIURIL ) 25 MG tablet Take 1 tablet (25 mg total) by mouth daily.   [DISCONTINUED] potassium chloride  SA (KLOR-CON  M) 20 MEQ tablet Take 1 tablet (20 mEq total) by mouth once for 1 dose.     Allergies:   Ambien [zolpidem tartrate], Amlodipine besylate, Benadryl  [diphenhydramine  hcl], Buspar [buspirone], Flonase [fluticasone propionate], Gabapentin, Nickel, Nortriptyline, Other, Paxil [paroxetine hcl], Remeron [mirtazapine], Trazodone  hcl, Tylenol  pm extra [diphenhydramine -apap (sleep)], Vitamin d  analogs, and Zoloft [sertraline hcl]   Social History   Socioeconomic History   Marital status: Married    Spouse name: Todd   Number of children: 2   Years of education: 12   Highest education level: Not on file  Occupational History   Occupation: [employer]    Comment: self employed  Tobacco Use   Smoking status: Former    Current packs/day: 0.00    Average packs/day: 0.5 packs/day for 28.0 years (14.0 ttl pk-yrs)  Types: Cigarettes    Start date: 07/01/1976    Quit date: 07/01/2004    Years since quitting: 20.1   Smokeless tobacco: Never  Vaping Use   Vaping status: Never Used  Substance and Sexual Activity   Alcohol use: Yes    Comment: occ wine   Drug use: No   Sexual activity: Yes    Partners: Male    Birth control/protection: Post-menopausal    Comment: married  Other Topics Concern   Not on file  Social History Narrative   Lives with Krystal, one child deceased   Caffeine- 2 cups of tea    Social Drivers of Corporate investment banker Strain: Not on file  Food Insecurity: Not on file  Transportation Needs: Not on file  Physical Activity: Not on file  Stress: Not on file  Social Connections: Not on file     Family History: The patient's family history includes Breast cancer (age of onset: 79) in her sister; COPD in her mother and sister; Heart disease in her mother; Heart disease (age of onset: 3) in her brother; Heart failure in her mother; Hyperlipidemia in her sister; Hypertension in her father and mother; Hypertension (age of onset: 24) in her sister; Kidney disease in her father; Stroke (age of onset: 41) in her father; Suicidality (age of onset: 26) in her son; Thyroid  disease (age of onset: 54) in her sister; Thyroid  disease (age of onset: 11) in her mother.  ROS:   Please see the history of present illness.     All other systems reviewed and are negative.  EKGs/Labs/Other Studies Reviewed:    The following studies were reviewed today:   EKG:   12/22/2023: Sinus rhythm with PACs/PVCs.  Rate 64, QTc 453  Recent Labs: 12/22/2023: Magnesium  2.3; TSH 1.710 07/16/2024: BUN 17; Creatinine, Ser 0.96; Hemoglobin 13.6; Platelets 194; Potassium 3.2; Sodium 138  Recent Lipid Panel    Component Value Date/Time   CHOL 163 03/21/2018 0643   TRIG 77 03/21/2018 0643   HDL 49 03/21/2018 0643   CHOLHDL 3.3 03/21/2018 0643   VLDL 15 03/21/2018 0643   LDLCALC 99 03/21/2018 0643    Physical Exam:    VS:  BP (!) 226/78   Pulse 97   Ht 5' 2 (1.575 m)   Wt 126 lb (57.2 kg)   LMP 10/14/2002 (Approximate)   SpO2 96%   BMI 23.05 kg/m     Wt Readings from Last 3 Encounters:  08/22/24 126 lb (57.2 kg)  07/16/24 130 lb (59 kg)  05/22/24 131 lb (59.4 kg)     GEN:  Well nourished, well developed in no acute distress HEENT: Normal NECK: No JVD; No carotid bruits LYMPHATICS: No lymphadenopathy CARDIAC: RRR, no murmurs, rubs, gallops RESPIRATORY:  Clear to auscultation  without rales, wheezing or rhonchi  ABDOMEN: Soft, non-tender, non-distended MUSCULOSKELETAL:  No edema; No deformity  SKIN: Warm and dry NEUROLOGIC:  Alert and oriented x 3 PSYCHIATRIC:  Normal affect   ASSESSMENT:    1. SVT (supraventricular tachycardia)   2. Palpitations   3. Pulmonary hypertension, unspecified (HCC)   4. Hyperlipidemia, unspecified hyperlipidemia type   5. Resistant hypertension     PLAN:    SVT/PACs/PVCs: She reported palpitations.  Echocardiogram 01/2024 showed EF 60 to 65%, grade 2 diastolic dysfunction, normal RV function, moderate elevated pulmonary pressures, severe left atrial enlargement, no significant valvular disease.  Zio patch x 9 days 01/2024 showed 1925 episodes of SVT, longest lasting 13 seconds, frequent  PACs (11.7%) supraventricular couplets (7.3%) supraventricular couplets (3.7%), and frequent PVCs (7.2%). - Will increase atenolol  to 75 mg daily.  Given very frequent SVT along with frequent PACs/PVCs despite beta-blocker and patient continuing to report palpitations, recommend EP referral  Pulmonary hypertension: Moderate pulmonary hypertension on echocardiogram.  Suspect likely group 2 due to diastolic heart failure, echo shows G2DD.  Recommend RHC for further evaluation, she declines, states that she will think about it.  Euvolemic on exam  Hypertension: On atenolol  75 mg daily and olmesartan 5 mg daily.  BP markedly elevated in clinic today, but she denies any symptoms and reports BP under better control at home, though still elevated.  Renal artery duplex 04/2023 showed no evidence of renal artery stenosis - Check CMET, TSH,renin/aldosterone - Increase olmesartan to 10 mg daily - Asked to check BP twice daily and bring log and home BP monitor to calibrate to appointment with pharmacy hypertension clinic in 2 weeks  Hyperlipidemia: LDL 106 on 08/28/2023.  Recommend calcium score for further risk stratification  RTC in 3 months   Medication  Adjustments/Labs and Tests Ordered: Current medicines are reviewed at length with the patient today.  Concerns regarding medicines are outlined above.  Orders Placed This Encounter  Procedures   Aldosterone + renin activity w/ ratio   Comprehensive metabolic panel with GFR   TSH   CBC   [facility]   Ambulatory referral to Cardiac Electrophysiology   Meds ordered this encounter  Medications   olmesartan (BENICAR) 20 MG tablet    Sig: Take 0.5 tablets (10 mg total) by mouth daily.    Dispense:  45 tablet    Refill:  3    Patient Instructions  Medication Instructions:   START  TAKING :  OLMESARTAN  10 MG ONCE  A DAY    FOR  2 WEEKS :  KEEP A LOG  TAKE BLOOD  PRESSURE  TWICE  A DAY   30-40 MINUTES AFTER TAKING  MEDICINES   MAKE SURE ON  PHARM -D VISIT  YOU BRING BLOOD PRESSURE LOG AND BLOOD PRESSURE MACHINE   *If you need a refill on your cardiac medications before your next appointment, please call your pharmacy*   Lab Work:   PLEASE GO DOWN STAIRS  [facility]  FIRST FLOOR   ( GET OFF ELEVATORS WALK TOWARDS WAITING AREA LAB LOCATED BY PHARMACY):  CMET  TSH  CBC  RENIN-ALDOSTRONE     If you have labs (blood work) drawn today and your tests are completely normal, you will receive your results only by: MyChart Message (if you have MyChart) OR A paper copy in the mail If you have any lab test that is abnormal or we need to change your treatment, we will call you to review the results.   Testing/Procedures:   PLEASE GET SCHEDULED  Non-Cardiac CT scanning, (CAT scanning), is a noninvasive, special x-ray that produces cross-sectional images of the body using x-rays and a computer. CT scans help physicians diagnose and treat medical conditions. For some CT exams, a contrast material is used to enhance visibility in the area of the body being studied. CT scans provide greater clarity and reveal more details than regular x-ray exams.     Follow-Up: At [facility], you and your health needs are our priority.  As part of our continuing mission to provide you with exceptional heart care, our providers are all part of one team.  This team includes your primary  Cardiologist (physician) and Advanced Practice Providers or APPs (Physician Assistants and Nurse Practitioners) who all work together to provide you with the care you need, when you need it.   Your next appointment:  YOU HAVE BEEN REFERRED TO  ELECTROPHYSIOLOGY  FOR SVT     YOU HAVE BEEN REFERRED TO  PHARM-D HYPERTENSION    AND   3 month(s)   Provider:   Dr. Kate       We recommend signing up for the patient portal called MyChart.  Sign up information is provided on this After Visit Summary.  MyChart is used to connect with patients for Virtual Visits (Telemedicine).  Patients are able to view lab/test results, encounter notes, upcoming appointments, etc.  Non-urgent messages can be sent to your provider as well.   To learn more about what you can do with MyChart, go to [email or web address].   Other Instructions            Signed, Lonni LITTIE Kate, MD  08/22/2024 11:12 PM    [facility]

## 2024-08-20 DIAGNOSIS — R058 Other specified cough: Secondary | ICD-10-CM | POA: Diagnosis not present

## 2024-08-20 DIAGNOSIS — I10 Essential (primary) hypertension: Secondary | ICD-10-CM | POA: Diagnosis not present

## 2024-08-20 DIAGNOSIS — F411 Generalized anxiety disorder: Secondary | ICD-10-CM | POA: Diagnosis not present

## 2024-08-22 ENCOUNTER — Telehealth: Payer: Self-pay | Admitting: Cardiology

## 2024-08-22 ENCOUNTER — Ambulatory Visit: Attending: Cardiology | Admitting: Cardiology

## 2024-08-22 ENCOUNTER — Encounter: Payer: Self-pay | Admitting: Cardiology

## 2024-08-22 VITALS — BP 226/78 | HR 97 | Ht 62.0 in | Wt 126.0 lb

## 2024-08-22 DIAGNOSIS — R002 Palpitations: Secondary | ICD-10-CM

## 2024-08-22 DIAGNOSIS — E785 Hyperlipidemia, unspecified: Secondary | ICD-10-CM | POA: Diagnosis not present

## 2024-08-22 DIAGNOSIS — I471 Supraventricular tachycardia, unspecified: Secondary | ICD-10-CM

## 2024-08-22 DIAGNOSIS — I272 Pulmonary hypertension, unspecified: Secondary | ICD-10-CM

## 2024-08-22 DIAGNOSIS — I1A0 Resistant hypertension: Secondary | ICD-10-CM | POA: Diagnosis not present

## 2024-08-22 MED ORDER — OLMESARTAN MEDOXOMIL 20 MG PO TABS: 10.0000 mg | ORAL_TABLET | Freq: Every day | ORAL | 3 refills | Status: DC

## 2024-08-22 NOTE — Telephone Encounter (Signed)
 Patient states she was going over her AVS and she noticed some medications are missing and she would like for the nurse to give her a call to go over her medication list with her.

## 2024-08-22 NOTE — Telephone Encounter (Signed)
 Went over medication list with the patient. Updated med list.

## 2024-08-22 NOTE — Patient Instructions (Addendum)
 Medication Instructions:   START  TAKING :  OLMESARTAN  10 MG ONCE  A DAY    FOR  2 WEEKS :  KEEP A LOG  TAKE BLOOD  PRESSURE  TWICE  A DAY   30-40 MINUTES AFTER TAKING  MEDICINES   MAKE SURE ON  PHARM -D VISIT  YOU BRING BLOOD PRESSURE LOG AND BLOOD PRESSURE MACHINE   *If you need a refill on your cardiac medications before your next appointment, please call your pharmacy*   Lab Work:   PLEASE GO DOWN STAIRS  [facility]  FIRST FLOOR   ( GET OFF ELEVATORS WALK TOWARDS WAITING AREA LAB LOCATED BY PHARMACY):  CMET  TSH  CBC  RENIN-ALDOSTRONE     If you have labs (blood work) drawn today and your tests are completely normal, you will receive your results only by: MyChart Message (if you have MyChart) OR A paper copy in the mail If you have any lab test that is abnormal or we need to change your treatment, we will call you to review the results.   Testing/Procedures:   PLEASE GET SCHEDULED  Non-Cardiac CT scanning, (CAT scanning), is a noninvasive, special x-ray that produces cross-sectional images of the body using x-rays and a computer. CT scans help physicians diagnose and treat medical conditions. For some CT exams, a contrast material is used to enhance visibility in the area of the body being studied. CT scans provide greater clarity and reveal more details than regular x-ray exams.     Follow-Up: At [facility], you and your health needs are our priority.  As part of our continuing mission to provide you with exceptional heart care, our providers are all part of one team.  This team includes your primary Cardiologist (physician) and Advanced Practice Providers or APPs (Physician Assistants and Nurse Practitioners) who all work together to provide you with the care you need, when you need it.   Your next appointment:  YOU HAVE BEEN REFERRED TO  ELECTROPHYSIOLOGY  FOR SVT     YOU HAVE BEEN REFERRED TO  PHARM-D HYPERTENSION    AND   3 month(s)   Provider:   Dr. Kate        We recommend signing up for the patient portal called MyChart.  Sign up information is provided on this After Visit Summary.  MyChart is used to connect with patients for Virtual Visits (Telemedicine).  Patients are able to view lab/test results, encounter notes, upcoming appointments, etc.  Non-urgent messages can be sent to your provider as well.   To learn more about what you can do with MyChart, go to [email or web address].   Other Instructions

## 2024-08-26 LAB — COMPREHENSIVE METABOLIC PANEL WITH GFR
ALT: 18 IU/L (ref 0–32)
AST: 38 IU/L (ref 0–40)
Albumin: 4.7 g/dL (ref 3.8–4.8)
Alkaline Phosphatase: 103 IU/L (ref 49–135)
BUN/Creatinine Ratio: 14 (ref 12–28)
BUN: 11 mg/dL (ref 8–27)
Bilirubin Total: 0.7 mg/dL (ref 0.0–1.2)
CO2: 27 mmol/L (ref 20–29)
Calcium: 9.4 mg/dL (ref 8.7–10.3)
Chloride: 99 mmol/L (ref 96–106)
Creatinine, Ser: 0.8 mg/dL (ref 0.57–1.00)
Globulin, Total: 2.7 g/dL (ref 1.5–4.5)
Glucose: 96 mg/dL (ref 70–99)
Potassium: 3.8 mmol/L (ref 3.5–5.2)
Sodium: 143 mmol/L (ref 134–144)
Total Protein: 7.4 g/dL (ref 6.0–8.5)
eGFR: 78 mL/min/1.73 (ref >59)

## 2024-08-26 LAB — CBC
Hematocrit: 41.9 % (ref 34.0–46.6)
Hemoglobin: 13.2 g/dL (ref 11.1–15.9)
MCH: 29.1 pg (ref 26.6–33.0)
MCHC: 31.5 g/dL (ref 31.5–35.7)
MCV: 93 fL (ref 79–97)
Platelets: 265 x10E3/uL (ref 150–450)
RBC: 4.53 x10E6/uL (ref 3.77–5.28)
RDW: 13.1 % (ref 11.7–15.4)
WBC: 4.9 x10E3/uL (ref 3.4–10.8)

## 2024-08-26 LAB — TSH: TSH: 2.92 u[IU]/mL (ref 0.450–4.500)

## 2024-08-26 LAB — ALDOSTERONE + RENIN ACTIVITY W/ RATIO
Aldos/Renin Ratio: 12.4 (ref 0.0–30.0)
Aldosterone: 3.4 ng/dL (ref 0.0–30.0)
Renin Activity, Plasma: 0.275 ng/mL/h (ref 0.167–5.380)

## 2024-08-27 ENCOUNTER — Ambulatory Visit: Payer: Self-pay | Admitting: Cardiology

## 2024-08-27 DIAGNOSIS — I10 Essential (primary) hypertension: Secondary | ICD-10-CM

## 2024-08-27 DIAGNOSIS — F411 Generalized anxiety disorder: Secondary | ICD-10-CM | POA: Diagnosis not present

## 2024-08-27 DIAGNOSIS — F341 Dysthymic disorder: Secondary | ICD-10-CM | POA: Diagnosis not present

## 2024-09-02 ENCOUNTER — Telehealth: Payer: Self-pay | Admitting: Cardiology

## 2024-09-02 NOTE — Telephone Encounter (Signed)
Pt returned nurse call.

## 2024-09-02 NOTE — Telephone Encounter (Signed)
 Were you trying to get in contact with this patient? I do not see a telephone encounter in the chart.

## 2024-09-03 DIAGNOSIS — F411 Generalized anxiety disorder: Secondary | ICD-10-CM | POA: Diagnosis not present

## 2024-09-03 DIAGNOSIS — F341 Dysthymic disorder: Secondary | ICD-10-CM | POA: Diagnosis not present

## 2024-09-03 NOTE — Telephone Encounter (Signed)
  error

## 2024-09-03 NOTE — Telephone Encounter (Signed)
 Pt said the medication was giving her headaches and was causing her dizziness so she stopped taking taking the 20 mg and went back to the old dosage. Please advise

## 2024-09-03 NOTE — Telephone Encounter (Signed)
 Pt calling again for a nurse. She would like to speak about olmesartan medication management, info found in 10/14 encounter. Please advise.

## 2024-09-04 DIAGNOSIS — F331 Major depressive disorder, recurrent, moderate: Secondary | ICD-10-CM | POA: Diagnosis not present

## 2024-09-04 DIAGNOSIS — I10 Essential (primary) hypertension: Secondary | ICD-10-CM | POA: Diagnosis not present

## 2024-09-04 DIAGNOSIS — I471 Supraventricular tachycardia, unspecified: Secondary | ICD-10-CM | POA: Diagnosis not present

## 2024-09-04 DIAGNOSIS — E7800 Pure hypercholesterolemia, unspecified: Secondary | ICD-10-CM | POA: Diagnosis not present

## 2024-09-04 DIAGNOSIS — M8580 Other specified disorders of bone density and structure, unspecified site: Secondary | ICD-10-CM | POA: Diagnosis not present

## 2024-09-04 DIAGNOSIS — M8588 Other specified disorders of bone density and structure, other site: Secondary | ICD-10-CM | POA: Diagnosis not present

## 2024-09-04 DIAGNOSIS — F411 Generalized anxiety disorder: Secondary | ICD-10-CM | POA: Diagnosis not present

## 2024-09-04 DIAGNOSIS — I272 Pulmonary hypertension, unspecified: Secondary | ICD-10-CM | POA: Diagnosis not present

## 2024-09-04 DIAGNOSIS — Z0000 Encounter for general adult medical examination without abnormal findings: Secondary | ICD-10-CM | POA: Diagnosis not present

## 2024-09-04 NOTE — Telephone Encounter (Signed)
 Mailed lab 10/9 to pt home address as requested.

## 2024-09-04 NOTE — Telephone Encounter (Signed)
 [phone] current number to call for pt  She would like a c/b to discuss medication please advise

## 2024-09-04 NOTE — Telephone Encounter (Signed)
 Pt calling in because she is having difficulty w/ instructions and how to take my medications.  She reports that she did increase as advised but experienced dizziness/HA and returned to original dose of 5mg  daily.   She is going to try to go back on the increased recommended dose of 10 mg and see if SE return.   Advised pt to call back if symptoms return after increasing. She will stop by the office end of next week for follow up lab work recommended. She appreciates the call back and discussion.   Forwarding to provider for his FYI.

## 2024-09-05 NOTE — Telephone Encounter (Signed)
 Called and spoke to patient and per patient she is not able to score tablets Olmesartan 20 mg that was picked up from her pharmacy. Per patient and provider recommendations, pt should take Olmesartan 10 mg daily. Writer called Pharmacy at [facility] and spoke to [place], Associate Professor. Per Heron, have pt come to pharmacy and she will score the tablets for  the patient. Patient made aware and  pt verbalized an understanding. Made patient aware to call office if medications can not be scored and then provider will be made aware. Pt verbalized an understanding.

## 2024-09-06 ENCOUNTER — Ambulatory Visit: Attending: Cardiovascular Disease | Admitting: Cardiovascular Disease

## 2024-09-06 VITALS — BP 128/78 | HR 137 | Ht 62.0 in | Wt 134.0 lb

## 2024-09-06 DIAGNOSIS — I480 Paroxysmal atrial fibrillation: Secondary | ICD-10-CM

## 2024-09-06 DIAGNOSIS — I493 Ventricular premature depolarization: Secondary | ICD-10-CM

## 2024-09-06 DIAGNOSIS — I471 Supraventricular tachycardia, unspecified: Secondary | ICD-10-CM

## 2024-09-06 DIAGNOSIS — R002 Palpitations: Secondary | ICD-10-CM

## 2024-09-06 MED ORDER — APIXABAN 5 MG PO TABS: 5.0000 mg | ORAL_TABLET | Freq: Two times a day (BID) | ORAL | 1 refills | Status: AC

## 2024-09-06 MED ORDER — ATENOLOL 100 MG PO TABS: 100.0000 mg | ORAL_TABLET | Freq: Every day | ORAL | 3 refills | Status: DC

## 2024-09-06 NOTE — Progress Notes (Signed)
 Electrophysiology Office Note:    Date:  09/06/2024   ID:  Destiny Elliott, DOB 10-Nov-1951, MRN [id number]  PCP:  Verena Mems, MD   [facility] Providers Cardiologist:  None     Referring MD: Kate Lonni CROME*   History of Present Illness:    Destiny Elliott is a 73 y.o. female with a medical history significant for SVT, hypertension, hyperlipidemia and bipolar disorder, referred for arrhythmia management.       History of Present Illness The patient was referred to cardiology by Dr. Chrystal for palpitations, initially seen by Dr. Barnetta in February 2025.  She wore a monitor in ferry 2025 that showed this is of SVT and frequent atrial and ventricular ectopy.  Over the past several days, she has noticed that her blood pressure has been lower, she has felt palpitations and general malaise.  On her blood pressure, which she checks twice daily, her heart rate readings have been typically about 100 - 105 bpm.  Her dose of olmesartan was changed to 10 mg.   She is concerned about starting new medications, particularly blood thinners like Eliquis, due to anxiety and advice from friends. She is supposed to monitor her blood pressure twice daily but did not have time today.         Today, she reports that she feels well and has no complaints  EKGs/Labs/Other Studies Reviewed Today:     Echocardiogram:  TTE February 01, 2024 LVEF 60 to 65%.  Grade 2 diastolic dysfunction.  No LVH.  Severely dilated left atrium.  Normal mitral valve function with mild prolapse.   Monitors:  9 day monitor February 2025-- my interpretation Sinus rhythm, heart rate 45 to 89 bpm, average 67 bpm Frequent supraventricular ectopy, burden 11.7%, often occurring in runs lasting up to 23 seconds Frequent PVCs, burden 7.2%. No symptom episodes reported  Advanced imaging:  CT cardiac scoring Ordered, result pending   EKG:   EKG Interpretation Date/Time:  Friday September 06 2024 13:59:09 EDT Ventricular Rate:  117 PR Interval:    QRS Duration:  80 QT Interval:  302 QTC Calculation: 421 R Axis:   89  Text Interpretation: coarse atrial fibrillation Poor R wave progression In comparison to previous ECG ECG is more consistent with coarse atrial fibrillation than typical flutter Confirmed by Nancey Scotts [phone]) on 09/06/2024 2:59:22 PM     Physical Exam:    VS:  BP 128/78 (BP Location: Left Arm, Patient Position: Sitting, Cuff Size: Normal)   Pulse (!) 137   Ht 5' 2 (1.575 m)   Wt 134 lb (60.8 kg)   LMP 10/14/2002 (Approximate)   SpO2 96%   BMI 24.51 kg/m     Wt Readings from Last 3 Encounters:  09/06/24 134 lb (60.8 kg)  08/22/24 126 lb (57.2 kg)  07/16/24 130 lb (59 kg)     GEN: Well nourished, well developed in no acute distress CARDIAC: iRRR, no murmurs, rubs, gallops RESPIRATORY:  Normal work of breathing MUSCULOSKELETAL: no edema    ASSESSMENT & PLAN:     Atrial fibrillation and flutter Newly diagnosed in office today With RVR, heart rates 137 in office.  She reports rates of about 100 bpm at home Will increase atenolol  to 100 mg to improve rate control She is reluctant to start an antiemetic drug at this time We will go and set up DC cardioversion --3 weeks after starting anticoagulation Start apixaban 5 mg p.o. twice daily I recommended starting flecainide ,  but she wants to defer for now I recommended that she proceed with getting her coronary calcium score CT done We briefly discussed ablation procedure and we will readdress this in follow-up I asked her to get a pulse oximeter so she can check her heart rate multiple times daily.  I instructed her to call us  if her heart rate is greater than 110 bpm, on average, at rest.   Frequent ectopy --atrial and ventricular Now with A-fib   Secondary hypercoagulable state CHA2DS2-VASc score at least 3 Start apixaban 5 mg p.o. twice daily She is reluctant.  I impressed upon her  the risk of stroke with AF.  She has no history of bleeding complications  Anxiety Having some difficulty processing this information today  I spent 65 minutes on this visit, reviewing notes by Dr. Barnetta, ER notes, prior studies including independent interpretation of her monitor, and a very prolonged visit with extensive counseling regarding her new diagnosis of atrial fibrillation.  Signed, Eulas FORBES Furbish, MD  09/06/2024 5:18 PM     [facility]

## 2024-09-06 NOTE — Patient Instructions (Addendum)
 Medication Instructions:  Your physician has recommended you make the following change in your medication:   ** Increase Atenolol  100mg  - 1 tablet by mouth daily  ** Begin Eliquis 5mg  - 1 tablet by mouth twice daily.  *If you need a refill on your cardiac medications before your next appointment, please call your pharmacy*  Lab Work: None ordered.  If you have labs (blood work) drawn today and your tests are completely normal, you will receive your results only by: MyChart Message (if you have MyChart) OR A paper copy in the mail If you have any lab test that is abnormal or we need to change your treatment, we will call you to review the results.  Testing/Procedures: Call if you decide to move forward with the cardioversion. Your physician has recommended that you have a Cardioversion (DCCV). Electrical Cardioversion uses a jolt of electricity to your heart either through paddles or wired patches attached to your chest. This is a controlled, usually prescheduled, procedure. Defibrillation is done under light anesthesia in the hospital, and you usually go home the day of the procedure. This is done to get your heart back into a normal rhythm. You are not awake for the procedure. Please see the instruction sheet given to you today.   Follow-Up: At [facility], you and your health needs are our priority.  As part of our continuing mission to provide you with exceptional heart care, our providers are all part of one team.  This team includes your primary Cardiologist (physician) and Advanced Practice Providers or APPs (Physician Assistants and Nurse Practitioners) who all work together to provide you with the care you need, when you need it.  Your next appointment:   4 weeks with Dr Nancey  We recommend signing up for the patient portal called MyChart.  Sign up information is provided on this After Visit Summary.  MyChart is used to connect with patients for Virtual Visits  (Telemedicine).  Patients are able to view lab/test results, encounter notes, upcoming appointments, etc.  Non-urgent messages can be sent to your provider as well.   To learn more about what you can do with MyChart, go to [email or web address].   Other Instructions  Please purchase a pulse oximeter to monitor your heart rate.If heart rate in greater than 110 please contact our office at [phone]

## 2024-09-10 DIAGNOSIS — F341 Dysthymic disorder: Secondary | ICD-10-CM | POA: Diagnosis not present

## 2024-09-10 DIAGNOSIS — F411 Generalized anxiety disorder: Secondary | ICD-10-CM | POA: Diagnosis not present

## 2024-09-13 ENCOUNTER — Other Ambulatory Visit: Payer: Self-pay | Admitting: *Deleted

## 2024-09-13 DIAGNOSIS — I10 Essential (primary) hypertension: Secondary | ICD-10-CM | POA: Diagnosis not present

## 2024-09-14 LAB — BASIC METABOLIC PANEL WITH GFR
BUN/Creatinine Ratio: 14 (ref 12–28)
BUN: 14 mg/dL (ref 8–27)
CO2: 25 mmol/L (ref 20–29)
Calcium: 8.9 mg/dL (ref 8.7–10.3)
Chloride: 99 mmol/L (ref 96–106)
Creatinine, Ser: 0.98 mg/dL (ref 0.57–1.00)
Glucose: 137 mg/dL — ABNORMAL HIGH (ref 70–99)
Potassium: 3.3 mmol/L — ABNORMAL LOW (ref 3.5–5.2)
Sodium: 143 mmol/L (ref 134–144)
eGFR: 61 mL/min/1.73 (ref >59)

## 2024-09-15 ENCOUNTER — Ambulatory Visit: Payer: Self-pay | Admitting: Cardiology

## 2024-09-15 DIAGNOSIS — I471 Supraventricular tachycardia, unspecified: Secondary | ICD-10-CM

## 2024-09-15 DIAGNOSIS — I10 Essential (primary) hypertension: Secondary | ICD-10-CM

## 2024-09-15 DIAGNOSIS — R002 Palpitations: Secondary | ICD-10-CM

## 2024-09-15 DIAGNOSIS — E876 Hypokalemia: Secondary | ICD-10-CM

## 2024-09-16 ENCOUNTER — Telehealth: Payer: Self-pay | Admitting: Cardiology

## 2024-09-16 NOTE — Telephone Encounter (Signed)
 Patient calling for her lab results

## 2024-09-16 NOTE — Telephone Encounter (Signed)
 Shared lab results with patient, per Dr. Kate:  Low potassium, otherwise no significant abnormalities. Recommend starting potassium chloride  20 meq daily, repeat BMET in 1 week    Patient asked if she could just eat a banana daily to help bring potassium level up. She does not want to take potassium supplement d/t the pills being so large. Informed patient we could send in 10 meq capsules that should could take 2 of daily. Patient would like us  to ask Dr. Kate if OK for her to just eat a banana daily.  Informed patient we will need her to return for repeat BMET regardless, and to plan to have that drawn Monday 09/23/24. Patient verbalized understanding.

## 2024-09-17 NOTE — Telephone Encounter (Signed)
 Sure we can try that and recheck BMET in 1 week

## 2024-09-18 DIAGNOSIS — F331 Major depressive disorder, recurrent, moderate: Secondary | ICD-10-CM | POA: Diagnosis not present

## 2024-09-18 DIAGNOSIS — F4310 Post-traumatic stress disorder, unspecified: Secondary | ICD-10-CM | POA: Diagnosis not present

## 2024-09-18 DIAGNOSIS — F411 Generalized anxiety disorder: Secondary | ICD-10-CM | POA: Diagnosis not present

## 2024-09-23 ENCOUNTER — Telehealth: Payer: Self-pay | Admitting: Cardiology

## 2024-09-23 DIAGNOSIS — I4891 Unspecified atrial fibrillation: Secondary | ICD-10-CM

## 2024-09-23 NOTE — Telephone Encounter (Signed)
 STAT if HR is under 50 or over 120 (normal HR is 60-100 beats per minute)  What is your heart rate?   Today - 135, yesterday 136  Do you have a log of your heart rate readings (document readings)?   No  Do you have any other symptoms?  No  Patient noted she has a new oximeter and is concerned she may not be reading it correctly.  Patient noted she has taken her morning medications.

## 2024-09-23 NOTE — Telephone Encounter (Signed)
 I called and spoke with the patient. She is stating her pulse rate is elevated to 135 but not symptomatic. She only noticed this because she was taking her daily blood pressure reading. Her blood pressures are ranging from 130s-160s/120. But she is nervous and wants to know what she can do. I told her to continue taking her meds, log her blood pressure and gave ED precautions. She is coming in to see our pharmacy team in 4 days and has a follow up with Dr. Mealor in the next 2 weeks as well. At this time I did not recommend coming to see us  but to continue to monitor her readings. Any further advise is appreciated.

## 2024-09-24 DIAGNOSIS — E876 Hypokalemia: Secondary | ICD-10-CM | POA: Diagnosis not present

## 2024-09-24 DIAGNOSIS — R002 Palpitations: Secondary | ICD-10-CM | POA: Diagnosis not present

## 2024-09-25 LAB — BASIC METABOLIC PANEL WITH GFR
BUN/Creatinine Ratio: 14 (ref 12–28)
BUN: 16 mg/dL (ref 8–27)
CO2: 25 mmol/L (ref 20–29)
Calcium: 8.8 mg/dL (ref 8.7–10.3)
Chloride: 103 mmol/L (ref 96–106)
Creatinine, Ser: 1.16 mg/dL — ABNORMAL HIGH (ref 0.57–1.00)
Glucose: 135 mg/dL — ABNORMAL HIGH (ref 70–99)
Potassium: 4.2 mmol/L (ref 3.5–5.2)
Sodium: 144 mmol/L (ref 134–144)
eGFR: 50 mL/min/1.73 — ABNORMAL LOW (ref >59)

## 2024-09-25 NOTE — Telephone Encounter (Signed)
 Spoke with pt and advised per Dr Nancey pt needs to be further evaluated in Afib clinic for rate control.  Appointment scheduled for 09/26/2024 at 930am.  Pt is agreeable with current plan.

## 2024-09-25 NOTE — Telephone Encounter (Signed)
 On pulse ox/BP monitor:  HRs averaging 110s - 130s, since last Friday. Takes pulse twice a day -- HRs: 11/8   138, 129 11/9   117. 136 11/10   135, 134 11/11   101  SBP: 148, 143, 152, 157  Few dizzy spells Head feels creepy/crawly sensations Ankles swelling somewhat, but can still get her shoes on Denies SOB, syncope Pt already has follow up appt with Dr. Nancey next Friday 11/21. Aware forwarding to him to get recommendation/s until she can be seen next week.

## 2024-09-26 ENCOUNTER — Ambulatory Visit (HOSPITAL_COMMUNITY)
Admission: RE | Admit: 2024-09-26 | Discharge: 2024-09-26 | Disposition: A | Discharge status: Home / Self Care | Source: Ambulatory Visit | Attending: Physician Assistant | Admitting: Physician Assistant

## 2024-09-26 VITALS — BP 160/110 | HR 134 | Ht 62.0 in | Wt 131.4 lb

## 2024-09-26 DIAGNOSIS — D6869 Other thrombophilia: Secondary | ICD-10-CM

## 2024-09-26 DIAGNOSIS — I480 Paroxysmal atrial fibrillation: Secondary | ICD-10-CM | POA: Insufficient documentation

## 2024-09-26 DIAGNOSIS — I4819 Other persistent atrial fibrillation: Secondary | ICD-10-CM | POA: Diagnosis not present

## 2024-09-26 DIAGNOSIS — I4891 Unspecified atrial fibrillation: Secondary | ICD-10-CM | POA: Diagnosis not present

## 2024-09-26 DIAGNOSIS — I484 Atypical atrial flutter: Secondary | ICD-10-CM

## 2024-09-26 NOTE — Progress Notes (Unsigned)
 diltiPatient ID: Destiny Elliott                 DOB: 1951/02/23                      MRN: [id number]      HPI: Destiny Elliott is a 73 y.o. female referred by Destiny Elliott to HTN clinic. PMH is significant for SVT, hypertension, hyperlipidemia. Presented today for hypertension clinic   Was put on hydrochlorothiazide  25 mg daily it was d/c by patient then olmesartan was started and she continued to take atenolol  75 mg daily. At last visit with MD 08/22/2024 olmesartan was increased to 10 mg daily. Aldosterone-renin ration was WNL post BMP K level was low so supplement was added 20 mEq daily. K level improved patient was advised to stay hydrated as mild worsening renal function noted.   Patient presented today for hypertension follow up. Reports her BP and heart rate lately staying high. She is stressed about her husband health as he is diabetic and does not look after himself well. She brought in her home BP monitor found to be accurate. She states she does not add salt to her food but they eat out 3 meals most days of the week. She enjoys potato chips ( picks lightly salted) eats frozen dinner 2 times per week. She will be going for cardioversion on Monday.     Current HTN meds: atenolol  100 mg daily, Olmesartan 10 mg daily  Previously tried: amlodipine - can't remember the reaction  BP goal: <130/80    Diet: eats out most meals most days of the week - advised to cut back as outside food would be high in sodium.   Exercise: none    Home BP readings: varies a lot - from past 2 weeks 150-170/ 90-110 and her HR 110 and some days higher    Wt Readings from Last 3 Encounters:  09/26/24 131 lb 6.4 oz (59.6 kg)  09/06/24 134 lb (60.8 kg)  08/22/24 126 lb (57.2 kg)   BP Readings from Last 3 Encounters:  09/27/24 (!) 167/122  09/26/24 (!) 160/110  09/06/24 128/78   Pulse Readings from Last 3 Encounters:  09/27/24 (!) 130  09/26/24 (!) 134  09/06/24 (!) 137    Renal  function: Estimated Creatinine Clearance: 34.2 mL/min (A) (by C-G formula based on SCr of 1.16 mg/dL (H)).  Past Medical History:  Diagnosis Date   Abnormal Pap smear 1994   --CIN I   Allergic rhinitis    Anxiety    Dense breasts    Endocervical polyp 01/1993   -benign   Hypercholesterolemia    Hypertension    Osteopenia     Current Outpatient Medications on File Prior to Visit  Medication Sig Dispense Refill   apixaban (ELIQUIS) 5 MG TABS tablet Take 1 tablet (5 mg total) by mouth 2 (two) times daily. 180 tablet 1   ARIPiprazole  (ABILIFY ) 2 MG tablet Take 2 mg by mouth daily.     atenolol  (TENORMIN ) 100 MG tablet Take 1 tablet (100 mg total) by mouth daily. 90 tablet 3   cholecalciferol (VITAMIN D3) 10 MCG (400 UNIT) TABS tablet Take 400 Units by mouth daily as needed (low vitamin D  levels).     loratadine (CLARITIN) 10 MG tablet Take 10 mg by mouth daily. (Patient taking differently: Take 10 mg by mouth as needed.)     sodium chloride  (OCEAN) 0.65 % SOLN nasal spray Place into  both nostrils daily.     No current facility-administered medications on file prior to visit.    Allergies  Allergen Reactions   Ambien [Zolpidem Tartrate] Other (See Comments)    Causes depression   Gabapentin Other (See Comments)    Hypertension   Nickel Hives and Itching   Nortriptyline Other (See Comments)    Causes dry sinuses,dry mouth   Paxil [Paroxetine Hcl] Other (See Comments)    Agitation   Trazodone  Hcl Other (See Comments)    Agitation   Zoloft [Sertraline Hcl] Other (See Comments)    Agitation   Amlodipine Besylate Other (See Comments)    Reaction type/severity unknown   Dextromethorphan-Guaifenesin Other (See Comments)   Flonase [Fluticasone Propionate] Other (See Comments)    Reaction type/severity unknown    Remeron [Mirtazapine] Other (See Comments)    Agitation and depression   Tylenol  Pm Extra [Diphenhydramine -Apap (Sleep)] Other (See Comments)    Causes nasal dryness    Vitamin D  Analogs     doesn't take Vit D supplements because they make her feel bad    Zolpidem     Other Reaction(s): personality change   Benadryl  [Diphenhydramine  Hcl] Other (See Comments)    Causes nasal dryness   Buspar [Buspirone] Other (See Comments)    Abnormal sensory issues   Nirmatrelvir-Ritonavir Diarrhea    Other Reaction(s): diarrhea    Blood pressure (!) 167/122, pulse (!) 130, last menstrual period 10/14/2002, SpO2 99%.   Assessment/Plan:  1. Hypertension -  Problem  Hypertension   Current HTN meds: atenolol  100 mg daily, Olmesartan 10 mg daily  Previously tried: amlodipine - can't remember the reaction  BP goal: <130/80     Hypertension Assessment: BP is uncontrolled in office BP 154/117 mmHg heart rate was very high  Takes and tolerates current BP/atrial fibrilation well without any side effects  Denies SOB, palpitation, chest pain, headaches,or swelling Eats out 3 meals most day of the week  Reiterated the importance of regular exercise and low salt diet   Given elevated heart rate will add diltiazem - will be going for cardioversion   Plan:  Start taking diltiazem ER 120 mg daily and increase olmesartan dose from 10 mg daily to 20 mg daily  ( 1/2 tab of 20 mg to 1 tab 20 mg daily)  Continue taking atenolol  100 mg daily  Patient to keep record of BP readings with heart rate and report to us  at the next visit Patient to see PharmD in 3 weeks for follow up  Follow up lab(s) : bmp on Oct 09 2024       Thank you  Destiny Elliott, Pharm.D Destiny Elliott Destiny Elliott. [facility] [address], [place], [place] [address] Phone: [phone]; Fax: [phone]

## 2024-09-26 NOTE — Progress Notes (Signed)
 Primary Care Physician: Verena Mems, MD Primary Cardiologist: Lonni LITTIE Nanas, MD Electrophysiologist: Eulas FORBES Furbish, MD  Referring Physician: Dr Furbish Hands Mareesa Gathright is a 73 y.o. female with a history of SVT, HTN, HLD, bipolar disorder, atrial fibrillation who presents for follow up in the [facility].  The patient was initially diagnosed with atrial fibrillation 09/06/24 at her visit with Dr Furbish. Her atenolol  was increased for rate control and she was started on Eliquis for stroke prevention.    Patient presents today for follow up for atrial fibrillation. She is in atrial flutter today with rapid rates. She is mostly unaware but does perhaps feel more fatigued. Her heart rates at home have been consistently in the 130's bpm. No bleeding issues on anticoagulation.   Today, she denies symptoms of palpitations, chest pain, shortness of breath, orthopnea, PND, lower extremity edema, dizziness, presyncope, syncope, snoring, daytime somnolence, bleeding, or neurologic sequela. The patient is tolerating medications without difficulties and is otherwise without complaint today.    Atrial Fibrillation Risk Factors:  she does not have symptoms or diagnosis of sleep apnea. she does not have a history of rheumatic fever.   Atrial Fibrillation Management history:  Previous antiarrhythmic drugs: none Previous cardioversions: none Previous ablations: none Anticoagulation history: Eliquis  ROS- All systems are reviewed and negative except as per the HPI above.  Past Medical History:  Diagnosis Date   Abnormal Pap smear 1994   --CIN I   Allergic rhinitis    Anxiety    Dense breasts    Endocervical polyp 01/1993   -benign   Hypercholesterolemia    Hypertension    Osteopenia     Current Outpatient Medications  Medication Sig Dispense Refill   ARIPiprazole  (ABILIFY ) 2 MG tablet Take 2 mg by mouth daily.     atenolol  (TENORMIN ) 100 MG  tablet Take 1 tablet (100 mg total) by mouth daily. 90 tablet 3   loratadine (CLARITIN) 10 MG tablet Take 10 mg by mouth daily. (Patient taking differently: Take 10 mg by mouth as needed.)     olmesartan (BENICAR) 20 MG tablet Take 0.5 tablets (10 mg total) by mouth daily. 45 tablet 3   sodium chloride  (OCEAN) 0.65 % SOLN nasal spray Place into both nostrils daily.     apixaban (ELIQUIS) 5 MG TABS tablet Take 1 tablet (5 mg total) by mouth 2 (two) times daily. 180 tablet 1   No current facility-administered medications for this encounter.    Physical Exam: Ht 5' 2 (1.575 m)   Wt 59.6 kg   LMP 10/14/2002 (Approximate)   BMI 24.03 kg/m   GEN: Well nourished, well developed in no acute distress CARDIAC: Regular rate and rhythm, tachycardia, no murmurs, rubs, gallops RESPIRATORY:  Clear to auscultation without rales, wheezing or rhonchi  ABDOMEN: Soft, non-tender, non-distended EXTREMITIES:  No edema; No deformity   Wt Readings from Last 3 Encounters:  09/26/24 59.6 kg  09/06/24 60.8 kg  08/22/24 57.2 kg     EKG today demonstrates  Atrial flutter with 2:1 block Vent. rate 134 BPM PR interval 96 ms QRS duration 72 ms QT/QTcB 362/540 ms  Echo 02/01/24 demonstrated   1. Left ventricular ejection fraction, by estimation, is 60 to 65%. The  left ventricle has normal function. The left ventricle has no regional  wall motion abnormalities. Left ventricular diastolic parameters are  consistent with Grade II diastolic dysfunction (pseudonormalization). Elevated left ventricular end-diastolic pressure.   2. Right  ventricular systolic function is normal. The right ventricular  size is normal. There is moderately elevated pulmonary artery systolic  pressure.   3. Left atrial size was severely dilated.   4. The mitral valve is normal in structure. Mild mitral valve  regurgitation. No evidence of mitral stenosis. There is mild prolapse of  of the mitral valve.   5. The aortic valve is  tricuspid. Aortic valve regurgitation is not  visualized. No aortic stenosis is present.   6. The inferior vena cava is normal in size with greater than 50%  respiratory variability, suggesting right atrial pressure of 3 mmHg.    CHA2DS2-VASc Score = 3  The patient's score is based upon: CHF History: 0 HTN History: 1 Diabetes History: 0 Stroke History: 0 Vascular Disease History: 0 Age Score: 1 Gender Score: 1       ASSESSMENT AND PLAN: Persistent Atrial Fibrillation/atrial flutter (ICD10:  I48.19) The patient's CHA2DS2-VASc score is 3, indicating a 3.2% annual risk of stroke.   Patient in rapid atrial flutter today. We discussed rhythm control options.  Will plan for DCCV after 3 weeks of anticoagulation.  Check cbc today.  We also discussed AAD (flecainide ) or ablation as a long term rhythm control strategy. She would prefer medication over ablation.  Continue atenolol  100 mg daily Continue Eliquis 5 mg BID  Secondary Hypercoagulable State (ICD10:  D68.69) The patient is at significant risk for stroke/thromboembolism based upon her CHA2DS2-VASc Score of 3.  Continue Apixaban (Eliquis). No bleeding issues.   HTN Elevated today, will reassess in SR.    Follow up with Dr Nancey as scheduled.    Informed Consent   Shared Decision Making/Informed Consent The risks (stroke, cardiac arrhythmias rarely resulting in the need for a temporary or permanent pacemaker, skin irritation or burns and complications associated with conscious sedation including aspiration, arrhythmia, respiratory failure and death), benefits (restoration of normal sinus rhythm) and alternatives of a direct current cardioversion were explained in detail to Ms. Arai and she agrees to proceed.       [facility] [address] [place], [place] [address] [phone]

## 2024-09-26 NOTE — Patient Instructions (Signed)
 Cardioversion scheduled for: 09/30/24 Monday 11:30 am   - Arrive at the [facility] ([address]  and check in with ADMITTING at1 1:30 am    - Do not eat or drink anything after midnight the night prior to your procedure.   - Take all your morning medication (except diabetic medications) with a sip of water prior to arrival.  - Do NOT miss any doses of your blood thinner - if you should miss a dose or take a dose more than 4 hours late -- please notify our office immediately.  - You will not be able to drive home after your procedure. Please ensure you have a responsible adult to drive you home. You will need someone with you for 24 hours post procedure.     - Expect to be in the procedural area approximately 2 hours.   - If you feel as if you go back into normal rhythm prior to scheduled cardioversion, please notify our office immediately.   If your procedure is canceled in the cardioversion suite you will be charged a cancellation fee.

## 2024-09-27 ENCOUNTER — Emergency Department (HOSPITAL_COMMUNITY)

## 2024-09-27 ENCOUNTER — Other Ambulatory Visit: Payer: Self-pay

## 2024-09-27 ENCOUNTER — Inpatient Hospital Stay (HOSPITAL_COMMUNITY)
Admission: EM | Admit: 2024-09-27 | Discharge: 2024-10-04 | DRG: 291 | Disposition: A | Discharge status: Home Health | Attending: Internal Medicine | Admitting: Internal Medicine

## 2024-09-27 ENCOUNTER — Ambulatory Visit: Attending: Cardiovascular Disease | Admitting: Pharmacist

## 2024-09-27 ENCOUNTER — Encounter: Payer: Self-pay | Admitting: Pharmacist

## 2024-09-27 ENCOUNTER — Other Ambulatory Visit (HOSPITAL_COMMUNITY): Payer: Self-pay

## 2024-09-27 ENCOUNTER — Encounter (HOSPITAL_COMMUNITY): Payer: Self-pay

## 2024-09-27 ENCOUNTER — Ambulatory Visit (HOSPITAL_COMMUNITY): Payer: Self-pay | Admitting: Physician Assistant

## 2024-09-27 VITALS — BP 167/122 | HR 130

## 2024-09-27 DIAGNOSIS — I10 Essential (primary) hypertension: Secondary | ICD-10-CM | POA: Diagnosis present

## 2024-09-27 DIAGNOSIS — J101 Influenza due to other identified influenza virus with other respiratory manifestations: Secondary | ICD-10-CM | POA: Diagnosis not present

## 2024-09-27 DIAGNOSIS — Z87410 Personal history of cervical dysplasia: Secondary | ICD-10-CM

## 2024-09-27 DIAGNOSIS — I340 Nonrheumatic mitral (valve) insufficiency: Secondary | ICD-10-CM | POA: Diagnosis not present

## 2024-09-27 DIAGNOSIS — Z823 Family history of stroke: Secondary | ICD-10-CM

## 2024-09-27 DIAGNOSIS — I509 Heart failure, unspecified: Secondary | ICD-10-CM | POA: Diagnosis not present

## 2024-09-27 DIAGNOSIS — L2489 Irritant contact dermatitis due to other agents: Secondary | ICD-10-CM | POA: Diagnosis not present

## 2024-09-27 DIAGNOSIS — I672 Cerebral atherosclerosis: Secondary | ICD-10-CM | POA: Diagnosis not present

## 2024-09-27 DIAGNOSIS — R569 Unspecified convulsions: Secondary | ICD-10-CM | POA: Diagnosis not present

## 2024-09-27 DIAGNOSIS — Z87891 Personal history of nicotine dependence: Secondary | ICD-10-CM | POA: Diagnosis not present

## 2024-09-27 DIAGNOSIS — G928 Other toxic encephalopathy: Secondary | ICD-10-CM | POA: Diagnosis not present

## 2024-09-27 DIAGNOSIS — I110 Hypertensive heart disease with heart failure: Secondary | ICD-10-CM | POA: Diagnosis not present

## 2024-09-27 DIAGNOSIS — T7840XA Allergy, unspecified, initial encounter: Secondary | ICD-10-CM

## 2024-09-27 DIAGNOSIS — I2489 Other forms of acute ischemic heart disease: Secondary | ICD-10-CM | POA: Diagnosis present

## 2024-09-27 DIAGNOSIS — G459 Transient cerebral ischemic attack, unspecified: Secondary | ICD-10-CM | POA: Diagnosis not present

## 2024-09-27 DIAGNOSIS — I429 Cardiomyopathy, unspecified: Secondary | ICD-10-CM | POA: Diagnosis present

## 2024-09-27 DIAGNOSIS — E871 Hypo-osmolality and hyponatremia: Secondary | ICD-10-CM | POA: Diagnosis not present

## 2024-09-27 DIAGNOSIS — R7989 Other specified abnormal findings of blood chemistry: Secondary | ICD-10-CM | POA: Diagnosis present

## 2024-09-27 DIAGNOSIS — I959 Hypotension, unspecified: Secondary | ICD-10-CM | POA: Diagnosis not present

## 2024-09-27 DIAGNOSIS — F319 Bipolar disorder, unspecified: Secondary | ICD-10-CM | POA: Diagnosis present

## 2024-09-27 DIAGNOSIS — I341 Nonrheumatic mitral (valve) prolapse: Secondary | ICD-10-CM | POA: Diagnosis not present

## 2024-09-27 DIAGNOSIS — I4892 Unspecified atrial flutter: Secondary | ICD-10-CM | POA: Diagnosis present

## 2024-09-27 DIAGNOSIS — Z79899 Other long term (current) drug therapy: Secondary | ICD-10-CM | POA: Diagnosis not present

## 2024-09-27 DIAGNOSIS — Z803 Family history of malignant neoplasm of breast: Secondary | ICD-10-CM

## 2024-09-27 DIAGNOSIS — F32A Depression, unspecified: Secondary | ICD-10-CM | POA: Diagnosis present

## 2024-09-27 DIAGNOSIS — R509 Fever, unspecified: Secondary | ICD-10-CM | POA: Diagnosis not present

## 2024-09-27 DIAGNOSIS — Z8349 Family history of other endocrine, nutritional and metabolic diseases: Secondary | ICD-10-CM

## 2024-09-27 DIAGNOSIS — I4819 Other persistent atrial fibrillation: Secondary | ICD-10-CM | POA: Diagnosis not present

## 2024-09-27 DIAGNOSIS — N179 Acute kidney failure, unspecified: Secondary | ICD-10-CM | POA: Diagnosis not present

## 2024-09-27 DIAGNOSIS — R4701 Aphasia: Secondary | ICD-10-CM | POA: Diagnosis not present

## 2024-09-27 DIAGNOSIS — Z886 Allergy status to analgesic agent status: Secondary | ICD-10-CM | POA: Diagnosis not present

## 2024-09-27 DIAGNOSIS — M8580 Other specified disorders of bone density and structure, unspecified site: Secondary | ICD-10-CM | POA: Diagnosis present

## 2024-09-27 DIAGNOSIS — E876 Hypokalemia: Secondary | ICD-10-CM | POA: Diagnosis not present

## 2024-09-27 DIAGNOSIS — J810 Acute pulmonary edema: Secondary | ICD-10-CM | POA: Diagnosis not present

## 2024-09-27 DIAGNOSIS — J9601 Acute respiratory failure with hypoxia: Secondary | ICD-10-CM | POA: Diagnosis present

## 2024-09-27 DIAGNOSIS — Z8249 Family history of ischemic heart disease and other diseases of the circulatory system: Secondary | ICD-10-CM | POA: Diagnosis not present

## 2024-09-27 DIAGNOSIS — I5033 Acute on chronic diastolic (congestive) heart failure: Secondary | ICD-10-CM | POA: Diagnosis present

## 2024-09-27 DIAGNOSIS — E7800 Pure hypercholesterolemia, unspecified: Secondary | ICD-10-CM | POA: Diagnosis not present

## 2024-09-27 DIAGNOSIS — R918 Other nonspecific abnormal finding of lung field: Secondary | ICD-10-CM | POA: Diagnosis not present

## 2024-09-27 DIAGNOSIS — R0602 Shortness of breath: Secondary | ICD-10-CM | POA: Diagnosis present

## 2024-09-27 DIAGNOSIS — Z8419 Family history of other disorders of kidney and ureter: Secondary | ICD-10-CM

## 2024-09-27 DIAGNOSIS — I4891 Unspecified atrial fibrillation: Secondary | ICD-10-CM | POA: Diagnosis not present

## 2024-09-27 DIAGNOSIS — R29818 Other symptoms and signs involving the nervous system: Secondary | ICD-10-CM | POA: Diagnosis not present

## 2024-09-27 DIAGNOSIS — R079 Chest pain, unspecified: Secondary | ICD-10-CM | POA: Diagnosis not present

## 2024-09-27 DIAGNOSIS — G9389 Other specified disorders of brain: Secondary | ICD-10-CM | POA: Diagnosis not present

## 2024-09-27 DIAGNOSIS — I63521 Cerebral infarction due to unspecified occlusion or stenosis of right anterior cerebral artery: Secondary | ICD-10-CM | POA: Diagnosis not present

## 2024-09-27 DIAGNOSIS — Z7901 Long term (current) use of anticoagulants: Secondary | ICD-10-CM

## 2024-09-27 DIAGNOSIS — Z888 Allergy status to other drugs, medicaments and biological substances status: Secondary | ICD-10-CM

## 2024-09-27 DIAGNOSIS — E854 Organ-limited amyloidosis: Secondary | ICD-10-CM | POA: Diagnosis not present

## 2024-09-27 DIAGNOSIS — Z825 Family history of asthma and other chronic lower respiratory diseases: Secondary | ICD-10-CM

## 2024-09-27 DIAGNOSIS — I5021 Acute systolic (congestive) heart failure: Secondary | ICD-10-CM | POA: Diagnosis not present

## 2024-09-27 DIAGNOSIS — J811 Chronic pulmonary edema: Secondary | ICD-10-CM | POA: Diagnosis not present

## 2024-09-27 DIAGNOSIS — I680 Cerebral amyloid angiopathy: Secondary | ICD-10-CM | POA: Diagnosis present

## 2024-09-27 DIAGNOSIS — I6523 Occlusion and stenosis of bilateral carotid arteries: Secondary | ICD-10-CM | POA: Diagnosis not present

## 2024-09-27 DIAGNOSIS — J90 Pleural effusion, not elsewhere classified: Secondary | ICD-10-CM | POA: Diagnosis not present

## 2024-09-27 DIAGNOSIS — R059 Cough, unspecified: Secondary | ICD-10-CM

## 2024-09-27 DIAGNOSIS — I471 Supraventricular tachycardia, unspecified: Secondary | ICD-10-CM | POA: Diagnosis not present

## 2024-09-27 DIAGNOSIS — I517 Cardiomegaly: Secondary | ICD-10-CM | POA: Diagnosis not present

## 2024-09-27 HISTORY — DX: Paroxysmal atrial fibrillation: I48.0

## 2024-09-27 LAB — BASIC METABOLIC PANEL WITH GFR
Anion gap: 13 (ref 5–15)
BUN: 15 mg/dL (ref 8–23)
CO2: 23 mmol/L (ref 22–32)
Calcium: 8.5 mg/dL — ABNORMAL LOW (ref 8.9–10.3)
Chloride: 102 mmol/L (ref 98–111)
Creatinine, Ser: 0.92 mg/dL (ref 0.44–1.00)
GFR, Estimated: >60 mL/min (ref >60)
Glucose, Bld: 116 mg/dL — ABNORMAL HIGH (ref 70–99)
Potassium: 3.7 mmol/L (ref 3.5–5.1)
Sodium: 138 mmol/L (ref 135–145)

## 2024-09-27 LAB — CBC
HCT: 39.1 % (ref 36.0–46.0)
Hematocrit: 38.6 % (ref 34.0–46.6)
Hemoglobin: 12.4 g/dL (ref 11.1–15.9)
Hemoglobin: 12.7 g/dL (ref 12.0–15.0)
MCH: 29.5 pg (ref 26.6–33.0)
MCH: 29.4 pg (ref 26.0–34.0)
MCHC: 32.1 g/dL (ref 31.5–35.7)
MCHC: 32.5 g/dL (ref 30.0–36.0)
MCV: 92 fL (ref 79–97)
MCV: 90.5 fL (ref 80.0–100.0)
Platelets: 259 x10E3/uL (ref 150–450)
Platelets: 255 K/uL (ref 150–400)
RBC: 4.2 x10E6/uL (ref 3.77–5.28)
RBC: 4.32 MIL/uL (ref 3.87–5.11)
RDW: 13.4 % (ref 11.7–15.4)
RDW: 14.1 % (ref 11.5–15.5)
WBC: 4.9 x10E3/uL (ref 3.4–10.8)
WBC: 7.9 K/uL (ref 4.0–10.5)
nRBC: 0 % (ref 0.0–0.2)

## 2024-09-27 LAB — PROTIME-INR
INR: 1.2 (ref 0.8–1.2)
Prothrombin Time: 16.2 s — ABNORMAL HIGH (ref 11.4–15.2)

## 2024-09-27 LAB — TROPONIN I (HIGH SENSITIVITY)
Troponin I (High Sensitivity): 27 ng/L — ABNORMAL HIGH (ref <18)
Troponin I (High Sensitivity): 42 ng/L — ABNORMAL HIGH (ref <18)

## 2024-09-27 LAB — TSH: TSH: 1.129 u[IU]/mL (ref 0.350–4.500)

## 2024-09-27 LAB — MAGNESIUM: Magnesium: 2.1 mg/dL (ref 1.7–2.4)

## 2024-09-27 MED ORDER — METOPROLOL TARTRATE 25 MG PO TABS
25.0000 mg | ORAL_TABLET | Freq: Three times a day (TID) | ORAL | Status: DC
  Administered 2024-09-27: 25 mg via ORAL
  Filled 2024-09-27: qty 1

## 2024-09-27 MED ORDER — ACETAMINOPHEN 325 MG PO TABS
650.0000 mg | ORAL_TABLET | Freq: Four times a day (QID) | ORAL | Status: DC | PRN
  Administered 2024-09-28 – 2024-09-30 (×2): 650 mg via ORAL
  Filled 2024-09-27 (×2): qty 2

## 2024-09-27 MED ORDER — POTASSIUM CHLORIDE CRYS ER 20 MEQ PO TBCR
30.0000 meq | EXTENDED_RELEASE_TABLET | Freq: Once | ORAL | Status: AC
  Administered 2024-09-27: 30 meq via ORAL
  Filled 2024-09-27: qty 1

## 2024-09-27 MED ORDER — ONDANSETRON HCL 4 MG/2ML IJ SOLN: 4.0000 mg | Freq: Four times a day (QID) | INTRAMUSCULAR | Status: DC | PRN

## 2024-09-27 MED ORDER — OLMESARTAN MEDOXOMIL 20 MG PO TABS
20.0000 mg | ORAL_TABLET | Freq: Every day | ORAL | 3 refills | Status: DC
  Filled 2024-09-27 (×2): qty 90, 90d supply, fill #0

## 2024-09-27 MED ORDER — DILTIAZEM HCL-DEXTROSE 125-5 MG/125ML-% IV SOLN (PREMIX)
5.0000 mg/h | INTRAVENOUS | Status: DC
  Administered 2024-09-27: 5 mg/h via INTRAVENOUS
  Filled 2024-09-27: qty 125

## 2024-09-27 MED ORDER — DILTIAZEM HCL ER COATED BEADS 120 MG PO CP24
120.0000 mg | ORAL_CAPSULE | Freq: Every day | ORAL | 3 refills | Status: DC
  Filled 2024-09-27: qty 90, 90d supply, fill #0

## 2024-09-27 MED ORDER — ACETAMINOPHEN 650 MG RE SUPP
650.0000 mg | Freq: Four times a day (QID) | RECTAL | Status: DC | PRN
  Administered 2024-09-30: 650 mg via RECTAL
  Filled 2024-09-27: qty 1

## 2024-09-27 MED ORDER — APIXABAN 5 MG PO TABS
5.0000 mg | ORAL_TABLET | Freq: Two times a day (BID) | ORAL | Status: DC
  Administered 2024-09-28 – 2024-10-04 (×15): 5 mg via ORAL
  Filled 2024-09-27 (×15): qty 1

## 2024-09-27 MED ORDER — MELATONIN 3 MG PO TABS
3.0000 mg | ORAL_TABLET | Freq: Every evening | ORAL | Status: DC | PRN
  Filled 2024-09-27: qty 1

## 2024-09-27 NOTE — Assessment & Plan Note (Signed)
 Assessment: BP is uncontrolled in office BP 154/117 mmHg heart rate was very high  Takes and tolerates current BP/atrial fibrilation well without any side effects  Denies SOB, palpitation, chest pain, headaches,or swelling Eats out 3 meals most day of the week  Reiterated the importance of regular exercise and low salt diet   Given elevated heart rate will add diltiazem - will be going for cardioversion   Plan:  Start taking diltiazem ER 120 mg daily and increase olmesartan dose from 10 mg daily to 20 mg daily  ( 1/2 tab of 20 mg to 1 tab 20 mg daily)  Continue taking atenolol  100 mg daily  Patient to keep record of BP readings with heart rate and report to us  at the next visit Patient to see PharmD in 3 weeks for follow up  Follow up lab(s) : bmp on Oct 09 2024

## 2024-09-27 NOTE — H&P (Signed)
 History and Physical      Destiny Elliott FMW:[id number] DOB: December 05, 1950 DOA: 09/27/2024; DOS: 09/27/2024  PCP: Verena Mems, MD  Patient coming from: home   I have personally briefly reviewed patient's old medical records in [facility]  Chief Complaint: Shortness of breath  HPI: Destiny Elliott is a 73 y.o. female with medical history significant for paroxysmal atrial fibrillation recently started on Eliquis, send hypertension, chronic diastolic heart failure, who is admitted to [facility] on 09/27/2024 with atrial fibrillation with RVR after presenting from home to [facility] ED complaining of shortness of breath.   In the context of recent diagnosis of atrial fibrillation, the patient has been following with cardiology in atrial fibrillation clinic.  For thromboembolic prophylaxis, she was started on Eliquis 18 days ago, and is scheduled to undergo outpatient TEE electrocardioversion on 09/30/2024.  She had previously been on atenolol  with instructions from outpatient cardiology to recently transition atenolol  to diltiazem.  However, the patient reports that she has developed progressive shortness of breath over the last 2 days, associated with some orthopnea, new onset nonproductive cough along with some palpitations, in the absence of chest pain, diaphoresis.  She notes that she tried her first dose of diltiazem yesterday, but noted ensuing worsening in her shortness of breath after taking this medication, ultimately prompting her to present to the [facility] resurrect for further evaluation and management thereof.  Her medical history is also notable for chronic diastolic heart failure, with most recent echocardiogram performed in March 2025 notable for LVEF 66 5%, no evidence of wall motion maladies, will demonstrating grade 2 diastolic dysfunction, normal right ventricular systolic function, severely dilated left atrium, mild prolapse of the mitral valve as well as mild mitral  regurgitation.  She is not a scheduled diuretic medications at home.  No recent wheezing, subjective fever, chills, rigors, or generalized myalgias.  Denies any recent hemoptysis.  She conveys no known baseline supplemental oxygen requirements, nor any known history of chronic underlying pulmonary pathology.     ED Course:  Vital signs in the ED were notable for the following: Afebrile; heart noted to be in atrial fibrillation with RVR, with initial heart rates in the 130s to 140s, subsequently decreasing into the low 100s to 120s following initiation of AV nodal blocking intervention, as recommended by cardiology below; systolic blood pressures in the 140s to 180s; respiratory rate 20-32; initial oxygen saturations in the high 80s on room air, Sosan improving into the range of 97 to 100% on 2 L nasal cannula.  Labs were notable for the following: BMP was notable for the following: Sodium 138, potassium 3.7, creatinine 0.92, glucose 116.  Magnesium  level 2.1.  High sensitive troponin I initially 27 with repeat value trending up slightly to 42.  TSH 1.129.  CBC notable for Locasol count 7900, hemoglobin 12.7.  INR 1.2.  Urinalysis showed no white blood cells, leukocyte esterase/nitrate negative, demonstrating no evidence of bacteria.  Per my interpretation, EKG in ED demonstrated the following: EKG showed atrial fibrillation with rate 124, nonspecific T wave inversion in aVL, as well as nonspecific less than 1 mm ST depression in V4, V5, demonstrate no evidence of ST elevation.  Imaging in the ED, per corresponding formal radiology read, was notable for the following: 2 view chest x-ray showed evidence of pulmonary edema along with trace bilateral pleural effusions, while demonstrating cardiomegaly in the absence of evidence of infiltrate or pneumothorax.  EDP discussed patient's case with on-call cardiology fellow, who,  in the setting of the patient's acutely decompensated heart failure,  recommended against diltiazem drip, but rather recommended metoprolol tartrate 25 mg po tid.   While in the ED, the following were administered: Lopressor 25 mg p.o. x 1 dose, potassium chloride  30 mill colons p.o. x 1 dose.  Subsequently, the patient was admitted for further evaluation management of presenting atrial fibrillation with RVR in the setting of acute on chronic diastolic heart failure complicated by acute hypoxic respiratory distress.     Review of Systems: As per HPI otherwise 10 point review of systems negative.   Past Medical History:  Diagnosis Date   Abnormal Pap smear 1994   --CIN I   Allergic rhinitis    Anxiety    Dense breasts    Endocervical polyp 01/1993   -benign   Hypercholesterolemia    Hypertension    Osteopenia     Past Surgical History:  Procedure Laterality Date   CERVICAL BIOPSY  W/ LOOP ELECTRODE EXCISION  03/1993   CIN I   COLONOSCOPY  2016   endocervical polyp  01-27-93   -benign   TOOTH EXTRACTION  08/2017    Social History:  reports that she quit smoking about 20 years ago. Her smoking use included cigarettes. She started smoking about 48 years ago. She has a 14 pack-year smoking history. She has never used smokeless tobacco. She reports current alcohol use. She reports that she does not use drugs.   Allergies  Allergen Reactions   Ambien [Zolpidem Tartrate] Other (See Comments)    Causes depression   Gabapentin Other (See Comments)    Hypertension   Nickel Hives and Itching   Nortriptyline Other (See Comments)    Causes dry sinuses,dry mouth   Paxil [Paroxetine Hcl] Other (See Comments)    Agitation   Trazodone  Hcl Other (See Comments)    Agitation   Zoloft [Sertraline Hcl] Other (See Comments)    Agitation   Amlodipine Besylate Other (See Comments)    Reaction type/severity unknown   Dextromethorphan-Guaifenesin Other (See Comments)   Flonase [Fluticasone Propionate] Other (See Comments)    Reaction type/severity unknown     Remeron [Mirtazapine] Other (See Comments)    Agitation and depression   Tylenol  Pm Extra [Diphenhydramine -Apap (Sleep)] Other (See Comments)    Causes nasal dryness   Vitamin D  Analogs     doesn't take Vit D supplements because they make her feel bad    Zolpidem     Other Reaction(s): personality change   Benadryl  [Diphenhydramine  Hcl] Other (See Comments)    Causes nasal dryness   Buspar [Buspirone] Other (See Comments)    Abnormal sensory issues   Nirmatrelvir-Ritonavir Diarrhea    Other Reaction(s): diarrhea    Family History  Problem Relation Age of Onset   Stroke Father 88   Kidney disease Father    Hypertension Father    Hypertension Sister 40   Hyperlipidemia Sister    COPD Sister    Breast cancer Sister 40   Thyroid  disease Mother 24   Heart failure Mother    Heart disease Mother    Hypertension Mother    COPD Mother    Heart disease Brother 59       AFIB   Suicidality Son 37       07/20/2013   Thyroid  disease Sister 84    Family history reviewed and not pertinent    Prior to Admission medications   Medication Sig Start Date End Date Taking? Authorizing Provider  apixaban (ELIQUIS) 5 MG TABS tablet Take 1 tablet (5 mg total) by mouth 2 (two) times daily. 09/06/24   Mealor, Augustus E, MD  ARIPiprazole  (ABILIFY ) 2 MG tablet Take 2 mg by mouth daily. 07/19/24   [provider]  atenolol  (TENORMIN ) 100 MG tablet Take 1 tablet (100 mg total) by mouth daily. 09/06/24   Mealor, Augustus E, MD  cholecalciferol (VITAMIN D3) 10 MCG (400 UNIT) TABS tablet Take 400 Units by mouth daily as needed (low vitamin D  levels).    [provider]  diltiazem (CARDIZEM CD) 120 MG 24 hr capsule Take 1 capsule (120 mg total) by mouth daily. 09/27/24 12/26/24  Kate Lonni CROME, MD  loratadine (CLARITIN) 10 MG tablet Take 10 mg by mouth daily. Patient taking differently: Take 10 mg by mouth as needed.    [provider]  olmesartan (BENICAR) 20 MG  tablet Take 1 tablet (20 mg total) by mouth daily. 09/27/24   Kate Lonni CROME, MD  sodium chloride  (OCEAN) 0.65 % SOLN nasal spray Place into both nostrils daily.    [provider]     Objective    Physical Exam: Vitals:   09/27/24 2130 09/27/24 2215 09/27/24 2230 09/27/24 2245  BP: (!) 158/90 (!) 164/95 (!) 171/95 (!) 157/100  Pulse: (!) 126 (!) 132 (!) 107 81  Resp: (!) 28 (!) 28 (!) 25 (!) 28  Temp:      TempSrc:      SpO2: 98% 99% 98% 98%  Weight:      Height:        General: appears to be stated age; alert, oriented Skin: warm, dry, no rash Head:  AT/Bartholomew Mouth:  Oral mucosa membranes appear moist, normal dentition Neck: supple; trachea midline Heart: Tachycardic, irregular; did not appreciate any M/R/G Lungs: CTAB, did not appreciate any wheezes, rales, or rhonchi Abdomen: + BS; soft, ND, NT Vascular: 2+ pedal pulses b/l; 2+ radial pulses b/l Extremities: Trace edema in the bilateral lower extremities, no muscle wasting        Labs on Admission: I have personally reviewed following labs and imaging studies  CBC: Recent Labs  Lab 09/26/24 1117 09/27/24 1923  WBC 4.9 7.9  HGB 12.4 12.7  HCT 38.6 39.1  MCV 92 90.5  PLT 259 255   Basic Metabolic Panel: Recent Labs  Lab 09/24/24 1329 09/27/24 1923  NA 144 138  K 4.2 3.7  CL 103 102  CO2 25 23  GLUCOSE 135* 116*  BUN 16 15  CREATININE 1.16* 0.92  CALCIUM 8.8 8.5*  MG  --  2.1   GFR: Estimated Creatinine Clearance: 45.1 mL/min (by C-G formula based on SCr of 0.92 mg/dL). Liver Function Tests: No results for input(s): AST, ALT, ALKPHOS, BILITOT, PROT, ALBUMIN in the last 168 hours. No results for input(s): LIPASE, AMYLASE in the last 168 hours. No results for input(s): AMMONIA in the last 168 hours. Coagulation Profile: Recent Labs  Lab 09/27/24 1923  INR 1.2   Cardiac Enzymes: No results for input(s): CKTOTAL, CKMB, CKMBINDEX, TROPONINI in the last  168 hours. BNP (last 3 results) No results for input(s): PROBNP in the last 8760 hours. HbA1C: No results for input(s): HGBA1C in the last 72 hours. CBG: No results for input(s): GLUCAP in the last 168 hours. Lipid Profile: No results for input(s): CHOL, HDL, LDLCALC, TRIG, CHOLHDL, LDLDIRECT in the last 72 hours. Thyroid  Function Tests: Recent Labs    09/27/24 1923  TSH 1.129   Anemia Panel:  No results for input(s): VITAMINB12, FOLATE, FERRITIN, TIBC, IRON, RETICCTPCT in the last 72 hours. Urine analysis:    Component Value Date/Time   COLORURINE COLORLESS (A) 07/08/2024 1122   APPEARANCEUR CLEAR 07/08/2024 1122   LABSPEC 1.002 (L) 07/08/2024 1122   PHURINE 8.0 07/08/2024 1122   GLUCOSEU NEGATIVE 07/08/2024 1122   HGBUR SMALL (A) 07/08/2024 1122   BILIRUBINUR NEGATIVE 07/08/2024 1122   BILIRUBINUR neg 12/04/2015 1105   KETONESUR NEGATIVE 07/08/2024 1122   PROTEINUR NEGATIVE 07/08/2024 1122   UROBILINOGEN negative 12/04/2015 1105   NITRITE NEGATIVE 07/08/2024 1122   LEUKOCYTESUR NEGATIVE 07/08/2024 1122    Radiological Exams on Admission: DG Chest 2 View Result Date: 09/27/2024 EXAM: 2 VIEW(S) XRAY OF THE CHEST 09/27/2024 07:46:00 PM COMPARISON: CXR 03/07/2023 CLINICAL HISTORY: chest pain FINDINGS: LUNGS AND PLEURA: Diffuse interstitial prominence and Kerley-B lines. Trace bilateral pleural effusions. No pneumothorax. HEART AND MEDIASTINUM: Cardiomegaly. Aortic calcification. BONES AND SOFT TISSUES: No acute osseous abnormality. IMPRESSION: 1. Pulmonary edema with  trace bilateral pleural effusions. 2. Cardiomegaly. Electronically signed by: Morgane Naveau MD 09/27/2024 07:52 PM EST RP Workstation: [id number]      Assessment/Plan   Principal Problem:   Atrial fibrillation with RVR (HCC) Active Problems:   Essential hypertension   Acute on chronic diastolic CHF (congestive heart failure) (HCC)   Elevated troponin   Acute hypoxic  respiratory failure (HCC)   Depression      #) Atrial fibrillation with RVR: In the setting of a known recent dx of paroxysmal atrial fibrillation, noted to be in A-fib RVR with initial HR's in the 130s to 140s, subsequently decreasing into the low 100s to 120s following initiation of oral metoprolol to tartrate as recommended by cardiology this evening, with plan to refrain from additional diltiazem in the context of presenting acutely decompensated heart failure. BP has tolerated both the RVR as well as ensuing initiation of diltiazem drip, without any overt hypotension.  She appears mildly symptomatic from her atrial fibrillation with RVR, with shortness of breath, although the shortness of breath likely also stems from contribution from resultant acute on chronic diastolic heart failure.  ACS felt to be less likely in the absence of any recent CP, while presenting EKG shows no overt evidence of acute ischemic changes, including no evidence of STEMI.  Most recent echocardiogram occurred in March 2025, with results that are notable for severely dilated left atrium, predisposing her to recurrent versus persistent atrial fibrillation, also noting potential contributory valvular pathology in the form of mitral valve prolapse of mild severity, with mild mitral regurgitation. In the setting of CHA2DS2-VASc score of greater than 2, there is an indication for chronic anticoagulation for thromboembolic prophylaxis. Consistent with this, the patient was started on Eliquis 18 days ago.   She is scheduled to undergo electrocardioversion/TEE on 09/30/2024, corresponding with the 21-day mark of being on Eliquis.  Patient's heart rate appears to improve with prn IV Lopressor doses, without any associated development of hypotension, and without any overt evidence of existing heart failure exacerbation.  Consequently, will gently titrate up her oral metoprolol to tartrate, as below.  Plan: Monitor strict I's & O's  and daily weights. Monitor on telemetry. Check serum Mg level with prn supplementation to maintain levels of greater than or equal to 2.0. Repeat CMP/CBC in the AM.  Metoprolol tartrate 50 mg p.o. 3 times daily.  Refraining from diltiazem in the context of acutely decompensated heart failure.  Close monitoring of ensuing BP via routine VS. continue anticoagulation with  home Eliquis.  Check BNP.  Further evaluation management of resultant acute on chronic diastolic heart failure, as below, including IV diuresis via Lasix.  Echocardiogram in the morning.                          #) Acute on chronic diastolic heart failure: dx of acute decompensation on the basis of presenting to 3 days of progressive shortness of breath associated with orthopnea, cough, with chest x-ray showing evidence of interval development of pulmonary edema as well as trace bilateral pleural effusions.  As the symptoms started a few weeks after initial diagnosis of atrial fibrillation, suspect that her acutely decompensated heart failure is on the basis of underlying atrial fibrillation.  This is in the context of a known history of chronic diastolic heart failure, with most recent echocardiogram performed in March 2025, which is notable for LVEF 60 to 65%, grade 2 diastolic dysfunction, as well as mild mitral valve prolapse with mild mitral regurgitation.   I suspect that mildly elevated initial troponin is a consequence of underlying acutely decompensated heart failure as well as contribution from presenting atrial fibrillation with RVR in addition to contribution from diminished oxygen delivery capacity as a result of presenting acute hypoxic respiratory distress from her acute on chronic diastolic heart failure as opposed to representing ACS causing presenting acute heart failure exacerbation, particularly in the absence of any recent CP, and with presenting EKG showing no evidence of acute ischemic changes,  including no evidence of STEMI.   Of note, chest x-ray shows no evidence of infiltrate to suggest pneumonia, nor any evidence of pneumothorax.  Will initiate IV diuresis, as below.  Plan: monitor strict I's & O's and daily weights. Monitor on telemetry, including trend in HR in response to diuresis, as above. CMP in the morning, including for monitoring trend of potassium, bicarbonate, and renal function in response to interval diuresis efforts.  Repeat magnesium  level in the morning.  Close monitoring of ensuing blood pressure response to diuresis efforts, including to help guide need for improvement in afterload reduction in order to optimize cardiac output. Trend troponin. Lasix 40 mg IV twice daily.  Echocardiogram in the morning.  Further evaluation and management of suspected driving atrial fibrillation , as above.  Check BNP.                           #) Essential Hypertension: documented h/o such, with outpatient antihypertensive regimen including olmesartan.  SBP's in the ED today: 140s to 180s mmHg, suspected hypertensive contribution from her presenting acute on chronic diastolic heart failure  Plan: Close monitoring of subsequent BP via routine VS. IV diuresis via Lasix, as above.  Initiation of oral metoprolol tartrate, per recommendation of cardiology, as above.  Monitor strict I's and O's and daily weights.  Resume home olmesartan.                         #) Depression: documented h/o such. On Abilify  as outpatient.    Plan: Continue outpatient Abilify .      DVT prophylaxis: SCD's plus resumption of outpatient Eliquis Code Status: Full code Family Communication: none Disposition Plan: Per Rounding Team Consults called: EDP discussed patient's case with on-call cardiology fellow, who, in the setting of the patient's acutely decompensated heart failure, recommended against diltiazem drip, but rather recommended metoprolol tartrate  25 mg po tid. ;  Admission status: Inpatient     I SPENT GREATER THAN 75  MINUTES IN CLINICAL CARE TIME/MEDICAL DECISION-MAKING IN COMPLETING THIS ADMISSION.      Eva NOVAK Leshea Jaggers DO [facility]  From 7PM - 7AM   09/27/2024, 11:12 PM

## 2024-09-27 NOTE — Patient Instructions (Addendum)
 Changes made by your pharmacist Robbi Blanch, PharmD at today's visit:    Instructions/Changes  (what do you need to do) Your Notes  (what you did and when you did it)  Increase olmesartan dose from 10 mg daily to 20 mg daily ( get updated prescription from downstairs pharmacy) follow up lab is due on Nov 28    Start taking diltiazem ER 120 mg daily ( pick up prescription from down stairs pharmacy)    Lower salt intake - avoid eating out      Bring all of your meds, your BP cuff and your record of home blood pressures to your next appointment.    HOW TO TAKE YOUR BLOOD PRESSURE AT HOME  Rest 5 minutes before taking your blood pressure.  Don't smoke or drink caffeinated beverages for at least 30 minutes before. Take your blood pressure before (not after) you eat. Sit comfortably with your back supported and both feet on the floor (don't cross your legs). Elevate your arm to heart level on a table or a desk. Use the proper sized cuff. It should fit smoothly and snugly around your bare upper arm. There should be enough room to slip a fingertip under the cuff. The bottom edge of the cuff should be 1 inch above the crease of the elbow. Ideally, take 3 measurements at one sitting and record the average.  Important lifestyle changes to control high blood pressure  Intervention  Effect on the BP  Lose extra pounds and watch your waistline Weight loss is one of the most effective lifestyle changes for controlling blood pressure. If you're overweight or obese, losing even a small amount of weight can help reduce blood pressure. Blood pressure might go down by about 1 millimeter of mercury (mm Hg) with each kilogram (about 2.2 pounds) of weight lost.  Exercise regularly As a general goal, aim for at least 30 minutes of moderate physical activity every day. Regular physical activity can lower high blood pressure by about 5 to 8 mm Hg.  Eat a healthy diet Eating a diet rich in whole grains,  fruits, vegetables, and low-fat dairy products and low in saturated fat and cholesterol. A healthy diet can lower high blood pressure by up to 11 mm Hg.  Reduce salt (sodium) in your diet Even a small reduction of sodium in the diet can improve heart health and reduce high blood pressure by about 5 to 6 mm Hg.  Limit alcohol One drink equals 12 ounces of beer, 5 ounces of wine, or 1.5 ounces of 80-proof liquor.  Limiting alcohol to less than one drink a day for women or two drinks a day for men can help lower blood pressure by about 4 mm Hg.   If you have any questions or concerns please use My Chart to send questions or call the office at [phone]

## 2024-09-27 NOTE — ED Provider Notes (Signed)
 [place] EMERGENCY DEPARTMENT AT [facility] Provider Note   CSN: [id number] Arrival date & time: 09/27/24  8091     Patient presents with: No chief complaint on file.   Destiny Elliott is a 73 y.o. female.   HPI    Presents because of cough, some slight shortness of breath.  1 small episode of midsternal chest pain is since resolved.  Patient states that she did not follow-up with A-fib clinic.  Came in to the ED because worsening cough.  Maybe some slight shortness of breath.  No clear chest pain or hemoptysis.  Denies any history DVT or PE.  Patient started Eliquis on October 27 has not missed any doses.  Patient's been taking atenolol  for her A-fib.  Took a dose of diltiazem 120 mg 24-hour capsule today as well.  EMS gave patient 20 mg loading dose of diltiazem.  Previous medical history reviewed : Patient followed up with cardiology yesterday A-fib clinic.  Initially diagnosed on September 06, 2024.  Initially placed on atenolol .  Start Eliquis for stroke prevention.  Was found to be in a flutter during this presentation.  Heart rates at home and consistently in the 130s.  Will receive cardioversion after 3 weeks of anticoagulation.  Continue atenolol  100 mg daily.  Eliquis 5 mg twice daily.     Prior to Admission medications   Medication Sig Start Date End Date Taking? Authorizing Provider  apixaban (ELIQUIS) 5 MG TABS tablet Take 1 tablet (5 mg total) by mouth 2 (two) times daily. 09/06/24   Mealor, Augustus E, MD  ARIPiprazole  (ABILIFY ) 2 MG tablet Take 2 mg by mouth daily. 07/19/24   [provider]  atenolol  (TENORMIN ) 100 MG tablet Take 1 tablet (100 mg total) by mouth daily. 09/06/24   Mealor, Augustus E, MD  cholecalciferol (VITAMIN D3) 10 MCG (400 UNIT) TABS tablet Take 400 Units by mouth daily as needed (low vitamin D  levels).    [provider]  diltiazem (CARDIZEM CD) 120 MG 24 hr capsule Take 1 capsule (120 mg total) by mouth daily. 09/27/24  12/26/24  Kate Lonni CROME, MD  loratadine (CLARITIN) 10 MG tablet Take 10 mg by mouth daily. Patient taking differently: Take 10 mg by mouth as needed.    [provider]  olmesartan (BENICAR) 20 MG tablet Take 1 tablet (20 mg total) by mouth daily. 09/27/24   Kate Lonni CROME, MD  sodium chloride  (OCEAN) 0.65 % SOLN nasal spray Place into both nostrils daily.    [provider]    Allergies: Ambien [zolpidem tartrate], Gabapentin, Nickel, Nortriptyline, Paxil [paroxetine hcl], Trazodone  hcl, Zoloft [sertraline hcl], Amlodipine besylate, Dextromethorphan-guaifenesin, Flonase [fluticasone propionate], Remeron [mirtazapine], Tylenol  pm extra [diphenhydramine -apap (sleep)], Vitamin d  analogs, Zolpidem, Benadryl  [diphenhydramine  hcl], Buspar [buspirone], and Nirmatrelvir-ritonavir    Review of Systems  Constitutional:  Negative for chills and fever.  HENT:  Negative for ear pain and sore throat.   Eyes:  Negative for pain and visual disturbance.  Respiratory:  Negative for cough and shortness of breath.   Cardiovascular:  Negative for chest pain and palpitations.  Gastrointestinal:  Negative for abdominal pain and vomiting.  Genitourinary:  Negative for dysuria and hematuria.  Musculoskeletal:  Negative for arthralgias and back pain.  Skin:  Negative for color change and rash.  Neurological:  Negative for seizures and syncope.  All other systems reviewed and are negative.   Updated Vital Signs BP (!) 157/100   Pulse 81   Temp 99.3 F (37.4  C) (Oral)   Resp (!) 28   Ht 5' 3 (1.6 m)   Wt 59.6 kg   LMP 10/14/2002 (Approximate)   SpO2 98%   BMI 23.28 kg/m   Physical Exam Vitals and nursing note reviewed.  Constitutional:      General: She is not in acute distress.    Appearance: She is well-developed.  HENT:     Head: Normocephalic and atraumatic.  Eyes:     Conjunctiva/sclera: Conjunctivae normal.  Cardiovascular:     Rate and Rhythm: Normal  rate and regular rhythm.     Heart sounds: No murmur heard. Pulmonary:     Effort: Pulmonary effort is normal. No respiratory distress.     Breath sounds: Normal breath sounds.  Abdominal:     Palpations: Abdomen is soft.     Tenderness: There is no abdominal tenderness.  Musculoskeletal:        General: No swelling.     Cervical back: Neck supple.  Skin:    General: Skin is warm and dry.     Capillary Refill: Capillary refill takes less than 2 seconds.  Neurological:     Mental Status: She is alert.  Psychiatric:        Mood and Affect: Mood normal.     (all labs ordered are listed, but only abnormal results are displayed) Labs Reviewed  BASIC METABOLIC PANEL WITH GFR - Abnormal; Notable for the following components:      Result Value   Glucose, Bld 116 (*)    Calcium 8.5 (*)    All other components within normal limits  PROTIME-INR - Abnormal; Notable for the following components:   Prothrombin Time 16.2 (*)    All other components within normal limits  TROPONIN I (HIGH SENSITIVITY) - Abnormal; Notable for the following components:   Troponin I (High Sensitivity) 27 (*)    All other components within normal limits  TROPONIN I (HIGH SENSITIVITY) - Abnormal; Notable for the following components:   Troponin I (High Sensitivity) 42 (*)    All other components within normal limits  CBC  TSH  MAGNESIUM   TROPONIN I (HIGH SENSITIVITY)    EKG: None  Radiology: DG Chest 2 View Result Date: 09/27/2024 EXAM: 2 VIEW(S) XRAY OF THE CHEST 09/27/2024 07:46:00 PM COMPARISON: CXR 03/07/2023 CLINICAL HISTORY: chest pain FINDINGS: LUNGS AND PLEURA: Diffuse interstitial prominence and Kerley-B lines. Trace bilateral pleural effusions. No pneumothorax. HEART AND MEDIASTINUM: Cardiomegaly. Aortic calcification. BONES AND SOFT TISSUES: No acute osseous abnormality. IMPRESSION: 1. Pulmonary edema with  trace bilateral pleural effusions. 2. Cardiomegaly. Electronically signed by: Morgane  Naveau MD 09/27/2024 07:52 PM EST RP Workstation: [id number]     Procedures   Medications Ordered in the ED  metoprolol tartrate (LOPRESSOR) tablet 25 mg (25 mg Oral Given 09/27/24 2102)  potassium chloride  (KLOR-CON  M) CR tablet 30 mEq (30 mEq Oral Given 09/27/24 2132)    Clinical Course as of 09/27/24 2308  Fri Sep 27, 2024  2042 Metoprolol tartrate 25 mg q8 hr  [TL]    Clinical Course User Index [TL] Simon Lavonia SAILOR, MD                                 Medical Decision Making Amount and/or Complexity of Data Reviewed Labs: ordered. Radiology: ordered.  Risk Prescription drug management.     HPI:   Presents because of cough, some slight shortness of breath.  1 small  episode of midsternal chest pain is since resolved.  Patient states that she did not follow-up with A-fib clinic.  Came in to the ED because worsening cough.  Maybe some slight shortness of breath.  No clear chest pain or hemoptysis.  Denies any history DVT or PE.  Patient started Eliquis on October 27 has not missed any doses.  Patient's been taking atenolol  for her A-fib.  Took a dose of diltiazem 120 mg 24-hour capsule today as well.  EMS gave patient 20 mg loading dose of diltiazem.  Heart rate in the 130s to 150s EMS  Previous medical history reviewed : Patient followed up with cardiology yesterday A-fib clinic.  Initially diagnosed on September 06, 2024.  Initially placed on atenolol .  Start Eliquis for stroke prevention.  Was found to be in a flutter during this presentation.  Heart rates at home and consistently in the 130s.  Will receive cardioversion after 3 weeks of anticoagulation.  Continue atenolol  100 mg daily.  Eliquis 5 mg twice daily.   MDM:   Upon exam, patient heart rate in 120s.  Slightly better rate control compared to what EMS found her in.  Patient on 2 L nasal cannula.  Staff states that O2 saturation was approximately 92% whenever she was initially on 2 L so they bumped her up to 4 L.   Asked nursing staff to move it back down to 2 L and see what she does.  Remote smoking history.  No recent smoking history.  Troponin was already ordered before I saw the patient.  I have very low concern for any kind ACS process at this time.  No chest pain at this time.  EKG not consistent for STEMI or obvious ischemia.   Will obtain laboratory workup.  Will obtain electrolytes.  Obtain TSH and magnesium  as well.  Will start patient on diltiazem drip for better rate control.   I did speak to cardiology.  Dr. Bettyjo.  No indication for cardioversion given that she has not been on anticoagulation for 3 weeks and currently stable.  Recommended diltiazem drip in the situation.  Reevaluation:   Upon reexamination, patient remains in A-fib with RVR.  Interpreted patient's x-ray.  Shows pleural effusions cardiomegaly as well as pulmonary vascular congestion.  Interestingly, patient's daughter states that she feels like patient's work of breathing became worse after starting the diltiazem today given this, question whether or not of diltiazem is the best choice for rate control in the situation given there could be some underlying heart failure given patient's higher heart rates has been present pretty consistently recently.  reconsulted cardiology.  They recommended started patient on p.o. metoprolol 25 mg every 8 hours to see if this better controls the rate and that way we will be so detrimental in terms of her possible cardiomyopathy.  If further deteriorates, can consider amiodarone.  Recommend inpatient admission and possibly TEE cardioversion on Monday but Monday should be around the 3-week mark in terms of her Eliquis start date  Therefore, did discontinue diltiazem.  Potassium 3.7.  Will give 30 milliequivalent p.o.  Magnesium  appropriate.  Patient HR has improved with the po Metoprolol    Slight troponin delta change. No currently chest pain. Likely demand/rate. No concerns for acute  acs. Can be followed.     Interventions: Diltiazem, metoprolol   EKG Interpreted by Me: a fib rvr    Cardiac Tele Interpreted by Me: a fib rvr    I have independently interpreted the CXR  Social Determinant of Health: Past history of smoking    Disposition and Follow Up: admit    CRITICAL CARE Performed by: Lavonia LOISE Pat   Total critical care time: 45 minutes  Critical care time was exclusive of separately billable procedures and treating other patients.  Critical care was necessary to treat or prevent imminent or life-threatening deterioration.  Critical care was time spent personally by me on the following activities: development of treatment plan with patient and/or surrogate as well as nursing, discussions with consultants, evaluation of patient's response to treatment, examination of patient, obtaining history from patient or surrogate, ordering and performing treatments and interventions, ordering and review of laboratory studies, ordering and review of radiographic studies, pulse oximetry and re-evaluation of patient's condition.      Final diagnoses:  Atrial fibrillation with RVR (HCC)  Acute pulmonary edema [facility])    ED Discharge Orders     None          Pat Lavonia LOISE, MD 09/27/24 2308

## 2024-09-27 NOTE — ED Notes (Signed)
 CCMD called for cardiac monitoring.

## 2024-09-27 NOTE — Progress Notes (Signed)
 Pt called for pre procedure instructions. Arrival time 1115 NPO after midnight explained Instructed to take am meds with sip of water and confirmed blood thinner consistency Instructed pt need for ride home tomorrow and have responsible adult with them for 24 hrs post procedure.

## 2024-09-27 NOTE — ED Triage Notes (Signed)
 Pt BIB [facility] from home with c/o sudden onset SOB. Pt has appt on 11/17 for an ablation. Pt stated she started Cardizem at home today around noon.  20 mg Cardizem and 500 mL fluid bolus given en route with improvement 180/100 130-155 HR 98% 2L [place]

## 2024-09-28 ENCOUNTER — Other Ambulatory Visit: Payer: Self-pay

## 2024-09-28 ENCOUNTER — Encounter (HOSPITAL_COMMUNITY): Payer: Self-pay | Admitting: Internal Medicine

## 2024-09-28 DIAGNOSIS — I5033 Acute on chronic diastolic (congestive) heart failure: Secondary | ICD-10-CM | POA: Diagnosis present

## 2024-09-28 DIAGNOSIS — F32A Depression, unspecified: Secondary | ICD-10-CM | POA: Diagnosis present

## 2024-09-28 DIAGNOSIS — I4819 Other persistent atrial fibrillation: Secondary | ICD-10-CM | POA: Diagnosis not present

## 2024-09-28 DIAGNOSIS — I509 Heart failure, unspecified: Secondary | ICD-10-CM

## 2024-09-28 DIAGNOSIS — R7989 Other specified abnormal findings of blood chemistry: Secondary | ICD-10-CM | POA: Diagnosis present

## 2024-09-28 DIAGNOSIS — J9601 Acute respiratory failure with hypoxia: Secondary | ICD-10-CM | POA: Diagnosis present

## 2024-09-28 LAB — TROPONIN I (HIGH SENSITIVITY)
Troponin I (High Sensitivity): 53 ng/L — ABNORMAL HIGH (ref <18)
Troponin I (High Sensitivity): 53 ng/L — ABNORMAL HIGH (ref <18)

## 2024-09-28 LAB — BASIC METABOLIC PANEL WITH GFR
Anion gap: 12 (ref 5–15)
Anion gap: 15 (ref 5–15)
BUN: 16 mg/dL (ref 8–23)
BUN: 15 mg/dL (ref 8–23)
CO2: 25 mmol/L (ref 22–32)
CO2: 22 mmol/L (ref 22–32)
Calcium: 8.3 mg/dL — ABNORMAL LOW (ref 8.9–10.3)
Calcium: 8.5 mg/dL — ABNORMAL LOW (ref 8.9–10.3)
Chloride: 97 mmol/L — ABNORMAL LOW (ref 98–111)
Chloride: 95 mmol/L — ABNORMAL LOW (ref 98–111)
Creatinine, Ser: 1.01 mg/dL — ABNORMAL HIGH (ref 0.44–1.00)
Creatinine, Ser: 0.92 mg/dL (ref 0.44–1.00)
GFR, Estimated: 59 mL/min — ABNORMAL LOW (ref >60)
GFR, Estimated: >60 mL/min (ref >60)
Glucose, Bld: 120 mg/dL — ABNORMAL HIGH (ref 70–99)
Glucose, Bld: 118 mg/dL — ABNORMAL HIGH (ref 70–99)
Potassium: 5.8 mmol/L — ABNORMAL HIGH (ref 3.5–5.1)
Potassium: 5.1 mmol/L (ref 3.5–5.1)
Sodium: 134 mmol/L — ABNORMAL LOW (ref 135–145)
Sodium: 132 mmol/L — ABNORMAL LOW (ref 135–145)

## 2024-09-28 LAB — COMPREHENSIVE METABOLIC PANEL WITH GFR
ALT: 32 U/L (ref 0–44)
AST: 47 U/L — ABNORMAL HIGH (ref 15–41)
Albumin: 3.2 g/dL — ABNORMAL LOW (ref 3.5–5.0)
Alkaline Phosphatase: 79 U/L (ref 38–126)
Anion gap: 11 (ref 5–15)
BUN: 13 mg/dL (ref 8–23)
CO2: 24 mmol/L (ref 22–32)
Calcium: 7.3 mg/dL — ABNORMAL LOW (ref 8.9–10.3)
Chloride: 101 mmol/L (ref 98–111)
Creatinine, Ser: 0.72 mg/dL (ref 0.44–1.00)
GFR, Estimated: >60 mL/min (ref >60)
Glucose, Bld: 107 mg/dL — ABNORMAL HIGH (ref 70–99)
Potassium: 3 mmol/L — ABNORMAL LOW (ref 3.5–5.1)
Sodium: 136 mmol/L (ref 135–145)
Total Bilirubin: 1.9 mg/dL — ABNORMAL HIGH (ref 0.0–1.2)
Total Protein: 5.6 g/dL — ABNORMAL LOW (ref 6.5–8.1)

## 2024-09-28 LAB — MAGNESIUM
Magnesium: 1.7 mg/dL (ref 1.7–2.4)
Magnesium: 2.8 mg/dL — ABNORMAL HIGH (ref 1.7–2.4)

## 2024-09-28 LAB — URINALYSIS, COMPLETE (UACMP) WITH MICROSCOPIC
Bacteria, UA: NONE SEEN
Bilirubin Urine: NEGATIVE
Glucose, UA: NEGATIVE mg/dL
Ketones, ur: NEGATIVE mg/dL
Leukocytes,Ua: NEGATIVE
Nitrite: NEGATIVE
Protein, ur: NEGATIVE mg/dL
Specific Gravity, Urine: 1.005 (ref 1.005–1.030)
pH: 6 (ref 5.0–8.0)

## 2024-09-28 LAB — CBC WITH DIFFERENTIAL/PLATELET
Abs Immature Granulocytes: 0.02 K/uL (ref 0.00–0.07)
Basophils Absolute: 0 K/uL (ref 0.0–0.1)
Basophils Relative: 1 %
Eosinophils Absolute: 0 K/uL (ref 0.0–0.5)
Eosinophils Relative: 0 %
HCT: 36 % (ref 36.0–46.0)
Hemoglobin: 12 g/dL (ref 12.0–15.0)
Immature Granulocytes: 0 %
Lymphocytes Relative: 6 %
Lymphs Abs: 0.3 K/uL — ABNORMAL LOW (ref 0.7–4.0)
MCH: 29.4 pg (ref 26.0–34.0)
MCHC: 33.3 g/dL (ref 30.0–36.0)
MCV: 88.2 fL (ref 80.0–100.0)
Monocytes Absolute: 0.6 K/uL (ref 0.1–1.0)
Monocytes Relative: 11 %
Neutro Abs: 4.7 K/uL (ref 1.7–7.7)
Neutrophils Relative %: 82 %
Platelets: 227 K/uL (ref 150–400)
RBC: 4.08 MIL/uL (ref 3.87–5.11)
RDW: 14.1 % (ref 11.5–15.5)
WBC: 5.7 K/uL (ref 4.0–10.5)
nRBC: 0 % (ref 0.0–0.2)

## 2024-09-28 LAB — PROCALCITONIN: Procalcitonin: <0.1 ng/mL

## 2024-09-28 LAB — PHOSPHORUS: Phosphorus: 2.4 mg/dL — ABNORMAL LOW (ref 2.5–4.6)

## 2024-09-28 LAB — RESP PANEL BY RT-PCR (RSV, FLU A&B, COVID)  RVPGX2
Influenza A by PCR: POSITIVE — AB
Influenza B by PCR: NEGATIVE
Resp Syncytial Virus by PCR: NEGATIVE
SARS Coronavirus 2 by RT PCR: NEGATIVE

## 2024-09-28 LAB — BRAIN NATRIURETIC PEPTIDE: B Natriuretic Peptide: 780.2 pg/mL — ABNORMAL HIGH (ref 0.0–100.0)

## 2024-09-28 MED ORDER — METOPROLOL TARTRATE 5 MG/5ML IV SOLN
5.0000 mg | Freq: Once | INTRAVENOUS | Status: AC
  Administered 2024-09-28: 5 mg via INTRAVENOUS
  Filled 2024-09-28: qty 5

## 2024-09-28 MED ORDER — MAGNESIUM SULFATE 2 GM/50ML IV SOLN
2.0000 g | Freq: Once | INTRAVENOUS | Status: AC
  Administered 2024-09-28: 2 g via INTRAVENOUS
  Filled 2024-09-28: qty 50

## 2024-09-28 MED ORDER — POTASSIUM CHLORIDE 20 MEQ PO PACK
60.0000 meq | PACK | ORAL | Status: AC
  Administered 2024-09-28 (×2): 60 meq via ORAL
  Filled 2024-09-28 (×2): qty 3

## 2024-09-28 MED ORDER — POTASSIUM CHLORIDE CRYS ER 20 MEQ PO TBCR
40.0000 meq | EXTENDED_RELEASE_TABLET | ORAL | Status: DC
  Administered 2024-09-28: 40 meq via ORAL
  Filled 2024-09-28: qty 2

## 2024-09-28 MED ORDER — ARIPIPRAZOLE 2 MG PO TABS
2.0000 mg | ORAL_TABLET | Freq: Every day | ORAL | Status: DC
  Administered 2024-09-28 – 2024-10-04 (×6): 2 mg via ORAL
  Filled 2024-09-28 (×8): qty 1

## 2024-09-28 MED ORDER — METOPROLOL TARTRATE 25 MG PO TABS
25.0000 mg | ORAL_TABLET | Freq: Once | ORAL | Status: AC
  Administered 2024-09-28: 25 mg via ORAL
  Filled 2024-09-28: qty 1

## 2024-09-28 MED ORDER — FUROSEMIDE 10 MG/ML IJ SOLN
40.0000 mg | Freq: Two times a day (BID) | INTRAMUSCULAR | Status: DC
  Administered 2024-09-28 – 2024-09-29 (×4): 40 mg via INTRAVENOUS
  Filled 2024-09-28 (×5): qty 4

## 2024-09-28 MED ORDER — METOPROLOL TARTRATE 5 MG/5ML IV SOLN
5.0000 mg | INTRAVENOUS | Status: DC | PRN
  Administered 2024-09-29: 5 mg via INTRAVENOUS
  Filled 2024-09-28 (×2): qty 5

## 2024-09-28 MED ORDER — FUROSEMIDE 10 MG/ML IJ SOLN
40.0000 mg | Freq: Once | INTRAMUSCULAR | Status: AC
  Administered 2024-09-28: 40 mg via INTRAVENOUS
  Filled 2024-09-28: qty 4

## 2024-09-28 MED ORDER — METOPROLOL TARTRATE 50 MG PO TABS
50.0000 mg | ORAL_TABLET | Freq: Three times a day (TID) | ORAL | Status: DC
  Administered 2024-09-28 – 2024-09-30 (×7): 50 mg via ORAL
  Filled 2024-09-28 (×4): qty 1
  Filled 2024-09-28: qty 2
  Filled 2024-09-28 (×2): qty 1
  Filled 2024-09-28: qty 2

## 2024-09-28 MED ORDER — IRBESARTAN 150 MG PO TABS
150.0000 mg | ORAL_TABLET | Freq: Every day | ORAL | Status: DC
  Administered 2024-09-28 – 2024-09-29 (×2): 150 mg via ORAL
  Filled 2024-09-28 (×2): qty 1

## 2024-09-28 NOTE — ED Notes (Signed)
 Provider notified of HR and rhythm. Afib RVR at 130-135.

## 2024-09-28 NOTE — Consult Note (Addendum)
 Cardiology Consultation   Patient ID: Destiny Elliott MRN: [id number]; DOB: 1951-02-06  Admit date: 09/27/2024 Date of Consult: 09/28/2024  PCP:  Verena Mems, MD   [facility] Providers Cardiologist:  Lonni LITTIE Nanas, MD  Electrophysiologist:  Eulas FORBES Furbish, MD       Patient Profile: Destiny Elliott is a 73 y.o. female with a history of paroxysmal atrial fibrillation/ flutter on Eliquis, external SVT, hypertension, hyperlipidemia, and bipolar disorder who is being seen 09/28/2024 for the evaluation of atrial flutter with RVR at the request of Dr. Patria.  History of Present Illness: Destiny Elliott is a 73 year old female with the above history.  Monitor in 12/2023 showed 1925 brief episodes of SVT with the longest run lasting 13 seconds as well as frequent PACs (11.7%) and PVCs (7.2%).  Echo in 01/2024 showed LVEF of 60-65% with grade 2 diastolic dysfunction, normal RV size and function with moderately elevated PSAP, severe left atrial enlargement,  and mild MR.  Patient was recently diagnosed with atrial fibrillation/flutter at office visit with Dr. Furbish on 09/06/2024.  His atenolol  was increased and he was started on Eliquis with plans to do a cardioversion after 3 weeks of uninterrupted anticoagulation.  She was seen in the A-Fib clinic on 09/26/2024 at which time she was in atrial flutter with rates in the 130s.  She denied any cardiac symptoms.  Outpatient DCCV was arranged for 09/30/2024.  She was seen in the PharmD Hypertension clinic on 09/27/2024 at which time she was started on Diltiazem and Olmesartan was increased BP control.  Patient presented to the ED on the evening of 09/27/2024 for shortness of breath.  EKG showed atrial fibrillation/ flutter, rate 127 bpm, with no acute ischemic changes.  High sensitivity troponin minimally elevated and flat at 27 >> 42 >> 53 >> 53.  BNP elevated at 780.2.  Chest x-ray showed cardiomegaly with diffuse interstitial  prominence and curly B-lines with trace bilateral pleural effusions suggestive of CHF. WBC 7.9, Hgb 12.7, Plts 255. Na 138, K 3.7, Glucose 116, BUN 15, Cr 0.92. Mag 1.7. TSH normal. Procalcitonin normal. Urinalysis showed small hemoglobin but was not suggestive of UTI.  Patient states she had sudden onset of shortness of breath while laying down last night which is why she decided to come to the ED. She states this occurred after he took her first dose of diltiazem.  She otherwise has really been asymptomatic with her atrial fibrillation/flutter.  She is active at baseline and has not had any dyspnea on exertion or chest pain.  No other shortness of breath other than last night.  She has had some new lower extremity edema over the last week.  No lightheadedness, dizziness, syncope.  She has had a mild on productive cough over the last few days but states this was after being exposed to a lot of people coughing when she went to get outpatient labs.  She had a temp of 100.1 earlier this morning in the ED but denies being febrile at home.  No nausea, vomiting, or diarrhea.  No abnormal bleeding in urine or stools.  Her breathing is better after receiving IV Lasix in the ED but she remains tachycardic.   Past Medical History:  Diagnosis Date   Abnormal Pap smear 1994   --CIN I   Allergic rhinitis    Anxiety    Dense breasts    Endocervical polyp 01/1993   -benign   Hypercholesterolemia    Hypertension    Osteopenia  Paroxysmal atrial fibrillation [facility])     Past Surgical History:  Procedure Laterality Date   CERVICAL BIOPSY  W/ LOOP ELECTRODE EXCISION  03/1993   CIN I   COLONOSCOPY  2016   endocervical polyp  01-27-93   -benign   TOOTH EXTRACTION  08/2017       Scheduled Meds:  apixaban  5 mg Oral BID   ARIPiprazole   2 mg Oral Daily   furosemide  40 mg Intravenous BID   irbesartan  150 mg Oral Daily   metoprolol tartrate  50 mg Oral Q8H   potassium chloride   60 mEq Oral Q4H    Continuous Infusions:   PRN Meds: acetaminophen  **OR** acetaminophen , melatonin, metoprolol tartrate, ondansetron  (ZOFRAN ) IV  Allergies:    Allergies  Allergen Reactions   Ambien [Zolpidem Tartrate] Other (See Comments)    Causes depression   Gabapentin Other (See Comments)    Hypertension   Nickel Hives and Itching   Nortriptyline Other (See Comments)    Causes dry sinuses,dry mouth   Paxil [Paroxetine Hcl] Other (See Comments)    Agitation   Remeron [Mirtazapine] Other (See Comments)    Agitation and depression   Trazodone  Hcl Other (See Comments)    Agitation   Zoloft [Sertraline Hcl] Other (See Comments)    Agitation   Zolpidem Other (See Comments)    Other Reaction(s): personality change   Amlodipine Besylate Other (See Comments)    Patient cannot recall.    Benadryl  [Diphenhydramine  Hcl] Other (See Comments)    Causes nasal dryness   Buspar [Buspirone] Other (See Comments)    Abnormal sensory issues   Dextromethorphan-Guaifenesin Other (See Comments)    Made patient mucus passages extremely dry   Flonase [Fluticasone Propionate] Other (See Comments)    Reaction type/severity unknown    Nirmatrelvir-Ritonavir Diarrhea    Other Reaction(s): diarrhea   Tylenol  Pm Extra [Diphenhydramine -Apap (Sleep)] Other (See Comments)    Causes nasal dryness   Vitamin D  Analogs Other (See Comments)    doesn't take Vit D supplements because they make her feel bad     Social History:   Social History   Socioeconomic History   Marital status: Married    Spouse name: Todd   Number of children: 2   Years of education: 12   Highest education level: Not on file  Occupational History   Occupation: [employer]    Comment: self employed  Tobacco Use   Smoking status: Former    Current packs/day: 0.00    Average packs/day: 0.5 packs/day for 28.0 years (14.0 ttl pk-yrs)    Types: Cigarettes    Start date: 07/01/1976    Quit date: 07/01/2004    Years since quitting:  20.2   Smokeless tobacco: Never  Vaping Use   Vaping status: Never Used  Substance and Sexual Activity   Alcohol use: Yes    Comment: occ wine   Drug use: No   Sexual activity: Yes    Partners: Male    Birth control/protection: Post-menopausal    Comment: married  Other Topics Concern   Not on file  Social History Narrative   Lives with Krystal, one child deceased   Caffeine- 2 cups of tea   Social Drivers of Corporate Investment Banker Strain: Not on file  Food Insecurity: Not on file  Transportation Needs: Not on file  Physical Activity: Not on file  Stress: Not on file  Social Connections: Not on file  Intimate Partner Violence: Not on  file    Family History:   Family History  Problem Relation Age of Onset   Stroke Father 65   Kidney disease Father    Hypertension Father    Hypertension Sister 64   Hyperlipidemia Sister    COPD Sister    Breast cancer Sister 39   Thyroid  disease Mother 28   Heart failure Mother    Heart disease Mother    Hypertension Mother    COPD Mother    Heart disease Brother 73       AFIB   Suicidality Son 22       07/20/2013   Thyroid  disease Sister 35     ROS:  Please see the history of present illness.   Physical Exam/Data: Vitals:   09/28/24 0830 09/28/24 0915 09/28/24 1010 09/28/24 1038  BP: (!) 161/108 (!) 144/93 116/84   Pulse: (!) 124 (!) 125 88   Resp: 20 (!) 22 (!) 25   Temp:    98.8 F (37.1 C)  TempSrc:    Oral  SpO2: 99% 97% 94%   Weight:      Height:        Intake/Output Summary (Last 24 hours) at 09/28/2024 1119 Last data filed at 09/28/2024 9060 Gross per 24 hour  Intake 175.87 ml  Output 800 ml  Net -624.13 ml      09/27/2024    7:18 PM 09/26/2024    9:22 AM 09/06/2024    1:56 PM  Last 3 Weights  Weight (lbs) 131 lb 6.3 oz 131 lb 6.4 oz 134 lb  Weight (kg) 59.6 kg 59.603 kg 60.782 kg     Body mass index is 23.28 kg/m.  General: 73 y.o. Caucasian female resting comfortably in no acute  distress. HEENT: Normocephalic and atraumatic. Sclera clear.  Neck: Supple. JVD looks mildly elevated. Heart: Tachycardic with irregularly irregular rhythm.  No murmurs, gallops, or rubs.  Lungs: No increased work of breathing. Very mild crackle noted in bilateral bases (right > left).  Abdomen: Soft, non-distended, and non-tender to palpation.  Extremities: Mild non-pitting edema of bilateral extremities.  Skin: Warm and dry. Neuro: Alert and oriented x3. No focal deficits. Psych: Normal affect. Responds appropriately.   EKG:  The EKGs were personally reviewed.  Initial EKG on 09/27/2024 showed atrial fibrillation flutter, rate 127 bpm, with no acute ischemic changes.  Repeat EKG on 09/28/2024 showed a flutter, rate 124 bpm, with LVH, and mild ST depression in leads V5-V6. Telemetry:  Telemetry was personally reviewed and demonstrates: Atrial fibrillation/flutter with rates ranging from the  low 100s to 120s-130s  Relevant CV Studies:  Echocardiogram 02/01/2024: Impressions: 1. Left ventricular ejection fraction, by estimation, is 60 to 65%. The  left ventricle has normal function. The left ventricle has no regional  wall motion abnormalities. Left ventricular diastolic parameters are  consistent with Grade II diastolic  dysfunction (pseudonormalization). Elevated left ventricular end-diastolic  pressure.   2. Right ventricular systolic function is normal. The right ventricular  size is normal. There is moderately elevated pulmonary artery systolic  pressure.   3. Left atrial size was severely dilated.   4. The mitral valve is normal in structure. Mild mitral valve  regurgitation. No evidence of mitral stenosis. There is mild prolapse of  of the mitral valve.   5. The aortic valve is tricuspid. Aortic valve regurgitation is not  visualized. No aortic stenosis is present.   6. The inferior vena cava is normal in size with greater than 50%  respiratory variability, suggesting right  atrial pressure of 3 mmHg.    Laboratory Data: High Sensitivity Troponin:   Recent Labs  Lab 09/27/24 1923 09/27/24 2139 09/28/24 0053 09/28/24 0445  TROPONINIHS 27* 42* 53* 53*     Chemistry Recent Labs  Lab 09/24/24 1329 09/27/24 1923 09/28/24 0445  NA 144 138 136  K 4.2 3.7 3.0*  CL 103 102 101  CO2 25 23 24   GLUCOSE 135* 116* 107*  BUN 16 15 13   CREATININE 1.16* 0.92 0.72  CALCIUM 8.8 8.5* 7.3*  MG  --  2.1 1.7  GFRNONAA  --  >60 >60  ANIONGAP  --  13 11    Recent Labs  Lab 09/28/24 0445  PROT 5.6*  ALBUMIN 3.2*  AST 47*  ALT 32  ALKPHOS 79  BILITOT 1.9*   Lipids No results for input(s): CHOL, TRIG, HDL, LABVLDL, LDLCALC, CHOLHDL in the last 168 hours.  Hematology Recent Labs  Lab 09/26/24 1117 09/27/24 1923 09/28/24 0445  WBC 4.9 7.9 5.7  RBC 4.20 4.32 4.08  HGB 12.4 12.7 12.0  HCT 38.6 39.1 36.0  MCV 92 90.5 88.2  MCH 29.5 29.4 29.4  MCHC 32.1 32.5 33.3  RDW 13.4 14.1 14.1  PLT 259 255 227   Thyroid   Recent Labs  Lab 09/27/24 1923  TSH 1.129    BNP Recent Labs  Lab 09/28/24 0606  BNP 780.2*    DDimer No results for input(s): DDIMER in the last 168 hours.  Radiology/Studies:  DG Chest 2 View Result Date: 09/27/2024 EXAM: 2 VIEW(S) XRAY OF THE CHEST 09/27/2024 07:46:00 PM COMPARISON: CXR 03/07/2023 CLINICAL HISTORY: chest pain FINDINGS: LUNGS AND PLEURA: Diffuse interstitial prominence and Kerley-B lines. Trace bilateral pleural effusions. No pneumothorax. HEART AND MEDIASTINUM: Cardiomegaly. Aortic calcification. BONES AND SOFT TISSUES: No acute osseous abnormality. IMPRESSION: 1. Pulmonary edema with  trace bilateral pleural effusions. 2. Cardiomegaly. Electronically signed by: Morgane Naveau MD 09/27/2024 07:52 PM EST RP Workstation: [id number]     Assessment and Plan:  Persistent Atrial Fibrillation/Flutter Patient was recently diagnosed with atrial fibrillation/flutter with RVR on 09/02/2024 and plan was for  outpatient DCCV after 3 weeks of uninterrupted anticoagulation.  Echo in 01/2024 showed LVEF of 60-65%, normal RV function, and severe left atrial enlargement.  She now presents with acute onset of shortness of breath. She remains in rapid atrial fibrillation/ flutter with rates in the 120s to 130s. - Rates ranging from the low 100s to 120s-130s. - Potassium 3.0 and Magnesium  1.7 today. Being repleted.  - TSH normal. -She is on Atenolol  100 mg daily at home and was recently prescribed Diltiazem 120 mg on 09/27/2024.  However patient develops sudden onset of shortness of breath after taking first dose of Diltiazem. - She is currently on Lopressor 50mg  every 8 hours rates remain elevated. - Continue Eliquis 5mg  twice daily. - Initial plan was  for outpatient DCCV on 09/30/2024.  She denies missing any doses of Eliquis (although last night's dose was given around midnight she normally takes this at 7 PM).  Repeat echo is pending but suspect her EF may be down.  I think she would benefit with IV amiodarone.  Her last echo in 3/25 showed her left atrium was severely dilated so suspect she may need an antiarrhythmic to help hold sinus rhythm anyway.  Will discuss IV Amiodarone with MD and then continue with plans for DCCV on 09/30/2024.  Acute CHF Patient presented with sudden onset of CHF.  BNP elevated at 780.2.  Chest x-ray showed cardiomegaly with diffuse interstitial prominence and curly B-lines with trace bilateral pleural effusions suggestive of CHF. She was started on IV Lasix with improvement in symptoms.  Last Echo in 01/2024 showed normal LV function. - She still looks mildly volume overloaded. -Repeat echo has been ordered and is pending.  Suspect EF may be down given she has been in rapid atrial fibrillation for almost 3 weeks. - Continue IV Lasix 40 mg twice daily today and then can reassess tomorrow. - Continue Irbesartan 150 mg daily. - Continue Lopressor 50mg  every 8 hours for now. Can  consolidate prior to discharge.  - If EF is low, will need to optimize GDMT further. - Suspect current exacerbation is due to tachyarrhythmia.  Demand Ischemia High-sensitivity troponin minimally elevated and flat at 27 >> 42 >> 53 >> 53.  Pete EKG this morning showed some mild ST depressions in lateral leads but suspect this is rate related. - She denies any chest pain. - Elevation is not consistent with ACS and is consistent with demand ischemia from rapid atrial fibrillation, acute CHF, severe hypertension.  Hypertension BP deeply elevated on admission and as high as initially as high as 195/123.  However, as since improved.  Most recent BP 116/84. - Continue Irbesartan and Lopressor as above.  Hypokalemia Potassium 3.7 on admission but down to 3.0 after IV Lasix.  - Being repleted by primary team.   Risk Assessment/Risk Scores:       [place]  Heart Association (NYHA) Functional Class She presented in NYHA Class IV with sudden shortness of breath at rest  but asymptomatic before then.  CHA2DS2-VASc Score = 4  This indicates a 4.8% annual risk of stroke. The patient's score is based upon: CHF History: 1 HTN History: 1 Diabetes History: 0 Stroke History: 0 Vascular Disease History: 0 Age Score: 1 Gender Score: 1     For questions or updates, please contact [facility] Please consult [email or web address] for contact info under      Signed, Deserai Cansler E Ivor Kishi, PA-C  09/28/2024 11:19 AM

## 2024-09-28 NOTE — H&P (View-Only) (Signed)
 Cardiology Consultation   Patient ID: Destiny Elliott MRN: [id number]; DOB: 1951-02-06  Admit date: 09/27/2024 Date of Consult: 09/28/2024  PCP:  Destiny Mems, MD   [facility] Providers Cardiologist:  Destiny LITTIE Nanas, MD  Electrophysiologist:  Destiny FORBES Furbish, MD       Patient Profile: Destiny Elliott is a 73 y.o. female with a history of paroxysmal atrial fibrillation/ flutter on Eliquis, external SVT, hypertension, hyperlipidemia, and bipolar disorder who is being seen 09/28/2024 for the evaluation of atrial flutter with RVR at the request of Dr. Patria.  History of Present Illness: [name] is a 73 year old female with the above history.  Monitor in 12/2023 showed 1925 brief episodes of SVT with the longest run lasting 13 seconds as well as frequent PACs (11.7%) and PVCs (7.2%).  Echo in 01/2024 showed LVEF of 60-65% with grade 2 diastolic dysfunction, normal RV size and function with moderately elevated PSAP, severe left atrial enlargement,  and mild MR.  Patient was recently diagnosed with atrial fibrillation/flutter at office visit with Dr. Furbish on 09/06/2024.  His atenolol  was increased and he was started on Eliquis with plans to do a cardioversion after 3 weeks of uninterrupted anticoagulation.  She was seen in the [facility] on 09/26/2024 at which time she was in atrial flutter with rates in the 130s.  She denied any cardiac symptoms.  Outpatient DCCV was arranged for 09/30/2024.  She was seen in the Destiny Elliott on 09/27/2024 at which time she was started on Diltiazem and Olmesartan was increased BP control.  Patient presented to the ED on the evening of 09/27/2024 for shortness of breath.  EKG showed atrial fibrillation/ flutter, rate 127 bpm, with no acute ischemic changes.  High sensitivity troponin minimally elevated and flat at 27 >> 42 >> 53 >> 53.  BNP elevated at 780.2.  Chest x-ray showed cardiomegaly with diffuse interstitial  prominence and curly B-lines with trace bilateral pleural effusions suggestive of CHF. WBC 7.9, Hgb 12.7, Plts 255. Na 138, K 3.7, Glucose 116, BUN 15, Cr 0.92. Mag 1.7. TSH normal. Procalcitonin normal. Urinalysis showed small hemoglobin but was not suggestive of UTI.  Patient states she had sudden onset of shortness of breath while laying down last night which is why she decided to come to the ED. She states this occurred after he took her first dose of diltiazem.  She otherwise has really been asymptomatic with her atrial fibrillation/flutter.  She is active at baseline and has not had any dyspnea on exertion or chest pain.  No other shortness of breath other than last night.  She has had some new lower extremity edema over the last week.  No lightheadedness, dizziness, syncope.  She has had a mild on productive cough over the last few days but states this was after being exposed to a lot of people coughing when she went to get outpatient labs.  She had a temp of 100.1 earlier this morning in the ED but denies being febrile at home.  No nausea, vomiting, or diarrhea.  No abnormal bleeding in urine or stools.  Her breathing is better after receiving IV Lasix in the ED but she remains tachycardic.   Past Medical History:  Diagnosis Date   Abnormal Pap smear 1994   --CIN I   Allergic rhinitis    Anxiety    Dense breasts    Endocervical polyp 01/1993   -benign   Hypercholesterolemia    Hypertension    Osteopenia  Paroxysmal atrial fibrillation [facility])     Past Surgical History:  Procedure Laterality Date   CERVICAL BIOPSY  W/ LOOP ELECTRODE EXCISION  03/1993   CIN I   COLONOSCOPY  2016   endocervical polyp  01-27-93   -benign   TOOTH EXTRACTION  08/2017       Scheduled Meds:  apixaban  5 mg Oral BID   ARIPiprazole   2 mg Oral Daily   furosemide  40 mg Intravenous BID   irbesartan  150 mg Oral Daily   metoprolol tartrate  50 mg Oral Q8H   potassium chloride   60 mEq Oral Q4H    Continuous Infusions:   PRN Meds: acetaminophen  **OR** acetaminophen , melatonin, metoprolol tartrate, ondansetron  (ZOFRAN ) IV  Allergies:    Allergies  Allergen Reactions   Ambien [Zolpidem Tartrate] Other (See Comments)    Causes depression   Gabapentin Other (See Comments)    Hypertension   Nickel Hives and Itching   Nortriptyline Other (See Comments)    Causes dry sinuses,dry mouth   Paxil [Paroxetine Hcl] Other (See Comments)    Agitation   Remeron [Mirtazapine] Other (See Comments)    Agitation and depression   Trazodone  Hcl Other (See Comments)    Agitation   Zoloft [Sertraline Hcl] Other (See Comments)    Agitation   Zolpidem Other (See Comments)    Other Reaction(s): personality change   Amlodipine Besylate Other (See Comments)    Patient cannot recall.    Benadryl  [Diphenhydramine  Hcl] Other (See Comments)    Causes nasal dryness   Buspar [Buspirone] Other (See Comments)    Abnormal sensory issues   Dextromethorphan-Guaifenesin Other (See Comments)    Made patient mucus passages extremely dry   Flonase [Fluticasone Propionate] Other (See Comments)    Reaction type/severity unknown    Nirmatrelvir-Ritonavir Diarrhea    Other Reaction(s): diarrhea   Tylenol  Pm Extra [Diphenhydramine -Apap (Sleep)] Other (See Comments)    Causes nasal dryness   Vitamin D  Analogs Other (See Comments)    doesn't take Vit D supplements because they make her feel bad     Social History:   Social History   Socioeconomic History   Marital status: Married    Spouse name: Destiny Elliott   Number of children: 2   Years of education: 12   Highest education level: Not on file  Occupational History   Occupation: [employer]    Comment: self employed  Tobacco Use   Smoking status: Former    Current packs/day: 0.00    Average packs/day: 0.5 packs/day for 28.0 years (14.0 ttl pk-yrs)    Types: Cigarettes    Start date: 07/01/1976    Quit date: 07/01/2004    Years since quitting:  20.2   Smokeless tobacco: Never  Vaping Use   Vaping status: Never Used  Substance and Sexual Activity   Alcohol use: Yes    Comment: occ wine   Drug use: No   Sexual activity: Yes    Partners: Male    Birth control/protection: Post-menopausal    Comment: married  Other Topics Concern   Not on file  Social History Narrative   Lives with Krystal, one child deceased   Caffeine- 2 cups of tea   Social Drivers of Corporate Investment Banker Strain: Not on file  Food Insecurity: Not on file  Transportation Needs: Not on file  Physical Activity: Not on file  Stress: Not on file  Social Connections: Not on file  Intimate Partner Violence: Not on  file    Family History:   Family History  Problem Relation Age of Onset   Stroke Father 65   Kidney disease Father    Hypertension Father    Hypertension Sister 64   Hyperlipidemia Sister    COPD Sister    Breast cancer Sister 39   Thyroid  disease Mother 28   Heart failure Mother    Heart disease Mother    Hypertension Mother    COPD Mother    Heart disease Brother 73       AFIB   Suicidality Son 22       07/20/2013   Thyroid  disease Sister 35     ROS:  Please see the history of present illness.   Physical Exam/Data: Vitals:   09/28/24 0830 09/28/24 0915 09/28/24 1010 09/28/24 1038  BP: (!) 161/108 (!) 144/93 116/84   Pulse: (!) 124 (!) 125 88   Resp: 20 (!) 22 (!) 25   Temp:    98.8 F (37.1 C)  TempSrc:    Oral  SpO2: 99% 97% 94%   Weight:      Height:        Intake/Output Summary (Last 24 hours) at 09/28/2024 1119 Last data filed at 09/28/2024 9060 Gross per 24 hour  Intake 175.87 ml  Output 800 ml  Net -624.13 ml      09/27/2024    7:18 PM 09/26/2024    9:22 AM 09/06/2024    1:56 PM  Last 3 Weights  Weight (lbs) 131 lb 6.3 oz 131 lb 6.4 oz 134 lb  Weight (kg) 59.6 kg 59.603 kg 60.782 kg     Body mass index is 23.28 kg/m.  General: 73 y.o. Caucasian female resting comfortably in no acute  distress. HEENT: Normocephalic and atraumatic. Sclera clear.  Neck: Supple. JVD looks mildly elevated. Heart: Tachycardic with irregularly irregular rhythm.  No murmurs, gallops, or rubs.  Lungs: No increased work of breathing. Very mild crackle noted in bilateral bases (right > left).  Abdomen: Soft, non-distended, and non-tender to palpation.  Extremities: Mild non-pitting edema of bilateral extremities.  Skin: Warm and dry. Neuro: Alert and oriented x3. No focal deficits. Psych: Normal affect. Responds appropriately.   EKG:  The EKGs were personally reviewed.  Initial EKG on 09/27/2024 showed atrial fibrillation flutter, rate 127 bpm, with no acute ischemic changes.  Repeat EKG on 09/28/2024 showed a flutter, rate 124 bpm, with LVH, and mild ST depression in leads V5-V6. Telemetry:  Telemetry was personally reviewed and demonstrates: Atrial fibrillation/flutter with rates ranging from the  low 100s to 120s-130s  Relevant CV Studies:  Echocardiogram 02/01/2024: Impressions: 1. Left ventricular ejection fraction, by estimation, is 60 to 65%. The  left ventricle has normal function. The left ventricle has no regional  wall motion abnormalities. Left ventricular diastolic parameters are  consistent with Grade II diastolic  dysfunction (pseudonormalization). Elevated left ventricular end-diastolic  pressure.   2. Right ventricular systolic function is normal. The right ventricular  size is normal. There is moderately elevated pulmonary artery systolic  pressure.   3. Left atrial size was severely dilated.   4. The mitral valve is normal in structure. Mild mitral valve  regurgitation. No evidence of mitral stenosis. There is mild prolapse of  of the mitral valve.   5. The aortic valve is tricuspid. Aortic valve regurgitation is not  visualized. No aortic stenosis is present.   6. The inferior vena cava is normal in size with greater than 50%  respiratory variability, suggesting right  atrial pressure of 3 mmHg.    Laboratory Data: High Sensitivity Troponin:   Recent Labs  Lab 09/27/24 1923 09/27/24 2139 09/28/24 0053 09/28/24 0445  TROPONINIHS 27* 42* 53* 53*     Chemistry Recent Labs  Lab 09/24/24 1329 09/27/24 1923 09/28/24 0445  NA 144 138 136  K 4.2 3.7 3.0*  CL 103 102 101  CO2 25 23 24   GLUCOSE 135* 116* 107*  BUN 16 15 13   CREATININE 1.16* 0.92 0.72  CALCIUM 8.8 8.5* 7.3*  MG  --  2.1 1.7  GFRNONAA  --  >60 >60  ANIONGAP  --  13 11    Recent Labs  Lab 09/28/24 0445  PROT 5.6*  ALBUMIN 3.2*  AST 47*  ALT 32  ALKPHOS 79  BILITOT 1.9*   Lipids No results for input(s): CHOL, TRIG, HDL, LABVLDL, LDLCALC, CHOLHDL in the last 168 hours.  Hematology Recent Labs  Lab 09/26/24 1117 09/27/24 1923 09/28/24 0445  WBC 4.9 7.9 5.7  RBC 4.20 4.32 4.08  HGB 12.4 12.7 12.0  HCT 38.6 39.1 36.0  MCV 92 90.5 88.2  MCH 29.5 29.4 29.4  MCHC 32.1 32.5 33.3  RDW 13.4 14.1 14.1  PLT 259 255 227   Thyroid   Recent Labs  Lab 09/27/24 1923  TSH 1.129    BNP Recent Labs  Lab 09/28/24 0606  BNP 780.2*    DDimer No results for input(s): DDIMER in the last 168 hours.  Radiology/Studies:  DG Chest 2 View Result Date: 09/27/2024 EXAM: 2 VIEW(S) XRAY OF THE CHEST 09/27/2024 07:46:00 PM COMPARISON: CXR 03/07/2023 CLINICAL HISTORY: chest pain FINDINGS: LUNGS AND PLEURA: Diffuse interstitial prominence and Kerley-B lines. Trace bilateral pleural effusions. No pneumothorax. HEART AND MEDIASTINUM: Cardiomegaly. Aortic calcification. BONES AND SOFT TISSUES: No acute osseous abnormality. IMPRESSION: 1. Pulmonary edema with  trace bilateral pleural effusions. 2. Cardiomegaly. Electronically signed by: Morgane Naveau MD 09/27/2024 07:52 PM EST RP Workstation: [id number]     Assessment and Plan:  Persistent Atrial Fibrillation/Flutter Patient was recently diagnosed with atrial fibrillation/flutter with RVR on 09/02/2024 and plan was for  outpatient DCCV after 3 weeks of uninterrupted anticoagulation.  Echo in 01/2024 showed LVEF of 60-65%, normal RV function, and severe left atrial enlargement.  She now presents with acute onset of shortness of breath. She remains in rapid atrial fibrillation/ flutter with rates in the 120s to 130s. - Rates ranging from the low 100s to 120s-130s. - Potassium 3.0 and Magnesium  1.7 today. Being repleted.  - TSH normal. -She is on Atenolol  100 mg daily at home and was recently prescribed Diltiazem 120 mg on 09/27/2024.  However patient develops sudden onset of shortness of breath after taking first dose of Diltiazem. - She is currently on Lopressor 50mg  every 8 hours rates remain elevated. - Continue Eliquis 5mg  twice daily. - Initial plan was  for outpatient DCCV on 09/30/2024.  She denies missing any doses of Eliquis (although last night's dose was given around midnight she normally takes this at 7 PM).  Repeat echo is pending but suspect her EF may be down.  I think she would benefit with IV amiodarone.  Her last echo in 3/25 showed her left atrium was severely dilated so suspect she may need an antiarrhythmic to help hold sinus rhythm anyway.  Will discuss IV Amiodarone with MD and then continue with plans for DCCV on 09/30/2024.  Acute CHF Patient presented with sudden onset of CHF.  BNP elevated at 780.2.  Chest x-ray showed cardiomegaly with diffuse interstitial prominence and curly B-lines with trace bilateral pleural effusions suggestive of CHF. She was started on IV Lasix with improvement in symptoms.  Last Echo in 01/2024 showed normal LV function. - She still looks mildly volume overloaded. -Repeat echo has been ordered and is pending.  Suspect EF may be down given she has been in rapid atrial fibrillation for almost 3 weeks. - Continue IV Lasix 40 mg twice daily today and then can reassess tomorrow. - Continue Irbesartan 150 mg daily. - Continue Lopressor 50mg  every 8 hours for now. Can  consolidate prior to discharge.  - If EF is low, will need to optimize GDMT further. - Suspect current exacerbation is due to tachyarrhythmia.  Demand Ischemia High-sensitivity troponin minimally elevated and flat at 27 >> 42 >> 53 >> 53.  Pete EKG this morning showed some mild ST depressions in lateral leads but suspect this is rate related. - She denies any chest pain. - Elevation is not consistent with ACS and is consistent with demand ischemia from rapid atrial fibrillation, acute CHF, severe hypertension.  Hypertension BP deeply elevated on admission and as high as initially as high as 195/123.  However, as since improved.  Most recent BP 116/84. - Continue Irbesartan and Lopressor as above.  Hypokalemia Potassium 3.7 on admission but down to 3.0 after IV Lasix.  - Being repleted by primary team.   Risk Assessment/Risk Scores:       New [place]  Heart Association (NYHA) Functional Class She presented in NYHA Class IV with sudden shortness of breath at rest  but asymptomatic before then.  CHA2DS2-VASc Score = 4  This indicates a 4.8% annual risk of stroke. The patient's score is based upon: CHF History: 1 HTN History: 1 Diabetes History: 0 Stroke History: 0 Vascular Disease History: 0 Age Score: 1 Gender Score: 1     For questions or updates, please contact [facility] Please consult [email or web address] for contact info under      Signed, Deserai Cansler E Ivor Kishi, PA-C  09/28/2024 11:19 AM

## 2024-09-28 NOTE — Hospital Course (Addendum)
 73 y.o. female with medical history significant for paroxysmal atrial fibrillation recently started on Eliquis , send hypertension, chronic diastolic heart failure, who is admitted to [facility] on 09/27/2024 with atrial fibrillation with RVR after presenting from home to [facility] ED complaining of shortness of breath.    In the context of recent diagnosis of atrial fibrillation, the patient has been following with cardiology in atrial fibrillation clinic, on anticoag with plans for outpt cardioversion 11/17. Pt developed progressive shortness of breath over the last 2 days, associated with some orthopnea, new onset nonproductive cough   In the ED, pt was noted to have afib in RVR with evidence of vol overload.   Significant events based on progress notes: 11/14: Patient admitted to hospitalist service for atrial fibrillation with RVR.  Cardiology consulted. 11/16: Code stroke was called.  Initial concerns for seizure.  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  Possible infarct in the right frontal lobe.  Neurology consulted. 11/19: TEE ordered and pending.  Cardioversion can be done potentially after results of TEE are negative for thrombus. Neurology signed off.  11/20: Amiodarone  gtt. was discontinued today.  Amiodarone  200 mg p.o. twice daily initiated.  PT, OT consulted.  Tussionex nightly as needed ordered.   11/21: LFTs reviewed.  Patient stable overnight.  Discharge.

## 2024-09-28 NOTE — Progress Notes (Signed)
  Progress Note   Patient: Destiny Elliott FMW:[id number] DOB: 27-Jan-1951 DOA: 09/27/2024     1 DOS: the patient was seen and examined on 09/28/2024   Brief hospital course: 73 y.o. female with medical history significant for paroxysmal atrial fibrillation recently started on Eliquis, send hypertension, chronic diastolic heart failure, who is admitted to [facility] on 09/27/2024 with atrial fibrillation with RVR after presenting from home to [facility] ED complaining of shortness of breath.    In the context of recent diagnosis of atrial fibrillation, the patient has been following with cardiology in atrial fibrillation clinic, on anticoag with plans for outpt cardioversion 11/17. Pt developed progressive shortness of breath over the last 2 days, associated with some orthopnea, new onset nonproductive cough   In the ED, pt was noted to have afib in RVR with evidence of vol overload.   Assessment and Plan: #) Atrial fibrillation with RVR:  -Cardiology consulted -on lopressor 50mg  q8h -cont to correct lytes as needed, goal K >4 and Mg>2 -cont eliquis -consideration for IV amiodarone noted -Currently remains tachycardic -Tentative plans for DCCV 11/17   #) Acute on chronic diastolic heart failure:  -Elevated BNP in excess of 780 with evidence of vol overload -continued on IV lasix bid -good diuresis with clinical improvement thus far -repeat 2d echo pending   #) Essential Hypertension:  -currently on avapro 150mg  and lopressor 50mg  TID    #) Depression:  -continue Abilify    #) Flu A + -Low grade temp this AM -Flu A pos, covid neg, RSV neg -Unclear onset of symptoms -Pt already reported felling improved after diuresis -Will hold off on antiviral and cont supportive care     Subjective: Reports feeling better this AM  Physical Exam: Vitals:   09/28/24 1038 09/28/24 1216 09/28/24 1330 09/28/24 1416  BP:  (!) 127/91 (!) 158/109 (!) 152/113  Pulse:  (!) 132 (!) 126 (!) 127   Resp:  (!) 23 18 (!) 25  Temp: 98.8 F (37.1 C) 99.9 F (37.7 C)  99 F (37.2 C)  TempSrc: Oral Oral  Oral  SpO2:  93% 96% 94%  Weight:    56.5 kg  Height:       General exam: Awake, laying in bed, in nad Respiratory system: Normal respiratory effort, no wheezing Cardiovascular system: regular rate, s1, s2 Gastrointestinal system: Soft, nondistended, positive BS Central nervous system: CN2-12 grossly intact, strength intact Extremities: Perfused, no clubbing Skin: Normal skin turgor, no notable skin lesions seen Psychiatry: Mood normal // no visual hallucinations   Data Reviewed:  Labs reviewed: Na 134, K 5.8, Cr 1.01, Mg 2.8, WBC 5.7, Hgb 12.0, Plts 227  Family Communication: Pt in room, family at bedside  Disposition: Status is: Inpatient Remains inpatient appropriate because: severity of illness  Planned Discharge Destination: Home    Author: Garnette Pelt, MD 09/28/2024 5:00 PM  For on call review [email or web address].

## 2024-09-28 NOTE — ED Notes (Signed)
 Dr. Marcene made aware of pts high HR and BP. New orders placed by provider.

## 2024-09-28 NOTE — Progress Notes (Signed)
 MEWS Progress Note  Patient Details Name: Destiny Elliott MRN: [id number] DOB: 1951/08/22 Today's Date: 09/28/2024   MEWS Flowsheet Documentation:  Assess: MEWS Score Temp: 99 F (37.2 C) BP: (!) 152/113 MAP (mmHg): 124 Pulse Rate: (!) 127 ECG Heart Rate: (!) 127 Resp: (!) 25 Level of Consciousness: Alert SpO2: 94 % O2 Device: Room Air O2 Flow Rate (L/min): 2 L/min Assess: MEWS Score MEWS Temp: 0 MEWS Systolic: 0 MEWS Pulse: 2 MEWS RR: 1 MEWS LOC: 0 MEWS Score: 3 MEWS Score Color: Yellow Assess: SIRS CRITERIA SIRS Temperature : 0 SIRS Respirations : 1 SIRS Pulse: 1 SIRS WBC: 0 SIRS Score Sum : 2 SIRS Temperature : 0 SIRS Pulse: 1 SIRS Respirations : 1 SIRS WBC: 0 SIRS Score Sum : 2 Assess: if the MEWS score is Yellow or Red Were vital signs accurate and taken at a resting state?: Yes Does the patient meet 2 or more of the SIRS criteria?: Yes Does the patient have a confirmed or suspected source of infection?: No MEWS guidelines implemented : Yes, yellow Treat MEWS Interventions: Considered administering scheduled or prn medications/treatments as ordered Take Vital Signs Increase Vital Sign Frequency : Yellow: Q2hr x1, continue Q4hrs until patient remains green for 12hrs Escalate MEWS: Escalate: Yellow: Discuss with charge nurse and consider notifying provider and/or RRT Notify: Charge Nurse/RN Name of Charge Nurse/RN Notified: Corean Sous RN      Sous Corean B 09/28/2024, 2:27 PM

## 2024-09-28 NOTE — ED Notes (Signed)
 Pt had incontinent episode in bed. Pt provided with peri care and full linen change.

## 2024-09-28 NOTE — ED Notes (Signed)
 Dr. Marcene updated on pts high BP and HR. New orders placed.

## 2024-09-29 ENCOUNTER — Inpatient Hospital Stay (HOSPITAL_COMMUNITY)

## 2024-09-29 DIAGNOSIS — I6523 Occlusion and stenosis of bilateral carotid arteries: Secondary | ICD-10-CM | POA: Diagnosis not present

## 2024-09-29 DIAGNOSIS — I672 Cerebral atherosclerosis: Secondary | ICD-10-CM | POA: Diagnosis not present

## 2024-09-29 DIAGNOSIS — Z7901 Long term (current) use of anticoagulants: Secondary | ICD-10-CM

## 2024-09-29 DIAGNOSIS — I4891 Unspecified atrial fibrillation: Secondary | ICD-10-CM | POA: Diagnosis not present

## 2024-09-29 DIAGNOSIS — J810 Acute pulmonary edema: Secondary | ICD-10-CM | POA: Diagnosis not present

## 2024-09-29 DIAGNOSIS — R569 Unspecified convulsions: Secondary | ICD-10-CM | POA: Diagnosis not present

## 2024-09-29 DIAGNOSIS — I5033 Acute on chronic diastolic (congestive) heart failure: Secondary | ICD-10-CM | POA: Diagnosis not present

## 2024-09-29 DIAGNOSIS — I63521 Cerebral infarction due to unspecified occlusion or stenosis of right anterior cerebral artery: Secondary | ICD-10-CM | POA: Diagnosis not present

## 2024-09-29 LAB — MAGNESIUM: Magnesium: 2.4 mg/dL (ref 1.7–2.4)

## 2024-09-29 LAB — COMPREHENSIVE METABOLIC PANEL WITH GFR
ALT: 32 U/L (ref 0–44)
AST: 60 U/L — ABNORMAL HIGH (ref 15–41)
Albumin: 3.3 g/dL — ABNORMAL LOW (ref 3.5–5.0)
Alkaline Phosphatase: 87 U/L (ref 38–126)
Anion gap: 12 (ref 5–15)
BUN: 21 mg/dL (ref 8–23)
CO2: 25 mmol/L (ref 22–32)
Calcium: 8.1 mg/dL — ABNORMAL LOW (ref 8.9–10.3)
Chloride: 93 mmol/L — ABNORMAL LOW (ref 98–111)
Creatinine, Ser: 0.95 mg/dL (ref 0.44–1.00)
GFR, Estimated: >60 mL/min (ref >60)
Glucose, Bld: 118 mg/dL — ABNORMAL HIGH (ref 70–99)
Potassium: 3.9 mmol/L (ref 3.5–5.1)
Sodium: 130 mmol/L — ABNORMAL LOW (ref 135–145)
Total Bilirubin: 1.7 mg/dL — ABNORMAL HIGH (ref 0.0–1.2)
Total Protein: 6.3 g/dL — ABNORMAL LOW (ref 6.5–8.1)

## 2024-09-29 LAB — ECHOCARDIOGRAM COMPLETE
Area-P 1/2: 4.8 cm2
Calc EF: 35.6 %
Est EF: 35
Height: 63 in
S' Lateral: 2.8 cm
Single Plane A2C EF: 43.4 %
Single Plane A4C EF: 29.5 %
Weight: 1883.61 [oz_av]

## 2024-09-29 LAB — CBC
HCT: 39 % (ref 36.0–46.0)
Hemoglobin: 13.2 g/dL (ref 12.0–15.0)
MCH: 29.4 pg (ref 26.0–34.0)
MCHC: 33.8 g/dL (ref 30.0–36.0)
MCV: 86.9 fL (ref 80.0–100.0)
Platelets: 224 K/uL (ref 150–400)
RBC: 4.49 MIL/uL (ref 3.87–5.11)
RDW: 14 % (ref 11.5–15.5)
WBC: 4.9 K/uL (ref 4.0–10.5)
nRBC: 0 % (ref 0.0–0.2)

## 2024-09-29 LAB — GLUCOSE, CAPILLARY: Glucose-Capillary: 171 mg/dL — ABNORMAL HIGH (ref 70–99)

## 2024-09-29 MED ORDER — SODIUM CHLORIDE 0.9 % IV SOLN: INTRAVENOUS | Status: AC

## 2024-09-29 MED ORDER — IOHEXOL 350 MG/ML SOLN
75.0000 mL | Freq: Once | INTRAVENOUS | Status: AC | PRN
  Administered 2024-09-29: 75 mL via INTRAVENOUS

## 2024-09-29 MED ORDER — LEVETIRACETAM (KEPPRA) 500 MG/5 ML ADULT IV PUSH
500.0000 mg | Freq: Two times a day (BID) | INTRAVENOUS | Status: DC
  Administered 2024-09-30: 500 mg via INTRAVENOUS
  Filled 2024-09-29: qty 5

## 2024-09-29 MED ORDER — LEVETIRACETAM (KEPPRA) 500 MG/5 ML ADULT IV PUSH
1000.0000 mg | Freq: Once | INTRAVENOUS | Status: AC
  Administered 2024-09-29: 1000 mg via INTRAVENOUS
  Filled 2024-09-29: qty 10

## 2024-09-29 MED ORDER — AMIODARONE HCL IN DEXTROSE 360-4.14 MG/200ML-% IV SOLN
60.0000 mg/h | INTRAVENOUS | Status: AC
  Administered 2024-09-29 (×2): 60 mg/h via INTRAVENOUS
  Filled 2024-09-29 (×2): qty 200

## 2024-09-29 MED ORDER — AMIODARONE LOAD VIA INFUSION
150.0000 mg | Freq: Once | INTRAVENOUS | Status: AC
  Administered 2024-09-29: 150 mg via INTRAVENOUS
  Filled 2024-09-29: qty 83.34

## 2024-09-29 MED ORDER — AMIODARONE HCL IN DEXTROSE 360-4.14 MG/200ML-% IV SOLN
30.0000 mg/h | INTRAVENOUS | Status: DC
  Administered 2024-09-29 – 2024-10-03 (×7): 30 mg/h via INTRAVENOUS
  Filled 2024-09-29 (×5): qty 200

## 2024-09-29 NOTE — Progress Notes (Signed)
Glucose fingerstick 171

## 2024-09-29 NOTE — Progress Notes (Signed)
  Echocardiogram 2D Echocardiogram has been performed.  Destiny Elliott 09/29/2024, 2:03 PM

## 2024-09-29 NOTE — Progress Notes (Signed)
 Rounding Note   Patient Name: Nefertari Rebman Date of Encounter: 09/29/2024  [facility] Cardiologist: Lonni LITTIE Nanas, MD   Subjective - No acute events overnight - Remains tachycardic currently in atrial flutter with RVR - Reports some cough and her daughter reports some mental confusion. - She denies chest pain and SOB.   Scheduled Meds:  apixaban  5 mg Oral BID   ARIPiprazole   2 mg Oral Daily   furosemide  40 mg Intravenous BID   irbesartan  150 mg Oral Daily   metoprolol tartrate  50 mg Oral Q8H   Continuous Infusions:  PRN Meds: acetaminophen  **OR** acetaminophen , melatonin, ondansetron  (ZOFRAN ) IV   Vital Signs  Vitals:   09/29/24 0650 09/29/24 0837 09/29/24 0917 09/29/24 1237  BP: (!) 115/91 (!) 142/92 110/86 114/77  Pulse: (!) 128  (!) 122   Resp:  (!) 25 (!) 25   Temp:   98.4 F (36.9 C)   TempSrc:  Other (Comment) Oral   SpO2:  98% 96%   Weight:      Height:        Intake/Output Summary (Last 24 hours) at 09/29/2024 1245 Last data filed at 09/29/2024 1027 Gross per 24 hour  Intake 0 ml  Output 1100 ml  Net -1100 ml      09/29/2024    4:47 AM 09/28/2024    2:16 PM 09/27/2024    7:18 PM  Last 3 Weights  Weight (lbs) 117 lb 11.6 oz 124 lb 9.6 oz 131 lb 6.3 oz  Weight (kg) 53.4 kg 56.518 kg 59.6 kg      Telemetry Atrial flutter with RVR- Personally Reviewed  ECG  Atrial flutter with RVR- Personally Reviewed  Physical Exam  GEN: No acute distress.   Neck: No JVD Cardiac: Tachycardic with regularly irregular rhythm, no murmurs, rubs, or gallops.  Respiratory: Bilateral rales heard throughout, very faint rhonchi bilaterally, no wheezes GI: Soft, nontender, non-distended  MS: 1+ pitting edema bilaterally; No deformity. Neuro:  Nonfocal  Psych: Flat affect  Labs High Sensitivity Troponin:   Recent Labs  Lab 09/27/24 1923 09/27/24 2139 09/28/24 0053 09/28/24 0445  TROPONINIHS 27* 42* 53* 53*      Chemistry Recent Labs  Lab 09/28/24 0445 09/28/24 1226 09/28/24 1741 09/29/24 0540  NA 136 134* 132* 130*  K 3.0* 5.8* 5.1 3.9  CL 101 97* 95* 93*  CO2 24 25 22 25   GLUCOSE 107* 120* 118* 118*  BUN 13 16 15 21   CREATININE 0.72 1.01* 0.92 0.95  CALCIUM 7.3* 8.3* 8.5* 8.1*  MG 1.7 2.8*  --  2.4  PROT 5.6*  --   --  6.3*  ALBUMIN 3.2*  --   --  3.3*  AST 47*  --   --  60*  ALT 32  --   --  32  ALKPHOS 79  --   --  87  BILITOT 1.9*  --   --  1.7*  GFRNONAA >60 59* >60 >60  ANIONGAP 11 12 15 12     Lipids No results for input(s): CHOL, TRIG, HDL, LABVLDL, LDLCALC, CHOLHDL in the last 168 hours.  Hematology Recent Labs  Lab 09/27/24 1923 09/28/24 0445 09/29/24 0540  WBC 7.9 5.7 4.9  RBC 4.32 4.08 4.49  HGB 12.7 12.0 13.2  HCT 39.1 36.0 39.0  MCV 90.5 88.2 86.9  MCH 29.4 29.4 29.4  MCHC 32.5 33.3 33.8  RDW 14.1 14.1 14.0  PLT 255 227 224   Thyroid   Recent Labs  Lab 09/27/24 1923  TSH 1.129    BNP Recent Labs  Lab 09/28/24 0606  BNP 780.2*    DDimer No results for input(s): DDIMER in the last 168 hours.   Radiology  DG Chest 2 View Result Date: 09/27/2024 EXAM: 2 VIEW(S) XRAY OF THE CHEST 09/27/2024 07:46:00 PM COMPARISON: CXR 03/07/2023 CLINICAL HISTORY: chest pain FINDINGS: LUNGS AND PLEURA: Diffuse interstitial prominence and Kerley-B lines. Trace bilateral pleural effusions. No pneumothorax. HEART AND MEDIASTINUM: Cardiomegaly. Aortic calcification. BONES AND SOFT TISSUES: No acute osseous abnormality. IMPRESSION: 1. Pulmonary edema with  trace bilateral pleural effusions. 2. Cardiomegaly. Electronically signed by: Morgane Naveau MD 09/27/2024 07:52 PM EST RP Workstation: [id number]    Cardiac Studies  Echocardiogram 02/01/2024: Impressions: 1. Left ventricular ejection fraction, by estimation, is 60 to 65%. The  left ventricle has normal function. The left ventricle has no regional  wall motion abnormalities. Left ventricular diastolic  parameters are  consistent with Grade II diastolic  dysfunction (pseudonormalization). Elevated left ventricular end-diastolic  pressure.   2. Right ventricular systolic function is normal. The right ventricular  size is normal. There is moderately elevated pulmonary artery systolic  pressure.   3. Left atrial size was severely dilated.   4. The mitral valve is normal in structure. Mild mitral valve  regurgitation. No evidence of mitral stenosis. There is mild prolapse of  of the mitral valve.   5. The aortic valve is tricuspid. Aortic valve regurgitation is not  visualized. No aortic stenosis is present.   6. The inferior vena cava is normal in size with greater than 50%  respiratory variability, suggesting right atrial pressure of 3 mmHg.   Patient Profile   Tyresha Fede is a 73 y.o. female with a history of paroxysmal atrial fibrillation/ flutter on Eliquis, external SVT, hypertension, hyperlipidemia, and bipolar disorder who is being seen 09/28/2024 for the evaluation of atrial flutter with RVR at the request of Dr. Patria.   Assessment & Plan   #Atrial Fibrillation/Atrial Flutter w/ RVR #PSVT - Patient initially presented with atrial fibrillation with RVR in the setting of a prior history of atrial fibrillation. - Since being admitted she has had atrial fibrillation, atrial flutter and SVT.  Currently in atrial flutter with RVR. -Ultimately is going to be extremely challenging to rate control her despite high doses of metoprolol. -She will need DCCV which we will plan for tomorrow DCCV tomorrow 11/17 N.p.o. at midnight Continue Eliquis 5 mg twice daily Continue metoprolol to tartrate 50 mg 3 times daily Do not give IV metoprolol given that she is in decompensated CHF and we do not know her EF at this time.  If her rates become substantially faster than her current 130s, please notify cardiology and she will likely need amiodarone if that occurs Hold home atenolol  Hold home  diltiazem given CHF Follow-up echo    #New Acute CHF - She is floridly volume overloaded without any prior history of heart failure. -This is likely being driven by her RVR, although she did test positive for influenza A which could be a contributor as well. - Unclear if this is systolic or diastolic heart failure at this time pending echo. - Will continue to diurese and ultimately GDMT will depend on her systolic function. Complete echo Continue IV Lasix Strict I's and O's Daily weights     #HTN Continue irbesartan for now pending echo results        For questions or updates, please contact [facility] Please consult [email or web address] for contact info under       Signed, Georganna Archer, MD  09/29/2024, 12:45 PM

## 2024-09-29 NOTE — Progress Notes (Signed)
 Patient's heart rate increased to 166. VS documented. Artist Pouch ,PA notified new orders received. Patient husband and son in law at bedside.

## 2024-09-29 NOTE — Progress Notes (Signed)
 EEG complete - results pending

## 2024-09-29 NOTE — Progress Notes (Cosign Needed Addendum)
 LTM EEG hooked up and running - no initial skin breakdown - push button tested - [facility] monitoring. The leads are CT compatible, not compatible with MRI.

## 2024-09-29 NOTE — Progress Notes (Signed)
 Patient noted to be in A flutter per EKG. Heart rate 134 prn lopressor given per order.  Trudy, [place] notified. No new orders at this time.

## 2024-09-29 NOTE — Progress Notes (Signed)
  Progress Note   Patient: Destiny Elliott FMW:[id number] DOB: 1951/01/15 DOA: 09/27/2024     2 DOS: the patient was seen and examined on 09/29/2024   Brief hospital course: 73 y.o. female with medical history significant for paroxysmal atrial fibrillation recently started on Eliquis, send hypertension, chronic diastolic heart failure, who is admitted to [facility] on 09/27/2024 with atrial fibrillation with RVR after presenting from home to [facility] ED complaining of shortness of breath.    In the context of recent diagnosis of atrial fibrillation, the patient has been following with cardiology in atrial fibrillation clinic, on anticoag with plans for outpt cardioversion 11/17. Pt developed progressive shortness of breath over the last 2 days, associated with some orthopnea, new onset nonproductive cough   In the ED, pt was noted to have afib in RVR with evidence of vol overload.   Assessment and Plan: #) Atrial fibrillation with RVR:  -Cardiology consulted -on lopressor 50mg  q8h -cont to correct lytes as needed, goal K >4 and Mg>2 -cont eliquis -consideration for IV amiodarone noted if HR becomes substantially faster than 130's -Tentative plans for DCCV 11/17   #) Acute on chronic diastolic heart failure:  -Elevated BNP in excess of 780 with evidence of vol overload -continued on IV lasix bid -good diuresis with clinical improvement thus far -repeat 2d echo pending   #) Essential Hypertension:  -currently on avapro 150mg  and lopressor 50mg  TID    #) Depression:  -continue Abilify    #) Flu A + -Low grade temp at time of presentation -Flu A pos, covid neg, RSV neg -Per family, symptoms began almost 72hrs ago -Pt already reported felling improved after diuresis -Will hold off on antiviral and cont supportive care, pt's daughter at bedside agrees  #) Toxic metabolic encephalopathy -Likely secondary to acute illness, particularly flu -RN reports that pt has been sleeping  well overnight -Cont supportive care     Subjective: remained tachycardic overnight. Pt more confused  Physical Exam: Vitals:   09/29/24 1348 09/29/24 1349 09/29/24 1417 09/29/24 1448  BP: (!) 141/101 (!) 146/102 (!) 126/102 121/79  Pulse: (!) 165 (!) 165 (!) 151 (!) 126  Resp: (!) 28 (!) 25 (!) 28 (!) 29  Temp:    98.7 F (37.1 C)  TempSrc:    Axillary  SpO2: 95% 95% 94% 96%  Weight:      Height:       General exam: Conversant, in no acute distress Respiratory system: normal chest rise, clear, no audible wheezing Cardiovascular system:tachycardic, s1-s2 Gastrointestinal system: Nondistended, nontender Central nervous system: No seizures, no tremors Extremities: No cyanosis, no joint deformities Skin: No rashes, no pallor Psychiatry: difficult to assess given confusion  Data Reviewed:  Labs reviewed: Na 130, K 3.9, Cr 0.95, WBC 4.9, Hgb 13.2, Plts 224  Family Communication: Pt in room, family at bedside  Disposition: Status is: Inpatient Remains inpatient appropriate because: severity of illness  Planned Discharge Destination: Home    Author: Garnette Pelt, MD 09/29/2024 3:43 PM  For on call review [email or web address].

## 2024-09-29 NOTE — Progress Notes (Signed)
  Was notified of a push button event with L arm twitching at approximately 1925. EEG appears to show triphasics at approximately 2Hz . No clear evolution thou and had similar pattern close to 1600 and therefore ?arousal. The frequency is concerning thou so will give her 1mg  of ativan  and load her up with Keppra 1500mg  IV once and monitor EEG overnight.  Braelyn Jenson [facility]

## 2024-09-29 NOTE — Progress Notes (Signed)
 Patient has been in sinus tachy all night when I checked. The IV Amiodarone does not need  a doctor order because patient is not in Afib. Will continue to monitor.

## 2024-09-29 NOTE — Progress Notes (Signed)
 RRT nurse arrived to bedside

## 2024-09-29 NOTE — Consult Note (Addendum)
 NEUROLOGY CONSULT NOTE   Date of service: September 29, 2024 Patient Name: Destiny Elliott MRN:  [id number] DOB:  08-05-1951 Chief Complaint: Code stroke Requesting Provider: Cindy Garnette POUR, MD  History of Present Illness  Destiny Elliott is a 73 y.o. female with hx of A-fib on Eliquis, hypertension, hyperlipidemia, CHF, anxiety who presented to [facility] on 11/14 evaluation of shortness of breath and was admitted with a diagnosis of A-fib with RVR.  She is currently receiving Eliquis and on Amio drip Code stroke was called today at around 350 for acute change in mental status and aphasia.  Family at bedside and states that even yesterday she was having periods of staring off and unresponsiveness with some shaking of her left shoulder.  Today at around 80 daughter noticed that her left shoulder was twitching and patient then began having left forearm shaking and trouble speaking and called out for the nurse, and the charge nurse then called activated a code stroke Last known well is unclear.  CT head with no acute process.  CTA angio head and neck with no acute process.  NIHSS 9.  After CT scan speech was improving she was able to state her daughter's name and her daughter's husband's name.  Code stroke canceled  Will give Keppra 1000 mg x 1 then 500 mg twice daily.  Will check stat routine EEG  LKW: Unclear Modified rankin score: 0-Completely asymptomatic and back to baseline post- stroke IV Thrombolysis:  No likely not a stroke EVT:  No LVO  NIHSS components Score: Comment  1a Level of Conscious 0[]  1[x]  2[]  3[]      1b LOC Questions 0[]  1[]  2[x]       1c LOC Commands 0[]  1[]  2[x]       2 Best Gaze 0[]  1[]  2[]       3 Visual 0[]  1[]  2[]  3[]      4 Facial Palsy 0[]  1[]  2[]  3[]      5a Motor Arm - left 0[]  1[x]  2[]  3[]  4[]  UN[]    5b Motor Arm - Right 0[]  11 2[]  3[]  4[]  UN[]    6a Motor Leg - Left 0[]  1[]  2[]  3[]  4[]  UN[]    6b Motor Leg - Right 0[]  1[]  2[]  3[]  4[]  UN[]    7 Limb Ataxia  0[]  1[]  2[]  UN[]      8 Sensory 0[]  1[]  2[]  UN[]      9 Best Language 0[]  1[]  2[x]  3[]      10 Dysarthria 0[]  1[]  2[]  UN[]      11 Extinct. and Inattention 0[]  1[]  2[]       TOTAL: 9      ROS   Comprehensive ROS Unable to ascertain due to aphasia  Past History   Past Medical History:  Diagnosis Date   Abnormal Pap smear 1994   --CIN I   Allergic rhinitis    Anxiety    Dense breasts    Endocervical polyp 01/1993   -benign   Hypercholesterolemia    Hypertension    Osteopenia    Paroxysmal atrial fibrillation [facility])     Past Surgical History:  Procedure Laterality Date   CERVICAL BIOPSY  W/ LOOP ELECTRODE EXCISION  03/1993   CIN I   COLONOSCOPY  2016   endocervical polyp  01-27-93   -benign   TOOTH EXTRACTION  08/2017    Family History: Family History  Problem Relation Age of Onset   Stroke Father 71   Kidney disease Father    Hypertension Father    Hypertension Sister  50   Hyperlipidemia Sister    COPD Sister    Breast cancer Sister 50   Thyroid  disease Mother 65   Heart failure Mother    Heart disease Mother    Hypertension Mother    COPD Mother    Heart disease Brother 4       AFIB   Suicidality Son 57       07/20/2013   Thyroid  disease Sister 43    Social History  reports that she quit smoking about 20 years ago. Her smoking use included cigarettes. She started smoking about 48 years ago. She has a 14 Elliott-year smoking history. She has never used smokeless tobacco. She reports current alcohol use. She reports that she does not use drugs.  Allergies  Allergen Reactions   Ambien [Zolpidem Tartrate] Other (See Comments)    Causes depression   Gabapentin Other (See Comments)    Hypertension   Nickel Hives and Itching   Nortriptyline Other (See Comments)    Causes dry sinuses,dry mouth   Paxil [Paroxetine Hcl] Other (See Comments)    Agitation   Remeron [Mirtazapine] Other (See Comments)    Agitation and depression   Trazodone  Hcl Other (See Comments)     Agitation   Zoloft [Sertraline Hcl] Other (See Comments)    Agitation   Zolpidem Other (See Comments)    Other Reaction(s): personality change   Amlodipine Besylate Other (See Comments)    Patient cannot recall.    Benadryl  [Diphenhydramine  Hcl] Other (See Comments)    Causes nasal dryness   Buspar [Buspirone] Other (See Comments)    Abnormal sensory issues   Dextromethorphan-Guaifenesin Other (See Comments)    Made patient mucus passages extremely dry   Flonase [Fluticasone Propionate] Other (See Comments)    Reaction type/severity unknown    Nirmatrelvir-Ritonavir Diarrhea    Other Reaction(s): diarrhea   Tylenol  Pm Extra [Diphenhydramine -Apap (Sleep)] Other (See Comments)    Causes nasal dryness   Vitamin D  Analogs Other (See Comments)    doesn't take Vit D supplements because they make her feel bad     Medications   Current Facility-Administered Medications:    acetaminophen  (TYLENOL ) tablet 650 mg, 650 mg, Oral, Q6H PRN, 650 mg at 09/28/24 0454 **OR** acetaminophen  (TYLENOL ) suppository 650 mg, 650 mg, Rectal, Q6H PRN, Howerter, Justin B, DO   [COMPLETED] amiodarone (NEXTERONE) 1.8 mg/mL load via infusion 150 mg, 150 mg, Intravenous, Once, 150 mg at 09/29/24 1420 **FOLLOWED BY** amiodarone (NEXTERONE PREMIX) 360-4.14 MG/200ML-% (1.8 mg/mL) IV infusion, 60 mg/hr, Intravenous, Continuous, Last Rate: 33.3 mL/hr at 09/29/24 1420, 60 mg/hr at 09/29/24 1420 **FOLLOWED BY** amiodarone (NEXTERONE PREMIX) 360-4.14 MG/200ML-% (1.8 mg/mL) IV infusion, 30 mg/hr, Intravenous, Continuous, Williams, Evan, PA-C   apixaban (ELIQUIS) tablet 5 mg, 5 mg, Oral, BID, Howerter, Justin B, DO, 5 mg at 09/29/24 1013   ARIPiprazole  (ABILIFY ) tablet 2 mg, 2 mg, Oral, Daily, Howerter, Justin B, DO, 2 mg at 09/29/24 1017   furosemide (LASIX) injection 40 mg, 40 mg, Intravenous, BID, Howerter, Justin B, DO, 40 mg at 09/29/24 0900   irbesartan (AVAPRO) tablet 150 mg, 150 mg, Oral, Daily, Howerter, Justin B,  DO, 150 mg at 09/29/24 1012   melatonin tablet 3 mg, 3 mg, Oral, QHS PRN, Howerter, Justin B, DO   metoprolol tartrate (LOPRESSOR) tablet 50 mg, 50 mg, Oral, Q8H, Howerter, Justin B, DO, 50 mg at 09/29/24 1348   ondansetron  (ZOFRAN ) injection 4 mg, 4 mg, Intravenous, Q6H PRN, Howerter, Justin B, DO  Vitals   Vitals:   09/29/24 1348 09/29/24 1349 09/29/24 1417 09/29/24 1448  BP: (!) 141/101 (!) 146/102 (!) 126/102 121/79  Pulse: (!) 165 (!) 165 (!) 151 (!) 126  Resp: (!) 28 (!) 25 (!) 28 (!) 29  Temp:    98.7 F (37.1 C)  TempSrc:    Axillary  SpO2: 95% 95% 94% 96%  Weight:      Height:        Body mass index is 20.85 kg/m.   Physical Exam   Constitutional: Appears well-developed and well-nourished.   Psych: Affect appropriate to situation.   Eyes: No scleral injection.   HENT: No OP obstruction.   Head: Normocephalic.   Cardiovascular: Normal rate and regular rhythm.   Respiratory: Effort normal, non-labored breathing.   GI: Soft.  No distension. There is no tenderness.   Skin: WDI.    Neurologic Examination    Mental Status -  Patient is drowsy but easily arousable, unable to answer orientation questions nor follow commands consistently.  Expressive aphasia and some receptive aphasia  Cranial Nerves II - XII - II - Visual field intact OU. III, IV, VI - Extraocular movements intact . V - Facial sensation intact bilaterally . VII - Facial movement intact bilaterally . VIII - Hearing & vestibular intact bilaterally . X - Palate elevates symmetrically . XI - Chin turning & shoulder shrug intact bilaterally . XII - Tongue protrusion intact .  Motor Strength - The patient's strength was normal in bilateral upper extremities, bilateral lower extremities with drift bulk was normal and fasciculations were absent .   Motor Tone - Muscle tone was assessed at the neck and appendages and was normal . Sensory - Light touch, temperature/pinprick were assessed and were  symmetrical .   Coordination -unable to assess due to aphasia  Gait and Station - deferred.  Labs/Imaging/Neurodiagnostic studies   CBC:  Recent Labs  Lab 08-Oct-2024 0445 09/29/24 0540  WBC 5.7 4.9  NEUTROABS 4.7  --   HGB 12.0 13.2  HCT 36.0 39.0  MCV 88.2 86.9  PLT 227 224   Basic Metabolic Panel:  Lab Results  Component Value Date   NA 130 (L) 09/29/2024   K 3.9 09/29/2024   CO2 25 09/29/2024   GLUCOSE 118 (H) 09/29/2024   BUN 21 09/29/2024   CREATININE 0.95 09/29/2024   CALCIUM 8.1 (L) 09/29/2024   GFRNONAA >60 09/29/2024   GFRAA >60 03/21/2018   Lipid Panel:  Lab Results  Component Value Date   LDLCALC 99 03/21/2018   HgbA1c:  Lab Results  Component Value Date   HGBA1C 5.5 03/21/2018   Urine Drug Screen:     Component Value Date/Time   LABOPIA NONE DETECTED 03/20/2018 1511   COCAINSCRNUR NONE DETECTED 03/20/2018 1511   LABBENZ NONE DETECTED 03/20/2018 1511   AMPHETMU NONE DETECTED 03/20/2018 1511   THCU NONE DETECTED 03/20/2018 1511   LABBARB NONE DETECTED 03/20/2018 1511    Alcohol Level     Component Value Date/Time   ETH <10 03/21/2018 0643   INR  Lab Results  Component Value Date   INR 1.2 09/27/2024   APTT No results found for: APTT AED levels: No results found for: PHENYTOIN, ZONISAMIDE, LAMOTRIGINE, LEVETIRACETA  CT Head without contrast(Personally reviewed): No acute process  CT angio Head and Neck with contrast(Personally reviewed): No LVO   Neurodiagnostics rEEG:  Ordered  ASSESSMENT   Destiny Elliott is a 73 y.o. female hx of A-fib on Eliquis, hypertension,  hyperlipidemia, CHF, anxiety who presented to [facility] on 11/14 evaluation of shortness of breath and was admitted with a diagnosis of A-fib with RVR.  She is currently receiving Eliquis and on Amio drip Code stroke was called today at around 350 for acute change in mental status and aphasia.  Family at bedside and states that even yesterday she was having  periods of staring off and unresponsiveness with some shaking of her left shoulder.  Today at around 69 daughter noticed that her left shoulder was twitching and patient then began having trouble speaking and called out for the nurse, and the charge nurse then called activated a code stroke  RECOMMENDATIONS  -Keppra 1000 mg IV x 1 now then 500 mg IV every 12 hours - Stat routine EEG.  Depending on results of EEG and if patient with no improvement in mental status will consider LTM -Cancel code stroke -Neurology will continue to follow ______________________________________________________________________    Signed, Karna DELENA Geralds, NP [facility]  I have personally obtained history,examined this patient, reviewed notes, independently viewed imaging studies, participated in medical decision making and plan of care.ROS completed by me personally and pertinent positives fully documented  I have made any additions or clarifications directly to the above note. Agree with note above.  Patient with atrial fibrillation on Eliquis has had altered mental status since yesterday and this afternoon the daughter noticed focal jerking of the left shoulder and forearm possibly complex partial seizure.  CT head shows no acute abnormality and CT angiogram shows no LVO(my preliminary review).  Recommend IV Keppra 1 g now followed by 500 twice daily.  Check stat EEG and if there is ongoing irritability may need to consider overnight EEG monitoring.  Check MRI scan later.  Continue anticoagulation with a loop and maintain aggressive risk factor modification for stroke prevention.  Long discussion with the patient's daughter and husband at the bedside and answered questions.  Discussed with Dr. Cindy. This patient is critically ill and at significant risk of neurological worsening, death and care requires constant monitoring of vital signs, hemodynamics,respiratory and cardiac monitoring, extensive review of  multiple databases, frequent neurological assessment, discussion with family, other specialists and medical decision making of high complexity.I have made any additions or clarifications directly to the above note.This critical care time does not reflect procedure time, or teaching time or supervisory time of PA/NP/Med Resident etc but could involve care discussion time.  I spent 40 minutes of neurocritical care time  in the care of  this patient.     Eather Popp, MD Medical Director [facility] Pager: [phone] 09/29/2024 5:22 PM

## 2024-09-29 NOTE — Code Documentation (Signed)
  Stroke Response Nurse Documentation Code Documentation  Destiny Elliott is a 73 y.o. female admitted to [facility]  on 09/27/2024 for shortness of breath due to a-fib rvr with past medical hx of pAF on Eliquis, HTN, chronic dHF. On Eliquis (apixaban) daily. Code stroke was activated by charge RN .   Patient on 6Eunit where she were her LKW is unclear and now complaining of aphasia. Primary RN endorses that patient has only been oriented to self and city today. She has been very sleepy today. At 1545, family witnessed left shoulder twitching and alerted staff. When staff evaluated her, they found her to be aphasic and activated a code stroke  Stroke team at the bedside after patient activation. Patient to CT with team.   NIHSS 8, see documentation for details and code stroke times. Patient with disoriented, not following commands, bilateral leg weakness, and Global aphasia  on exam.   The following imaging was completed:  CT Head and CTA.   Patient is not a candidate for IV Thrombolytic due to being on Eliquis. Patient is not a candidate for IR due to imaging negative for LVO.   Care/Plan: Code stroke cancelled at 1640. EEG ordered and Keppra given.   Process Delays Noted: n/a  Bedside handoff with RN Destiny Elliott.    Destiny Elliott  Rapid Response RN

## 2024-09-30 ENCOUNTER — Encounter (HOSPITAL_COMMUNITY)
Admission: EM | Disposition: A | Discharge status: Home Health | Payer: Self-pay | Source: Home / Self Care | Attending: Internal Medicine

## 2024-09-30 ENCOUNTER — Ambulatory Visit (HOSPITAL_COMMUNITY): Admission: RE | Admit: 2024-09-30 | Source: Home / Self Care | Admitting: Cardiovascular Disease

## 2024-09-30 ENCOUNTER — Inpatient Hospital Stay (HOSPITAL_COMMUNITY)

## 2024-09-30 DIAGNOSIS — I5021 Acute systolic (congestive) heart failure: Secondary | ICD-10-CM

## 2024-09-30 DIAGNOSIS — I4819 Other persistent atrial fibrillation: Secondary | ICD-10-CM

## 2024-09-30 DIAGNOSIS — I959 Hypotension, unspecified: Secondary | ICD-10-CM

## 2024-09-30 DIAGNOSIS — I4891 Unspecified atrial fibrillation: Secondary | ICD-10-CM | POA: Diagnosis not present

## 2024-09-30 DIAGNOSIS — R569 Unspecified convulsions: Secondary | ICD-10-CM

## 2024-09-30 DIAGNOSIS — G459 Transient cerebral ischemic attack, unspecified: Secondary | ICD-10-CM | POA: Diagnosis not present

## 2024-09-30 DIAGNOSIS — J810 Acute pulmonary edema: Secondary | ICD-10-CM | POA: Diagnosis not present

## 2024-09-30 LAB — CBC
HCT: 40.3 % (ref 36.0–46.0)
Hemoglobin: 13.6 g/dL (ref 12.0–15.0)
MCH: 29.3 pg (ref 26.0–34.0)
MCHC: 33.7 g/dL (ref 30.0–36.0)
MCV: 86.9 fL (ref 80.0–100.0)
Platelets: 211 K/uL (ref 150–400)
RBC: 4.64 MIL/uL (ref 3.87–5.11)
RDW: 13.7 % (ref 11.5–15.5)
WBC: 3.9 K/uL — ABNORMAL LOW (ref 4.0–10.5)
nRBC: 0 % (ref 0.0–0.2)

## 2024-09-30 LAB — BASIC METABOLIC PANEL WITH GFR
Anion gap: 14 (ref 5–15)
BUN: 28 mg/dL — ABNORMAL HIGH (ref 8–23)
CO2: 26 mmol/L (ref 22–32)
Calcium: 7.9 mg/dL — ABNORMAL LOW (ref 8.9–10.3)
Chloride: 89 mmol/L — ABNORMAL LOW (ref 98–111)
Creatinine, Ser: 1.39 mg/dL — ABNORMAL HIGH (ref 0.44–1.00)
GFR, Estimated: 40 mL/min — ABNORMAL LOW (ref >60)
Glucose, Bld: 97 mg/dL (ref 70–99)
Potassium: 3.2 mmol/L — ABNORMAL LOW (ref 3.5–5.1)
Sodium: 129 mmol/L — ABNORMAL LOW (ref 135–145)

## 2024-09-30 LAB — LIPID PANEL
Cholesterol: 133 mg/dL (ref 0–200)
HDL: 24 mg/dL — ABNORMAL LOW (ref >40)
LDL Cholesterol: 94 mg/dL (ref 0–99)
Total CHOL/HDL Ratio: 5.5 ratio
Triglycerides: 75 mg/dL (ref <150)
VLDL: 15 mg/dL (ref 0–40)

## 2024-09-30 LAB — MAGNESIUM: Magnesium: 2.2 mg/dL (ref 1.7–2.4)

## 2024-09-30 LAB — HEMOGLOBIN A1C
Hgb A1c MFr Bld: 5.1 % (ref 4.8–5.6)
Mean Plasma Glucose: 99.67 mg/dL

## 2024-09-30 SURGERY — CARDIOVERSION (CATH LAB)
Anesthesia: Monitor Anesthesia Care

## 2024-09-30 MED ORDER — LEVETIRACETAM 500 MG PO TABS
500.0000 mg | ORAL_TABLET | Freq: Two times a day (BID) | ORAL | Status: DC
  Administered 2024-09-30 – 2024-10-04 (×8): 500 mg via ORAL
  Filled 2024-09-30 (×8): qty 1

## 2024-09-30 MED ORDER — POTASSIUM CHLORIDE CRYS ER 20 MEQ PO TBCR
40.0000 meq | EXTENDED_RELEASE_TABLET | ORAL | Status: AC
  Administered 2024-09-30 (×2): 40 meq via ORAL
  Filled 2024-09-30 (×2): qty 2

## 2024-09-30 NOTE — TOC CM/SW Note (Signed)
 Transition of Care [facility]) - Inpatient Brief Assessment   Patient Details  Name: Destiny Elliott MRN: [id number] Date of Birth: 1951/02/07  Transition of Care [facility]) CM/SW Contact:    Lauraine FORBES Saa, LCSWA Phone Number: 09/30/2024, 1:35 PM   Clinical Narrative:  1:35 PM Per chart review, patient resides at home with spouse. Patient has a PCP and insurance. Patient does not have SNF/HH/DME history. Patient's preferred pharmacy is [id number] [place]. No TOC needs identified at this time. TOC will continue to follow.  Transition of Care Asessment: Insurance and Status: Insurance coverage has been reviewed Patient has primary care physician: Yes Home environment has been reviewed: Private Residence Prior level of function:: N/A Prior/Current Home Services: No current home services Social Drivers of Health Review: SDOH reviewed no interventions necessary Readmission risk has been reviewed: Yes (Currently Yellow 18%) Transition of care needs: no transition of care needs at this time

## 2024-09-30 NOTE — Progress Notes (Signed)
 NEUROLOGY CONSULT FOLLOW UP NOTE   Date of service: September 30, 2024 Patient Name: Destiny Elliott MRN:  [id number] DOB:  04/30/1951  Interval Hx/subjective   LTM read negative for seizures. Patient alert and oriented. Daughter at bedside states she is back to her baseline.  She still has some mild word finding difficulties and slightly nonfluent speech but is a lot improved compared to yesterday.  Her cardiac ablation is postponed to tomorrow  PENDING MRI. Continue Eliquis.   Vitals   Vitals:   09/29/24 2135 09/30/24 0029 09/30/24 0529 09/30/24 0651  BP: (!) 84/58 (!) 126/93 111/79 114/83  Pulse: (!) 122 (!) 122  (!) 122  Resp:  20 17   Temp:  98.4 F (36.9 C) 98.4 F (36.9 C)   TempSrc:  Oral Oral   SpO2:  96% 96%   Weight:   52.9 kg   Height:         Body mass index is 20.67 kg/m.  Physical Exam   Constitutional: Appears well-developed and well-nourished.  Psych: Affect appropriate to situation.  Cardiovascular: Afib on monitor, controlled rate Respiratory: Effort normal, non-labored breathing. 2L O2.   Neurologic Examination   Neuro: Mental Status: Patient is awake, alert, oriented to person, place, month, year, and situation. Patient is able to give a clear and coherent history. Occasional word finding difficulties and a few paraphasic errors.  Nonfluent speech.  Good naming, comprehension and repetition.  Some intermittent slower responses.  Cranial Nerves: II: Visual Fields are full. Pupils are equal, round, and reactive to light.   III,IV, VI: EOMI without ptosis or diploplia.  V: Facial sensation is symmetric to light touch VII: Facial movement is symmetric.  VIII: hearing is intact to voice X: Uvula elevates symmetrically. No dysarthria.  XI: Shoulder shrug is symmetric. XII: tongue is midline without atrophy or fasciculations.  Motor: Tone is normal. Bulk is normal. 5/5 strength was present in all four extremities.  Sensory: Sensation is symmetric  to light touch in the arms and legs. Cerebellar: FNF slow, but intact bilaterally   Medications  Current Facility-Administered Medications:    0.9 %  sodium chloride  infusion, , Intravenous, Continuous, Fenton, Clint R, PA   acetaminophen  (TYLENOL ) tablet 650 mg, 650 mg, Oral, Q6H PRN, 650 mg at 09/28/24 0454 **OR** acetaminophen  (TYLENOL ) suppository 650 mg, 650 mg, Rectal, Q6H PRN, Howerter, Justin B, DO   [COMPLETED] amiodarone (NEXTERONE) 1.8 mg/mL load via infusion 150 mg, 150 mg, Intravenous, Once, 150 mg at 09/29/24 1420 **FOLLOWED BY** [EXPIRED] amiodarone (NEXTERONE PREMIX) 360-4.14 MG/200ML-% (1.8 mg/mL) IV infusion, 60 mg/hr, Intravenous, Continuous, Stopped at 09/29/24 2130 **FOLLOWED BY** amiodarone (NEXTERONE PREMIX) 360-4.14 MG/200ML-% (1.8 mg/mL) IV infusion, 30 mg/hr, Intravenous, Continuous, Trudy Birmingham, PA-C, Last Rate: 16.67 mL/hr at 09/30/24 0100, 30 mg/hr at 09/30/24 0100   apixaban (ELIQUIS) tablet 5 mg, 5 mg, Oral, BID, Howerter, Justin B, DO, 5 mg at 09/30/24 0038   ARIPiprazole  (ABILIFY ) tablet 2 mg, 2 mg, Oral, Daily, Howerter, Justin B, DO, 2 mg at 09/29/24 1017   furosemide (LASIX) injection 40 mg, 40 mg, Intravenous, BID, Howerter, Justin B, DO, 40 mg at 09/29/24 1820   irbesartan (AVAPRO) tablet 150 mg, 150 mg, Oral, Daily, Howerter, Justin B, DO, 150 mg at 09/29/24 1012   levETIRAcetam (KEPPRA) undiluted injection 500 mg, 500 mg, Intravenous, Q12H, Wolfe, Denise A, NP, 500 mg at 09/30/24 0456   melatonin tablet 3 mg, 3 mg, Oral, QHS PRN, Howerter, Justin B, DO   metoprolol tartrate (  LOPRESSOR) tablet 50 mg, 50 mg, Oral, Q8H, Howerter, Justin B, DO, 50 mg at 09/30/24 0651   ondansetron  (ZOFRAN ) injection 4 mg, 4 mg, Intravenous, Q6H PRN, Marcene Eva NOVAK, DO  Labs and Diagnostic Imaging   CBC:  Recent Labs  Lab 09/28/24 0445 09/29/24 0540 09/30/24 0630  WBC 5.7 4.9 3.9*  NEUTROABS 4.7  --   --   HGB 12.0 13.2 13.6  HCT 36.0 39.0 40.3  MCV 88.2 86.9  86.9  PLT 227 224 211    Basic Metabolic Panel:  Lab Results  Component Value Date   NA 130 (L) 09/29/2024   K 3.9 09/29/2024   CO2 25 09/29/2024   GLUCOSE 118 (H) 09/29/2024   BUN 21 09/29/2024   CREATININE 0.95 09/29/2024   CALCIUM 8.1 (L) 09/29/2024   GFRNONAA >60 09/29/2024   GFRAA >60 03/21/2018   Lipid Panel:  Lab Results  Component Value Date   LDLCALC 99 03/21/2018   HgbA1c:  Lab Results  Component Value Date   HGBA1C 5.5 03/21/2018   Urine Drug Screen:     Component Value Date/Time   LABOPIA NONE DETECTED 03/20/2018 1511   COCAINSCRNUR NONE DETECTED 03/20/2018 1511   LABBENZ NONE DETECTED 03/20/2018 1511   AMPHETMU NONE DETECTED 03/20/2018 1511   THCU NONE DETECTED 03/20/2018 1511   LABBARB NONE DETECTED 03/20/2018 1511    Alcohol Level     Component Value Date/Time   ETH <10 03/21/2018 0643   INR  Lab Results  Component Value Date   INR 1.2 09/27/2024   APTT No results found for: APTT AED levels: No results found for: PHENYTOIN, ZONISAMIDE, LAMOTRIGINE, LEVETIRACETA  CT Head without contrast(Personally reviewed): No acute process   CT angio Head and Neck with contrast(Personally reviewed): No LVO  MRI Brain(Personally reviewed): PENDING  LTM EEG 09/29/2024 2040 to 09/30/2024 0945 :  suggestive of generalized cerebral dysfunction ( encephalopathy). No seizures or epileptiform discharges were seen throughout the recording.   Assessment   Destiny Elliott is a 73 y.o. female with hx of A-fib on Eliquis, hypertension, hyperlipidemia, CHF, anxiety who presented 11/14 d/t shortness of breath, admitted with a diagnosis of A-fib with RVR and new acute CHF (systolic based on echo results), receiving Eliquis and on Amio drip.  Code stroke 11/16 1550 for acute change in mental status and aphasia.  Family at bedside states she was having periods of staring off and unresponsiveness with some shaking of her left shoulder, noted the day before.     Routine EEG negative. Button was pushed d/t LUE twitching 11/16 1925, eeg appears to show triphasics with no clear evolution. Given 1mg  Ativan  and loaded with 1500mg  Keppra, placed on LTM EEG, which is negative. Overnight, patient had some confusion noted.   She is pending cardioversion for diagnosed Afib with RVTR. Pending MRI brain to specify stroke vs TIA.   Impression: As she is back to baseline, TIA likely (but pending MRI to evaluate for stroke) combined with seizure-like activity due to Afib and hypotension complicated by transition from dilt to atenolol  with subsequent SOB and Acute CHF.    Recommendations   - Discontinue LTM - Continue Keppra 500mg Q12H  For stroke/TIA workup: - MRI Brain to evaluate for stroke vs TIA - Continue Eliquis - Frequent Neuro checks per stroke unit protocol - Lipid panel (ordered) - Statin - will be started if LDL>70 or otherwise medically indicated  Not on statin PTA - A1C - Antithrombotic - Eliquis - DVT ppx -  Eliquis - BP goal - normotensive as she is outside the permissive hypertension window. Defer to Cardiology for specific BP parameters.Would prefer to avoid hypotension.  - Stroke education - will be given - PT/OT/SLP ______________________________________________________________________   Signed, Rocky JAYSON Likes, NP [facility]   I have personally obtained history,examined this patient, reviewed notes, independently viewed imaging studies, participated in medical decision making and plan of care.ROS completed by me personally and pertinent positives fully documented  I have made any additions or clarifications directly to the above note. Agree with note above.  Patient's speech difficulties appear to be improving but are not back to baseline yet.  EEG and long-term monitoring do not show seizure activity but I will continue Keppra at the current dose of 5 mg twice daily for now.  Check MRI scan.  Long discussion with patient and  daughter at the bedside and answered questions.  Discussed with Dr. Cindy   I personally spent a total of  35 minutes in the care of the patient today including getting/reviewing separately obtained history, performing a medically appropriate exam/evaluation, counseling and educating, placing orders, referring and communicating with other health care professionals, documenting clinical information in the EHR, independently interpreting results, and coordinating care.         Eather Popp, MD Medical Director [facility] Pager: [phone] 09/30/2024 5:35 PM

## 2024-09-30 NOTE — Procedures (Addendum)
 Patient Name: Maeci Kalbfleisch  MRN: [id number]  Epilepsy Attending: Arlin MALVA Krebs  Referring Physician/Provider: Waddell Karna LABOR, NP  Date: 09/29/2024 Duration: 25.32 mins  Patient history: 73yo F with left shoulder twitching and aphasia. EEG to evaluate for seizure.  Level of alertness: Awake  AEDs during EEG study: LEV  Technical aspects: This EEG study was done with scalp electrodes positioned according to the 10-20 International system of electrode placement. Electrical activity was reviewed with band pass filter of 1-70Hz , sensitivity of 7 uV/mm, display speed of 80mm/sec with a 60Hz  notched filter applied as appropriate. EEG data were recorded continuously and digitally stored.  Video monitoring was available and reviewed as appropriate.  Description: EEG showed continuous generalized 3 to 6 Hz theta-delta slowing. Hyperventilation and photic stimulation were not performed.     ABNORMALITY - Continuous slow, generalized  IMPRESSION: This study is suggestive of generalized cerebral dysfunction ( encephalopathy). No seizures or epileptiform discharges were seen throughout the recording.  Demetrius Barrell O Sharese Manrique

## 2024-09-30 NOTE — Progress Notes (Signed)
 Rounding Note   Patient Name: Destiny Elliott Date of Encounter: 09/30/2024  [facility] Cardiologist: Lonni LITTIE Nanas, MD   Subjective No CP or dyspnea  Scheduled Meds:  apixaban  5 mg Oral BID   ARIPiprazole   2 mg Oral Daily   furosemide  40 mg Intravenous BID   irbesartan  150 mg Oral Daily   levETIRAcetam  500 mg Intravenous Q12H   metoprolol tartrate  50 mg Oral Q8H   Continuous Infusions:  sodium chloride      amiodarone 30 mg/hr (09/30/24 0100)   PRN Meds: acetaminophen  **OR** acetaminophen , melatonin, ondansetron  (ZOFRAN ) IV   Vital Signs  Vitals:   09/29/24 2135 09/30/24 0029 09/30/24 0529 09/30/24 0651  BP: (!) 84/58 (!) 126/93 111/79 114/83  Pulse: (!) 122 (!) 122  (!) 122  Resp:  20 17   Temp:  98.4 F (36.9 C) 98.4 F (36.9 C)   TempSrc:  Oral Oral   SpO2:  96% 96%   Weight:   52.9 kg   Height:        Intake/Output Summary (Last 24 hours) at 09/30/2024 0804 Last data filed at 09/29/2024 1718 Gross per 24 hour  Intake 99.05 ml  Output 1100 ml  Net -1000.95 ml      09/30/2024    5:29 AM 09/29/2024    4:47 AM 09/28/2024    2:16 PM  Last 3 Weights  Weight (lbs) 116 lb 11.2 oz 117 lb 11.6 oz 124 lb 9.6 oz  Weight (kg) 52.935 kg 53.4 kg 56.518 kg      Telemetry Atrial fibrillation rate elevated - Personally Reviewed    Physical Exam  GEN: No acute distress.   Neck: No JVD Cardiac: irregular, tachycardic Respiratory: Clear to auscultation bilaterally. GI: Soft, nontender, non-distended  MS: No edema; No deformity. Neuro:  Nonfocal  Psych: Normal affect   Labs High Sensitivity Troponin:   Recent Labs  Lab 09/27/24 1923 09/27/24 2139 09/28/24 0053 09/28/24 0445  TROPONINIHS 27* 42* 53* 53*     Chemistry Recent Labs  Lab 09/28/24 0445 09/28/24 1226 09/28/24 1741 09/29/24 0540 09/30/24 0630  NA 136 134* 132* 130* 129*  K 3.0* 5.8* 5.1 3.9 3.2*  CL 101 97* 95* 93* 89*  CO2 24 25 22 25 26   GLUCOSE  107* 120* 118* 118* 97  BUN 13 16 15 21  28*  CREATININE 0.72 1.01* 0.92 0.95 1.39*  CALCIUM 7.3* 8.3* 8.5* 8.1* 7.9*  MG 1.7 2.8*  --  2.4 2.2  PROT 5.6*  --   --  6.3*  --   ALBUMIN 3.2*  --   --  3.3*  --   AST 47*  --   --  60*  --   ALT 32  --   --  32  --   ALKPHOS 79  --   --  87  --   BILITOT 1.9*  --   --  1.7*  --   GFRNONAA >60 59* >60 >60 40*  ANIONGAP 11 12 15 12 14      Hematology Recent Labs  Lab 09/28/24 0445 09/29/24 0540 09/30/24 0630  WBC 5.7 4.9 3.9*  RBC 4.08 4.49 4.64  HGB 12.0 13.2 13.6  HCT 36.0 39.0 40.3  MCV 88.2 86.9 86.9  MCH 29.4 29.4 29.3  MCHC 33.3 33.8 33.7  RDW 14.1 14.0 13.7  PLT 227 224 211   Thyroid   Recent Labs  Lab 09/27/24 1923  TSH 1.129    BNP  Recent Labs  Lab 09/28/24 0606  BNP 780.2*     Radiology  CT ANGIO HEAD NECK W WO CM (CODE STROKE) Result Date: 09/29/2024 EXAM: CTA HEAD AND NECK WITHOUT AND WITH 09/29/2024 04:34:52 PM TECHNIQUE: CTA of the head and neck was performed without and with the administration of 75 mL of intravenous iohexol (OMNIPAQUE) 350 MG/ML injection. Multiplanar 2D and/or 3D reformatted images are provided for review. Automated exposure control, iterative reconstruction, and/or weight based adjustment of the mA/kV was utilized to reduce the radiation dose to as low as reasonably achievable. Stenosis of the internal carotid arteries measured using NASCET criteria. COMPARISON: MR head 09/04/2017 CLINICAL HISTORY: Neuro deficit, acute, stroke suspected. FINDINGS: CTA NECK: AORTIC ARCH AND ARCH VESSELS: Atherosclerotic changes are present at the distal aortic arch. No dissection or arterial injury. No significant stenosis of the brachiocephalic or subclavian arteries. CERVICAL CAROTID ARTERIES: No dissection, arterial injury, or hemodynamically significant stenosis by NASCET criteria. CERVICAL VERTEBRAL ARTERIES: A left vertebral artery is dominant. No dissection, arterial injury, or significant stenosis. LUNGS  AND MEDIASTINUM: Unremarkable. SOFT TISSUES: No acute abnormality. BONES: No acute abnormality. CTA HEAD: ANTERIOR CIRCULATION: No significant stenosis of the internal carotid arteries. No significant stenosis of the anterior cerebral arteries. No significant stenosis of the middle cerebral arteries. No aneurysm. POSTERIOR CIRCULATION: No significant stenosis of the posterior cerebral arteries. No significant stenosis of the basilar artery. No significant stenosis of the vertebral arteries. No aneurysm. OTHER: No dural venous sinus thrombosis on this non-dedicated study. IMPRESSION: 1. No large vessel occlusion, hemodynamically significant stenosis, or aneurysm in the head or neck. 2. Atherosclerotic changes at the distal aortic arch. Electronically signed by: Lonni Necessary MD 09/29/2024 04:45 PM EST RP Workstation: [id number]   CT HEAD CODE STROKE WO CONTRAST Result Date: 09/29/2024 EXAM: CT HEAD WITHOUT CONTRAST 09/29/2024 04:22:01 PM TECHNIQUE: CT of the head was performed without the administration of intravenous contrast. Automated exposure control, iterative reconstruction, and/or weight based adjustment of the mA/kV was utilized to reduce the radiation dose to as low as reasonably achievable. COMPARISON: MR head 09/04/2017. CLINICAL HISTORY: Neuro deficit, acute, stroke suspected. FINDINGS: BRAIN AND VENTRICLES: No acute hemorrhage. An age indeterminate anterior right frontal lobe infarct is present. Ventricular and subcortical white matter hypoattenuation has progressed since the prior study and is moderately advanced for age. Atherosclerotic calcifications are present in the cavernous carotid arteries bilaterally. No hyperdense vessel is present. No hydrocephalus. No extra-axial collection. No mass effect or midline shift. ORBITS: No acute abnormality. SINUSES: No acute abnormality. SOFT TISSUES AND SKULL: No acute soft tissue abnormality. No skull fracture. [place] Stroke Program Early CT Score  (ASPECTS) ----- Ganglionic (caudate, ic, lentiform nucleus, insula, M1-m3): 6 Supraganglionic (m4-m6): 3 Total: 9 IMPRESSION: 1. Age-indeterminate anterior right frontal lobe infarct; no CT evidence of acute intracranial hemorrhage or mass effect. 2. Moderately advanced ventricular and subcortical white matter hypoattenuation, progressed since the prior study. 3. Atherosclerotic calcifications in the cavernous carotid arteries bilaterally; no hyperdense vessel. 4. aspects: 9. 5. these results were communicated to Dr. Rosemarie at 4:29 PM on 09/29/2024 by secure text page via the [facility] messaging system. Electronically signed by: Lonni Necessary MD 09/29/2024 04:32 PM EST RP Workstation: [id number]   ECHOCARDIOGRAM COMPLETE Result Date: 09/29/2024    ECHOCARDIOGRAM REPORT   Patient Name:   Destiny Elliott Date of Exam: 09/29/2024 Medical Rec #:  [id number]         Height:       63.0 in Accession #:  [id number]        Weight:       117.7 lb Date of Birth:  Dec 08, 1950         BSA:          1.544 m Patient Age:    73 years          BP:           115/91 mmHg Patient Gender: F                 HR:           127 bpm. Exam Location:  Inpatient Procedure: 2D Echo, Cardiac Doppler and Color Doppler (Both Spectral and Color            Flow Doppler were utilized during procedure). Indications:    I50.40* Unspecified combined systolic (congestive) and diastolic                 (congestive) heart failure  History:        Patient has prior history of Echocardiogram examinations. CHF,                 Abnormal ECG, Arrythmias:Atrial Fibrillation;                 Signs/Symptoms:Dyspnea and Shortness of Breath.  Sonographer:    Ellouise Mose RDCS Referring Phys: [id number] EVA KATHEE PORE  Sonographer Comments: Technically difficult study due to poor echo windows. Patient appears to have AMS, could not recall her DOB. HR has been high since previous day. IMPRESSIONS  1. Patient tachycardic throughout exam.  2. Left ventricular  ejection fraction, by estimation, is 35%. Left ventricular ejection fraction by 2D MOD biplane is 35.6 %. The left ventricle has moderately decreased function. The left ventricle demonstrates global hypokinesis. There is mild concentric left ventricular hypertrophy. Diastology indeterminate due to atrail arrhythmia.  3. Right ventricular systolic function is mildly reduced. The right ventricular size is normal. Tricuspid regurgitation signal is inadequate for assessing PA pressure.  4. There is mild prolapse of the anterior mitral valve leaflet. The mitral valve is abnormal. Mild mitral valve regurgitation. No evidence of mitral stenosis. There is mild prolapse of of the mitral valve.  5. The aortic valve is tricuspid. Aortic valve regurgitation is not visualized. No aortic stenosis is present.  6. The inferior vena cava is dilated in size with >50% respiratory variability, suggesting right atrial pressure of 8 mmHg. Comparison(s): Changes from prior study are noted. Newly reduced LV systolic function. FINDINGS  Left Ventricle: Left ventricular ejection fraction, by estimation, is 35%. Left ventricular ejection fraction by 2D MOD biplane is 35.6 %. The left ventricle has moderately decreased function. The left ventricle demonstrates global hypokinesis. The left  ventricular internal cavity size was normal in size. There is mild concentric left ventricular hypertrophy. Diastology indeterminate due to atrail arrhythmia. Right Ventricle: The right ventricular size is normal. No increase in right ventricular wall thickness. Right ventricular systolic function is mildly reduced. Tricuspid regurgitation signal is inadequate for assessing PA pressure. Left Atrium: Left atrial size was normal in size. Right Atrium: Right atrial size was normal in size. Pericardium: There is no evidence of pericardial effusion. Mitral Valve: There is mild prolapse of the anterior mitral valve leaflet. The mitral valve is abnormal. There is  mild prolapse of of the mitral valve. There is moderate thickening of the anterior and posterior mitral valve leaflet(s). Mild mitral valve regurgitation. No evidence of mitral valve stenosis. Tricuspid Valve: The  tricuspid valve is normal in structure. Tricuspid valve regurgitation is trivial. No evidence of tricuspid stenosis. Aortic Valve: The aortic valve is tricuspid. Aortic valve regurgitation is not visualized. No aortic stenosis is present. Pulmonic Valve: The pulmonic valve was normal in structure. Pulmonic valve regurgitation is trivial. No evidence of pulmonic stenosis. Aorta: The aortic root and ascending aorta are structurally normal, with no evidence of dilitation. Venous: The inferior vena cava is dilated in size with greater than 50% respiratory variability, suggesting right atrial pressure of 8 mmHg. IAS/Shunts: No atrial level shunt detected by color flow Doppler.  LEFT VENTRICLE PLAX 2D                        Biplane EF (MOD) LVIDd:         3.80 cm         LV Biplane EF:   Left LVIDs:         2.80 cm                          ventricular LV PW:         1.80 cm                          ejection LV IVS:        1.40 cm                          fraction by LVOT diam:     2.30 cm                          2D MOD LV SV:         47                               biplane is LV SV Index:   31                               35.6 %. LVOT Area:     4.15 cm  LV Volumes (MOD) LV vol d, MOD    44.9 ml A2C: LV vol d, MOD    31.9 ml A4C: LV vol s, MOD    25.4 ml A2C: LV vol s, MOD    22.5 ml A4C: LV SV MOD A2C:   19.5 ml LV SV MOD A4C:   31.9 ml LV SV MOD BP:    15.0 ml RIGHT VENTRICLE             IVC RV S prime:     10.80 cm/s  IVC diam: 2.20 cm TAPSE (M-mode): 1.6 cm LEFT ATRIUM             Index        RIGHT ATRIUM           Index LA diam:        2.60 cm 1.68 cm/m   RA Area:     16.80 cm LA Vol (A2C):   22.7 ml 14.71 ml/m  RA Volume:   44.90 ml  29.09 ml/m LA Vol (A4C):   51.8 ml 33.56 ml/m LA Biplane Vol:  35.2 ml 22.80 ml/m  AORTIC VALVE LVOT Vmax:   79.00 cm/s LVOT Vmean:  53.300 cm/s LVOT VTI:    0.114 m  AORTA Ao Root diam: 3.20 cm Ao Asc diam:  3.40 cm MITRAL VALVE MV Area (PHT): 4.80 cm    SHUNTS MV Decel Time: 158 msec    Systemic VTI:  0.11 m MV E velocity: 87.50 cm/s  Systemic Diam: 2.30 cm Georganna Archer Electronically signed by Georganna Archer Signature Date/Time: 09/29/2024/2:32:53 PM    Final     Patient Profile   Lyn Joens is a 73 y.o. female with a history of paroxysmal atrial fibrillation/ flutter on Eliquis, external SVT, hypertension, hyperlipidemia, and bipolar disorder who is being seen 09/28/2024 for the evaluation of atrial flutter with RVR.  Echocardiogram this admission shows ejection fraction 35%, mild left ventricular hypertrophy, mild RV dysfunction, mild mitral regurgitation.  LV function newly reduced compared to echocardiogram March 2025.  Assessment & Plan  1 atrial fibrillation/flutter-heart rate remains elevated.  She is now on IV amiodarone.  She has been on apixaban and has missed no doses.  Plan has been to proceed with cardioversion today.  Will hold metoprolol as her blood pressure has been low at times.  Once sinus is reestablished we will consider resuming based on follow-up blood pressure.  Ultimately would plan to continue IV amiodarone for approximately 8 weeks and then discontinue given her young age.  Hopefully atrial arrhythmias were precipitated by influenza.  Can also consider addition of flecainide  or referral for ablation in the future.  2 cardiomyopathy-etiology unclear.  Question tachycardia mediated.  Continue amiodarone for rate control and proceed with cardioversion.  Will hold ARB and beta-blocker for now.  Will resume beta-blocker later if blood pressure allows and add Entresto.  Intermittently hypotensive so we will hold at this point.  3 acute systolic congestive heart failure-patient is not markedly volume overloaded on examination.   Intermittently hypotensive.  Would hold Lasix for now and follow.  4 hyponatremia-we will hold Lasix for now.  Follow.  5 influenza A-per primary service.  6 question seizures-neurology is following.  Felt presentation was more consistent with seizures than CVA.  MRI pending.  7 hypertension-blood pressure has been low at times.  Will hold antihypertensives and follow following cardioversion.  For questions or updates, please contact [facility]   Signed, Redell Shallow, MD  09/30/2024, 8:04 AM

## 2024-09-30 NOTE — Progress Notes (Signed)
  Progress Note   Patient: Destiny Elliott FMW:[id number] DOB: 13-Nov-1951 DOA: 09/27/2024     3 DOS: the patient was seen and examined on 09/30/2024   Brief hospital course: 73 y.o. female with medical history significant for paroxysmal atrial fibrillation recently started on Eliquis, send hypertension, chronic diastolic heart failure, who is admitted to [facility] on 09/27/2024 with atrial fibrillation with RVR after presenting from home to [facility] ED complaining of shortness of breath.    In the context of recent diagnosis of atrial fibrillation, the patient has been following with cardiology in atrial fibrillation clinic, on anticoag with plans for outpt cardioversion 11/17. Pt developed progressive shortness of breath over the last 2 days, associated with some orthopnea, new onset nonproductive cough   In the ED, pt was noted to have afib in RVR with evidence of vol overload.   Assessment and Plan: #) Atrial fibrillation with RVR:  -Cardiology consulted -lopressor on hold given soft BP -cont to correct lytes as needed, goal K >4 and Mg>2 -cont eliquis -Ultimately plan to continue IV amiodarone for 8 weeks, then discontinue given her young age -Tentative plans for DCCV pending, however discussed with Neurology (Dr. Rosemarie), who says to delay cardioversion until OK with Neurology   #) Acute on chronic diastolic systolic heart failure:  -Elevated BNP in excess of 780 with evidence of vol overload -Pt was given IV lasix, now on hold given intermittent hypotension -good diuresis with clinical improvement thus far -2d echo notable for EF 35%   #) Essential Hypertension:  -was on avapro 150mg  and lopressor 50mg  TID, now on hold given intermittent hypotension    #) Depression:  -continue Abilify    #) Flu A + -Low grade temp at time of presentation -Flu A pos, covid neg, RSV neg -Per family, symptoms began almost 72hrs ago -Pt already reported felling improved after  diuresis -Will hold off on antiviral and cont supportive care, pt's daughter at bedside agrees  #) Toxic metabolic encephalopathy with concern for possible seizure -See rapid response on 11/16 -Concerns for seizures noted.  -Continued on keppra per Neuro -MRI brain pending     Subjective: More alert and oriented. Family at bedside reports pt appears better this AM  Physical Exam: Vitals:   09/30/24 0651 09/30/24 0800 09/30/24 1136 09/30/24 1540  BP: 114/83 105/86 101/72 113/79  Pulse: (!) 122 (!) 119 (!) 105 (!) 102  Resp:  20 20 20   Temp:  98.4 F (36.9 C) 97.8 F (36.6 C) 97.8 F (36.6 C)  TempSrc:   Axillary Oral  SpO2:  98% 95% 95%  Weight:      Height:       General exam: Awake, laying in bed, in nad Respiratory system: Normal respiratory effort, no wheezing Cardiovascular system: regular rate, s1, s2 Gastrointestinal system: Soft, nondistended, positive BS Central nervous system: CN2-12 grossly intact, strength intact Extremities: Perfused, no clubbing Skin: Normal skin turgor, no notable skin lesions seen Psychiatry: Mood normal // affect normal  Data Reviewed:  Labs reviewed: Na 129, K 3.2, Cr 1.39, WBC 3.9, Hgb 13.6, Plts 211  Family Communication: Pt in room, family at bedside  Disposition: Status is: Inpatient Remains inpatient appropriate because: severity of illness  Planned Discharge Destination: Home    Author: Garnette Pelt, MD 09/30/2024 4:32 PM  For on call review [email or web address].

## 2024-09-30 NOTE — CV Procedure (Signed)
    Electrical Cardioversion Procedure Note Dorleen Kissel [id number] Dec 29, 1950  Procedure: Electrical Cardioversion Indications:  Atrial Flutter  Time Out: Verified patient identification, verified procedure,medications/allergies/relevent history reviewed, required imaging and test results available.  {time out performed:[id number]}  Procedure Details  During this procedure the patient is administered a total of Propofol *** mg and Lidocaine *** mg to achieve and maintain moderate conscious sedation.  The patient's heart rate, blood pressure, and oxygen saturation are monitored continuously during the procedure. The period of conscious sedation is *** minutes, of which I was present face-to-face 100% of this time. *** is an independent, trained observer who assisted in the monitoring of the patient's level of consciousness. {TIP  Include the name and credentials for the independent observer  :1}    Cardioversion was done with synchronized biphasic defibrillation with AP pads with ***watts.  The patient converted to normal sinus rhythm. The patient tolerated the procedure well   IMPRESSION:  Successful cardioversion of atrial fibrillation    Mead Slane 09/30/2024, 7:47 PM

## 2024-09-30 NOTE — Procedures (Addendum)
 Patient Name: Destiny Elliott  MRN: [id number]  Epilepsy Attending: Arlin MALVA Krebs  Referring Physician/Provider: Khaliqdina, Salman, MD  Duration: 09/29/2024 2040 to 09/30/2024 1205   Patient history: 73yo F with left shoulder twitching and aphasia. EEG to evaluate for seizure.   Level of alertness: Awake, asleep   AEDs during EEG study: LEV   Technical aspects: This EEG study was done with scalp electrodes positioned according to the 10-20 International system of electrode placement. Electrical activity was reviewed with band pass filter of 1-70Hz , sensitivity of 7 uV/mm, display speed of 59mm/sec with a 60Hz  notched filter applied as appropriate. EEG data were recorded continuously and digitally stored.  Video monitoring was available and reviewed as appropriate.   Description: The posterior dominant rhythm consists of 7.5 Hz activity of moderate voltage (25-35 uV) seen predominantly in posterior head regions, symmetric and reactive to eye opening and eye closing.Sleep was characterized by vertex waves, sleep spindles (12 to 14 Hz), maximal frontocentral region. EEG showed continuous generalized 3 to 6 Hz theta-delta slowing. Hyperventilation and photic stimulation were not performed.      ABNORMALITY - Continuous slow, generalized   IMPRESSION: This study is suggestive of generalized cerebral dysfunction ( encephalopathy). No seizures or epileptiform discharges were seen throughout the recording.   Shantese Raven O Debar Plate

## 2024-09-30 NOTE — Progress Notes (Signed)
 Tried to give patient her Eliquis and Metoprolol but patient not understanding and spit pills out. Patient's daughter was getting upset so I reassured her that it was ok and we would try again later.    Later I came back and Patient was more clear and was able to take the Eliquis.The daughter refused the metoprolol because the BP before I took the one to see if it was safe to give the metoprolol had been low and daughter was worried.     Also tried to get the consent signed for the cardioversion but daughter wanted to wait. Will try to get consent signed in morning or will pass on to day shift as cardioversion is not scheduled until 0945.

## 2024-09-30 NOTE — Progress Notes (Signed)
 Updated patient and family of rescheduled DCCV 11/18. Assisted patient OOB to chair at this time.

## 2024-09-30 NOTE — Progress Notes (Addendum)
 Per report that patient had converted to NSR, EKG obtained and placed in chart. Daughter at bedside, consent has been signed. Patient/daughter aware of procedure/DCCV this AM. Patient has been NPO!

## 2024-09-30 NOTE — Progress Notes (Signed)
 LTM EEG discontinued - no skin breakdown at [facility].

## 2024-09-30 NOTE — Progress Notes (Signed)
 During shift change, pt's daughter voices concerns that the patient is not as alert as this AM. During our assessment, pt is alert to self w/ same confusion throughout shift. RN paged on call MD per pt's daughter request/concern.

## 2024-09-30 NOTE — Progress Notes (Signed)
 Heart Failure Navigator Progress Note  Assessed for Heart & Vascular TOC clinic readiness.  Patient does not meet criteria due to has a scheduled [facility] appointment on 10/04/2024. No HF TOC. .   Navigator will sign off at this time.   Stephane Haddock, BSN, Scientist, Clinical (histocompatibility And Immunogenetics) Only

## 2024-10-01 ENCOUNTER — Inpatient Hospital Stay (HOSPITAL_COMMUNITY): Admitting: Certified Registered"

## 2024-10-01 ENCOUNTER — Inpatient Hospital Stay (HOSPITAL_COMMUNITY)

## 2024-10-01 DIAGNOSIS — R918 Other nonspecific abnormal finding of lung field: Secondary | ICD-10-CM | POA: Diagnosis not present

## 2024-10-01 DIAGNOSIS — J810 Acute pulmonary edema: Secondary | ICD-10-CM | POA: Diagnosis not present

## 2024-10-01 DIAGNOSIS — I4891 Unspecified atrial fibrillation: Secondary | ICD-10-CM | POA: Diagnosis not present

## 2024-10-01 DIAGNOSIS — R509 Fever, unspecified: Secondary | ICD-10-CM | POA: Diagnosis not present

## 2024-10-01 DIAGNOSIS — G9389 Other specified disorders of brain: Secondary | ICD-10-CM | POA: Diagnosis not present

## 2024-10-01 DIAGNOSIS — J811 Chronic pulmonary edema: Secondary | ICD-10-CM | POA: Diagnosis not present

## 2024-10-01 DIAGNOSIS — I517 Cardiomegaly: Secondary | ICD-10-CM | POA: Diagnosis not present

## 2024-10-01 DIAGNOSIS — R29818 Other symptoms and signs involving the nervous system: Secondary | ICD-10-CM | POA: Diagnosis not present

## 2024-10-01 DIAGNOSIS — I4819 Other persistent atrial fibrillation: Secondary | ICD-10-CM | POA: Diagnosis not present

## 2024-10-01 LAB — CBC
HCT: 36.6 % (ref 36.0–46.0)
Hemoglobin: 12.5 g/dL (ref 12.0–15.0)
MCH: 29.3 pg (ref 26.0–34.0)
MCHC: 34.2 g/dL (ref 30.0–36.0)
MCV: 85.7 fL (ref 80.0–100.0)
Platelets: 185 K/uL (ref 150–400)
RBC: 4.27 MIL/uL (ref 3.87–5.11)
RDW: 13.6 % (ref 11.5–15.5)
WBC: 4.9 K/uL (ref 4.0–10.5)
nRBC: 0 % (ref 0.0–0.2)

## 2024-10-01 LAB — COMPREHENSIVE METABOLIC PANEL WITH GFR
ALT: 19 U/L (ref 0–44)
AST: 43 U/L — ABNORMAL HIGH (ref 15–41)
Albumin: 3.3 g/dL — ABNORMAL LOW (ref 3.5–5.0)
Alkaline Phosphatase: 62 U/L (ref 38–126)
Anion gap: 13 (ref 5–15)
BUN: 44 mg/dL — ABNORMAL HIGH (ref 8–23)
CO2: 22 mmol/L (ref 22–32)
Calcium: 7.9 mg/dL — ABNORMAL LOW (ref 8.9–10.3)
Chloride: 94 mmol/L — ABNORMAL LOW (ref 98–111)
Creatinine, Ser: 1.85 mg/dL — ABNORMAL HIGH (ref 0.44–1.00)
GFR, Estimated: 28 mL/min — ABNORMAL LOW (ref >60)
Glucose, Bld: 106 mg/dL — ABNORMAL HIGH (ref 70–99)
Potassium: 4 mmol/L (ref 3.5–5.1)
Sodium: 129 mmol/L — ABNORMAL LOW (ref 135–145)
Total Bilirubin: 0.9 mg/dL (ref 0.0–1.2)
Total Protein: 5.7 g/dL — ABNORMAL LOW (ref 6.5–8.1)

## 2024-10-01 LAB — PROCALCITONIN: Procalcitonin: 0.51 ng/mL

## 2024-10-01 MED ORDER — ALBUMIN HUMAN 25 % IV SOLN: 25.0000 g | Freq: Once | INTRAVENOUS | Status: DC

## 2024-10-01 MED ORDER — SODIUM CHLORIDE 0.9 % IV SOLN: INTRAVENOUS | Status: AC

## 2024-10-01 MED ORDER — SODIUM CHLORIDE 0.9 % IV SOLN
500.0000 mg | INTRAVENOUS | Status: DC
  Administered 2024-10-01 – 2024-10-02 (×2): 500 mg via INTRAVENOUS
  Filled 2024-10-01 (×2): qty 5

## 2024-10-01 MED ORDER — ALBUMIN HUMAN 25 % IV SOLN
25.0000 g | Freq: Once | INTRAVENOUS | Status: AC
  Administered 2024-10-01: 25 g via INTRAVENOUS
  Filled 2024-10-01: qty 100

## 2024-10-01 MED ORDER — SODIUM CHLORIDE 0.9 % IV SOLN
2.0000 g | INTRAVENOUS | Status: DC
  Administered 2024-10-01 – 2024-10-03 (×3): 2 g via INTRAVENOUS
  Filled 2024-10-01 (×3): qty 20

## 2024-10-01 NOTE — Progress Notes (Signed)
 Mobility Specialist Progress Note;   10/01/24 1038  Mobility  Activity Pivoted/transferred to/from [facility]  Level of Assistance Contact guard assist, steadying assist  Assistive Device None  Distance Ambulated (ft) 5 ft  Activity Response Tolerated well  Mobility Referral Yes  Mobility visit 1 Mobility  Mobility Specialist Start Time (ACUTE ONLY) 1038  Mobility Specialist Stop Time (ACUTE ONLY) 1052  Mobility Specialist Time Calculation (min) (ACUTE ONLY) 14 min   Answered pts call bell requesting assistance from [facility]. BM successful, pericare performed. HR in 120s throughout session. Required MinG assistance to stand pivot from [facility] back to bed. No c/o when asked. Pt left in bed with all needs met, alarm on. Daughter present.   Lauraine Erm Mobility Specialist Please contact via SecureChat or [employer] [phone]

## 2024-10-01 NOTE — Plan of Care (Signed)

## 2024-10-01 NOTE — Progress Notes (Signed)
 NEUROLOGY CONSULT FOLLOW UP NOTE   Date of service: October 01, 2024 Patient Name: Destiny Elliott MRN:  [id number] DOB:  [birth year]  Interval Hx/subjective    . Patient alert and oriented. Daughter at bedside states she is back to her baseline.  Her speech continues to improve.  She has less word finding difficulties today.  Patient states that she has had longstanding mild short-term memory difficulties however she is quite functional and independent in activities of daily living at home.   MRI scan of the brain shows 2 tiny punctate right frontal and parietal diffusion positive lesions which are likely silent infarcts in an order to explain her right body for focal seizures.  MRI however shows multiple innumerable tiny microhemorrhages on SWI images and changes of small vessel disease.  This findings are worrisome for cerebral amyloid angiopathy Vitals   Vitals:   10/01/24 0457 10/01/24 0830 10/01/24 1245 10/01/24 1643  BP: 106/82 109/73 112/83 109/73  Pulse: (!) 116 (!) 121 (!) 118 (!) 129  Resp: (!) 21 (!) 21 20 19   Temp:  (!) 97.4 F (36.3 C) (!) 97.3 F (36.3 C) 97.9 F (36.6 C)  TempSrc:  Oral Oral Oral  SpO2: 98% 93% 97% 95%  Weight:      Height:         Body mass index is 20.67 kg/m.  Physical Exam   Constitutional: Appears well-developed and well-nourished pleasant elderly Caucasian lady.  Psych: Affect appropriate to situation.  Cardiovascular: Afib on monitor, controlled rate Respiratory: Effort normal, non-labored breathing. 2L O2.   Neurologic Examination   Neuro: Mental Status: Patient is awake, alert, oriented to person, place, month, year, and situation. Speech today is mostly fluent with only occasional word hesitancy.  Good naming, comprehension and repetition.  Some intermittent slower responses.  Cranial Nerves: II: Visual Fields are full. Pupils are equal, round, and reactive to light.   III,IV, VI: EOMI without ptosis or diploplia.  V: Facial  sensation is symmetric to light touch VII: Facial movement is symmetric.  VIII: hearing is intact to voice X: Uvula elevates symmetrically. No dysarthria.  XI: Shoulder shrug is symmetric. XII: tongue is midline without atrophy or fasciculations.  Motor: Tone is normal. Bulk is normal. 5/5 strength was present in all four extremities.  Sensory: Sensation is symmetric to light touch in the arms and legs. Cerebellar: FNF slow, but intact bilaterally   Medications  Current Facility-Administered Medications:    0.9 %  sodium chloride  infusion, , Intravenous, Continuous, Parcells, Taylor A, PA-C, Last Rate: 25 mL/hr at 10/01/24 1011, New Bag at 10/01/24 1011   acetaminophen  (TYLENOL ) tablet 650 mg, 650 mg, Oral, Q6H PRN, 650 mg at 09/30/24 2043 **OR** acetaminophen  (TYLENOL ) suppository 650 mg, 650 mg, Rectal, Q6H PRN, Howerter, Justin B, DO, 650 mg at 09/30/24 2051   [COMPLETED] amiodarone (NEXTERONE) 1.8 mg/mL load via infusion 150 mg, 150 mg, Intravenous, Once, 150 mg at 09/29/24 1420 **FOLLOWED BY** [EXPIRED] amiodarone (NEXTERONE PREMIX) 360-4.14 MG/200ML-% (1.8 mg/mL) IV infusion, 60 mg/hr, Intravenous, Continuous, Stopped at 09/29/24 2130 **FOLLOWED BY** amiodarone (NEXTERONE PREMIX) 360-4.14 MG/200ML-% (1.8 mg/mL) IV infusion, 30 mg/hr, Intravenous, Continuous, Trudy Birmingham, PA-C, Stopped at 10/01/24 0326   apixaban (ELIQUIS) tablet 5 mg, 5 mg, Oral, BID, Howerter, Justin B, DO, 5 mg at 10/01/24 1056   ARIPiprazole  (ABILIFY ) tablet 2 mg, 2 mg, Oral, Daily, Howerter, Justin B, DO, 2 mg at 10/01/24 1056   azithromycin (ZITHROMAX) 500 mg in sodium chloride  0.9 % 250 mL  IVPB, 500 mg, Intravenous, Q24H, Cindy Garnette POUR, MD, Last Rate: 250 mL/hr at 10/01/24 1725, 500 mg at 10/01/24 1725   cefTRIAXone  (ROCEPHIN ) 2 g in sodium chloride  0.9 % 100 mL IVPB, 2 g, Intravenous, Q24H, Cindy Garnette POUR, MD   levETIRAcetam (KEPPRA) tablet 500 mg, 500 mg, Oral, BID, Lehner, Erin C, NP, 500 mg at 10/01/24  1056   melatonin tablet 3 mg, 3 mg, Oral, QHS PRN, Howerter, Justin B, DO   ondansetron  (ZOFRAN ) injection 4 mg, 4 mg, Intravenous, Q6H PRN, HowerterEva NOVAK, DO  Labs and Diagnostic Imaging   CBC:  Recent Labs  Lab 09/28/24 0445 09/29/24 0540 09/30/24 0630 10/01/24 0558  WBC 5.7   < > 3.9* 4.9  NEUTROABS 4.7  --   --   --   HGB 12.0   < > 13.6 12.5  HCT 36.0   < > 40.3 36.6  MCV 88.2   < > 86.9 85.7  PLT 227   < > 211 185   < > = values in this interval not displayed.    Basic Metabolic Panel:  Lab Results  Component Value Date   NA 129 (L) 10/01/2024   K 4.0 10/01/2024   CO2 22 10/01/2024   GLUCOSE 106 (H) 10/01/2024   BUN 44 (H) 10/01/2024   CREATININE 1.85 (H) 10/01/2024   CALCIUM 7.9 (L) 10/01/2024   GFRNONAA 28 (L) 10/01/2024   GFRAA >60 03/21/2018   Lipid Panel:  Lab Results  Component Value Date   LDLCALC 94 09/30/2024   HgbA1c:  Lab Results  Component Value Date   HGBA1C 5.1 09/30/2024   Urine Drug Screen:     Component Value Date/Time   LABOPIA NONE DETECTED 03/20/2018 1511   COCAINSCRNUR NONE DETECTED 03/20/2018 1511   LABBENZ NONE DETECTED 03/20/2018 1511   AMPHETMU NONE DETECTED 03/20/2018 1511   THCU NONE DETECTED 03/20/2018 1511   LABBARB NONE DETECTED 03/20/2018 1511    Alcohol Level     Component Value Date/Time   ETH <10 03/21/2018 0643   INR  Lab Results  Component Value Date   INR 1.2 09/27/2024   APTT No results found for: APTT AED levels: No results found for: PHENYTOIN, ZONISAMIDE, LAMOTRIGINE, LEVETIRACETA  CT Head without contrast(Personally reviewed): No acute process   CT angio Head and Neck with contrast(Personally reviewed): No LVO  MRI Brain(Personally reviewed): Tiny punctate right parietal and right posterior frontal diffusion positive infarcts.  Multiple tiny microhemorrhages which are widespread on SWI images and significantly progressed compared to previous MRI from 09/04/2017 worrisome for  cerebral amyloid angiopathy  LTM EEG 09/29/2024 2040 to 09/30/2024 0945 :  suggestive of generalized cerebral dysfunction ( encephalopathy). No seizures or epileptiform discharges were seen throughout the recording.   Assessment   Destiny Elliott is a 73 y.o. female with hx of A-fib on Eliquis, hypertension, hyperlipidemia, CHF, anxiety who presented 11/14 d/t shortness of breath, admitted with a diagnosis of A-fib with RVR and new acute CHF (systolic based on echo results), receiving Eliquis and on Amio drip.  Code stroke 11/16 1550 for acute change in mental status and aphasia.  Family at bedside states she was having periods of staring off and unresponsiveness with some shaking of her left shoulder, noted the day before.    Routine EEG negative. Button was pushed d/t LUE twitching 11/16 1925, eeg appears to show triphasics with no clear evolution. Given 1mg  Ativan  and loaded with 1500mg  Keppra, placed on LTM EEG, which is  negative. Overnight, patient had some confusion noted.   She is pending cardioversion for diagnosed Afib with RVTR. Pending MRI brain to specify stroke vs TIA.   Impression: As she is back to baseline, TIA likely (but pending MRI to evaluate for stroke) combined with seizure-like activity due to Afib and hypotension complicated by transition from dilt to atenolol  with subsequent SOB and Acute CHF.    Recommendations   - Continue Keppra 5 mg twice daily for seizures.  Continue anticoagulation with Eliquis for A-fib for stroke prevention for now but patient is clearly at high risk for intracerebral hemorrhage due to her MRI findings suggestive of cerebral amyloid angiopathy.  She needs to be evaluated for consideration for possible Watchman device if she is a candidate.  Continue cardioversion as per cardiology. Discussed with Dr. Patria. Long discussion with the patient her husband and daughter and son-in-law at the bedside and answered questions.   I personally spent a  total of 50 minutes in the care of the patient today including getting/reviewing separately obtained history, performing a medically appropriate exam/evaluation, counseling and educating, placing orders, referring and communicating with other health care professionals, documenting clinical information in the EHR, independently interpreting results, and coordinating care.         Eather Popp, MD Medical Director [facility] Pager: [phone] 10/01/2024 5:29 PM

## 2024-10-01 NOTE — Progress Notes (Signed)
 Rounding Note   Patient Name: Destiny Elliott Date of Encounter: 10/01/2024  [facility] Cardiologist: Lonni LITTIE Nanas, MD   Subjective Denies CP or dyspnea  Scheduled Meds:  apixaban  5 mg Oral BID   ARIPiprazole   2 mg Oral Daily   levETIRAcetam  500 mg Oral BID   Continuous Infusions:  amiodarone 30 mg/hr (10/01/24 0200)   PRN Meds: acetaminophen  **OR** acetaminophen , melatonin, ondansetron  (ZOFRAN ) IV   Vital Signs  Vitals:   10/01/24 0210 10/01/24 0300 10/01/24 0400 10/01/24 0457  BP: 108/76 (!) 78/50 (!) 76/57 106/82  Pulse: (!) 117 (!) 118 (!) 117 (!) 116  Resp:  18 19 (!) 21  Temp:   (!) 97 F (36.1 C)   TempSrc:      SpO2: 98% 98% 98% 98%  Weight:      Height:        Intake/Output Summary (Last 24 hours) at 10/01/2024 0751 Last data filed at 10/01/2024 0200 Gross per 24 hour  Intake 793.6 ml  Output --  Net 793.6 ml      09/30/2024    5:29 AM 09/29/2024    4:47 AM 09/28/2024    2:16 PM  Last 3 Weights  Weight (lbs) 116 lb 11.2 oz 117 lb 11.6 oz 124 lb 9.6 oz  Weight (kg) 52.935 kg 53.4 kg 56.518 kg      Telemetry Atrial fibrillation rate mildly elevated - Personally Reviewed    Physical Exam  GEN: NAD Neck: supple Cardiac: irregular, tachycardic Respiratory: CTA GI: Soft, NT/ND MS: No edema Neuro:  Grossly intact Psych: Normal affect   Labs High Sensitivity Troponin:   Recent Labs  Lab 09/27/24 1923 09/27/24 2139 09/28/24 0053 09/28/24 0445  TROPONINIHS 27* 42* 53* 53*     Chemistry Recent Labs  Lab 09/28/24 0445 09/28/24 1226 09/28/24 1741 09/29/24 0540 09/30/24 0630 10/01/24 0558  NA 136 134*   < > 130* 129* 129*  K 3.0* 5.8*   < > 3.9 3.2* 4.0  CL 101 97*   < > 93* 89* 94*  CO2 24 25   < > 25 26 22   GLUCOSE 107* 120*   < > 118* 97 106*  BUN 13 16   < > 21 28* 44*  CREATININE 0.72 1.01*   < > 0.95 1.39* 1.85*  CALCIUM 7.3* 8.3*   < > 8.1* 7.9* 7.9*  MG 1.7 2.8*  --  2.4 2.2  --   PROT 5.6*   --   --  6.3*  --  5.7*  ALBUMIN 3.2*  --   --  3.3*  --  3.3*  AST 47*  --   --  60*  --  43*  ALT 32  --   --  32  --  19  ALKPHOS 79  --   --  87  --  62  BILITOT 1.9*  --   --  1.7*  --  0.9  GFRNONAA >60 59*   < > >60 40* 28*  ANIONGAP 11 12   < > 12 14 13    < > = values in this interval not displayed.     Hematology Recent Labs  Lab 09/29/24 0540 09/30/24 0630 10/01/24 0558  WBC 4.9 3.9* 4.9  RBC 4.49 4.64 4.27  HGB 13.2 13.6 12.5  HCT 39.0 40.3 36.6  MCV 86.9 86.9 85.7  MCH 29.4 29.3 29.3  MCHC 33.8 33.7 34.2  RDW 14.0 13.7 13.6  PLT 224 211  185   Thyroid   Recent Labs  Lab 09/27/24 1923  TSH 1.129    BNP Recent Labs  Lab 09/28/24 0606  BNP 780.2*     Radiology  Overnight EEG with video Result Date: 09/30/2024 Shelton Arlin KIDD, MD     09/30/2024  2:18 PM Patient Name: Destiny Elliott MRN: [id number] Epilepsy Attending: Arlin KIDD Shelton Referring Physician/Provider: Khaliqdina, Salman, MD Duration: 09/29/2024 2040 to 09/30/2024 1205  Patient history: 73yo F with left shoulder twitching and aphasia. EEG to evaluate for seizure.  Level of alertness: Awake, asleep  AEDs during EEG study: LEV  Technical aspects: This EEG study was done with scalp electrodes positioned according to the 10-20 International system of electrode placement. Electrical activity was reviewed with band pass filter of 1-70Hz , sensitivity of 7 uV/mm, display speed of 87mm/sec with a 60Hz  notched filter applied as appropriate. EEG data were recorded continuously and digitally stored.  Video monitoring was available and reviewed as appropriate.  Description: The posterior dominant rhythm consists of 7.5 Hz activity of moderate voltage (25-35 uV) seen predominantly in posterior head regions, symmetric and reactive to eye opening and eye closing.Sleep was characterized by vertex waves, sleep spindles (12 to 14 Hz), maximal frontocentral region. EEG showed continuous generalized 3 to 6 Hz theta-delta  slowing. Hyperventilation and photic stimulation were not performed.    ABNORMALITY - Continuous slow, generalized  IMPRESSION: This study is suggestive of generalized cerebral dysfunction ( encephalopathy). No seizures or epileptiform discharges were seen throughout the recording.  Arlin KIDD Shelton    EEG adult Result Date: 09/30/2024 Shelton Arlin KIDD, MD     09/30/2024  9:43 AM Patient Name: Destiny Elliott MRN: [id number] Epilepsy Attending: Arlin KIDD Shelton Referring Physician/Provider: Waddell Karna LABOR, NP Date: 09/29/2024 Duration: 25.32 mins Patient history: 73yo F with left shoulder twitching and aphasia. EEG to evaluate for seizure. Level of alertness: Awake AEDs during EEG study: LEV Technical aspects: This EEG study was done with scalp electrodes positioned according to the 10-20 International system of electrode placement. Electrical activity was reviewed with band pass filter of 1-70Hz , sensitivity of 7 uV/mm, display speed of 45mm/sec with a 60Hz  notched filter applied as appropriate. EEG data were recorded continuously and digitally stored.  Video monitoring was available and reviewed as appropriate. Description: EEG showed continuous generalized 3 to 6 Hz theta-delta slowing. Hyperventilation and photic stimulation were not performed.   ABNORMALITY - Continuous slow, generalized IMPRESSION: This study is suggestive of generalized cerebral dysfunction ( encephalopathy). No seizures or epileptiform discharges were seen throughout the recording. Priyanka KIDD Shelton   CT ANGIO HEAD NECK W WO CM (CODE STROKE) Result Date: 09/29/2024 EXAM: CTA HEAD AND NECK WITHOUT AND WITH 09/29/2024 04:34:52 PM TECHNIQUE: CTA of the head and neck was performed without and with the administration of 75 mL of intravenous iohexol (OMNIPAQUE) 350 MG/ML injection. Multiplanar 2D and/or 3D reformatted images are provided for review. Automated exposure control, iterative reconstruction, and/or weight based adjustment of  the mA/kV was utilized to reduce the radiation dose to as low as reasonably achievable. Stenosis of the internal carotid arteries measured using NASCET criteria. COMPARISON: MR head 09/04/2017 CLINICAL HISTORY: Neuro deficit, acute, stroke suspected. FINDINGS: CTA NECK: AORTIC ARCH AND ARCH VESSELS: Atherosclerotic changes are present at the distal aortic arch. No dissection or arterial injury. No significant stenosis of the brachiocephalic or subclavian arteries. CERVICAL CAROTID ARTERIES: No dissection, arterial injury, or hemodynamically significant stenosis by NASCET criteria. CERVICAL VERTEBRAL ARTERIES: A left vertebral  artery is dominant. No dissection, arterial injury, or significant stenosis. LUNGS AND MEDIASTINUM: Unremarkable. SOFT TISSUES: No acute abnormality. BONES: No acute abnormality. CTA HEAD: ANTERIOR CIRCULATION: No significant stenosis of the internal carotid arteries. No significant stenosis of the anterior cerebral arteries. No significant stenosis of the middle cerebral arteries. No aneurysm. POSTERIOR CIRCULATION: No significant stenosis of the posterior cerebral arteries. No significant stenosis of the basilar artery. No significant stenosis of the vertebral arteries. No aneurysm. OTHER: No dural venous sinus thrombosis on this non-dedicated study. IMPRESSION: 1. No large vessel occlusion, hemodynamically significant stenosis, or aneurysm in the head or neck. 2. Atherosclerotic changes at the distal aortic arch. Electronically signed by: Lonni Necessary MD 09/29/2024 04:45 PM EST RP Workstation: [id number]   CT HEAD CODE STROKE WO CONTRAST Result Date: 09/29/2024 EXAM: CT HEAD WITHOUT CONTRAST 09/29/2024 04:22:01 PM TECHNIQUE: CT of the head was performed without the administration of intravenous contrast. Automated exposure control, iterative reconstruction, and/or weight based adjustment of the mA/kV was utilized to reduce the radiation dose to as low as reasonably achievable.  COMPARISON: MR head 09/04/2017. CLINICAL HISTORY: Neuro deficit, acute, stroke suspected. FINDINGS: BRAIN AND VENTRICLES: No acute hemorrhage. An age indeterminate anterior right frontal lobe infarct is present. Ventricular and subcortical white matter hypoattenuation has progressed since the prior study and is moderately advanced for age. Atherosclerotic calcifications are present in the cavernous carotid arteries bilaterally. No hyperdense vessel is present. No hydrocephalus. No extra-axial collection. No mass effect or midline shift. ORBITS: No acute abnormality. SINUSES: No acute abnormality. SOFT TISSUES AND SKULL: No acute soft tissue abnormality. No skull fracture. [place] Stroke Program Early CT Score (ASPECTS) ----- Ganglionic (caudate, ic, lentiform nucleus, insula, M1-m3): 6 Supraganglionic (m4-m6): 3 Total: 9 IMPRESSION: 1. Age-indeterminate anterior right frontal lobe infarct; no CT evidence of acute intracranial hemorrhage or mass effect. 2. Moderately advanced ventricular and subcortical white matter hypoattenuation, progressed since the prior study. 3. Atherosclerotic calcifications in the cavernous carotid arteries bilaterally; no hyperdense vessel. 4. aspects: 9. 5. these results were communicated to Dr. Rosemarie at 4:29 PM on 09/29/2024 by secure text page via the [facility] messaging system. Electronically signed by: Lonni Necessary MD 09/29/2024 04:32 PM EST RP Workstation: [id number]   ECHOCARDIOGRAM COMPLETE Result Date: 09/29/2024    ECHOCARDIOGRAM REPORT   Patient Name:   Lucita KINA SHIFFMAN Date of Exam: 09/29/2024 Medical Rec #:  [id number]         Height:       63.0 in Accession #:    [id number]        Weight:       117.7 lb Date of Birth:  [birth year]         BSA:          1.544 m Patient Age:    73 years          BP:           115/91 mmHg Patient Gender: F                 HR:           127 bpm. Exam Location:  Inpatient Procedure: 2D Echo, Cardiac Doppler and Color Doppler (Both Spectral  and Color            Flow Doppler were utilized during procedure). Indications:    I50.40* Unspecified combined systolic (congestive) and diastolic                 (congestive) heart failure  History:  Patient has prior history of Echocardiogram examinations. CHF,                 Abnormal ECG, Arrythmias:Atrial Fibrillation;                 Signs/Symptoms:Dyspnea and Shortness of Breath.  Sonographer:    Ellouise Mose RDCS Referring Phys: [id number] EVA KATHEE PORE  Sonographer Comments: Technically difficult study due to poor echo windows. Patient appears to have AMS, could not recall her DOB. HR has been high since previous day. IMPRESSIONS  1. Patient tachycardic throughout exam.  2. Left ventricular ejection fraction, by estimation, is 35%. Left ventricular ejection fraction by 2D MOD biplane is 35.6 %. The left ventricle has moderately decreased function. The left ventricle demonstrates global hypokinesis. There is mild concentric left ventricular hypertrophy. Diastology indeterminate due to atrail arrhythmia.  3. Right ventricular systolic function is mildly reduced. The right ventricular size is normal. Tricuspid regurgitation signal is inadequate for assessing PA pressure.  4. There is mild prolapse of the anterior mitral valve leaflet. The mitral valve is abnormal. Mild mitral valve regurgitation. No evidence of mitral stenosis. There is mild prolapse of of the mitral valve.  5. The aortic valve is tricuspid. Aortic valve regurgitation is not visualized. No aortic stenosis is present.  6. The inferior vena cava is dilated in size with >50% respiratory variability, suggesting right atrial pressure of 8 mmHg. Comparison(s): Changes from prior study are noted. Newly reduced LV systolic function. FINDINGS  Left Ventricle: Left ventricular ejection fraction, by estimation, is 35%. Left ventricular ejection fraction by 2D MOD biplane is 35.6 %. The left ventricle has moderately decreased function. The left  ventricle demonstrates global hypokinesis. The left  ventricular internal cavity size was normal in size. There is mild concentric left ventricular hypertrophy. Diastology indeterminate due to atrail arrhythmia. Right Ventricle: The right ventricular size is normal. No increase in right ventricular wall thickness. Right ventricular systolic function is mildly reduced. Tricuspid regurgitation signal is inadequate for assessing PA pressure. Left Atrium: Left atrial size was normal in size. Right Atrium: Right atrial size was normal in size. Pericardium: There is no evidence of pericardial effusion. Mitral Valve: There is mild prolapse of the anterior mitral valve leaflet. The mitral valve is abnormal. There is mild prolapse of of the mitral valve. There is moderate thickening of the anterior and posterior mitral valve leaflet(s). Mild mitral valve regurgitation. No evidence of mitral valve stenosis. Tricuspid Valve: The tricuspid valve is normal in structure. Tricuspid valve regurgitation is trivial. No evidence of tricuspid stenosis. Aortic Valve: The aortic valve is tricuspid. Aortic valve regurgitation is not visualized. No aortic stenosis is present. Pulmonic Valve: The pulmonic valve was normal in structure. Pulmonic valve regurgitation is trivial. No evidence of pulmonic stenosis. Aorta: The aortic root and ascending aorta are structurally normal, with no evidence of dilitation. Venous: The inferior vena cava is dilated in size with greater than 50% respiratory variability, suggesting right atrial pressure of 8 mmHg. IAS/Shunts: No atrial level shunt detected by color flow Doppler.  LEFT VENTRICLE PLAX 2D                        Biplane EF (MOD) LVIDd:         3.80 cm         LV Biplane EF:   Left LVIDs:         2.80 cm  ventricular LV PW:         1.80 cm                          ejection LV IVS:        1.40 cm                          fraction by LVOT diam:     2.30 cm                           2D MOD LV SV:         47                               biplane is LV SV Index:   31                               35.6 %. LVOT Area:     4.15 cm  LV Volumes (MOD) LV vol d, MOD    44.9 ml A2C: LV vol d, MOD    31.9 ml A4C: LV vol s, MOD    25.4 ml A2C: LV vol s, MOD    22.5 ml A4C: LV SV MOD A2C:   19.5 ml LV SV MOD A4C:   31.9 ml LV SV MOD BP:    15.0 ml RIGHT VENTRICLE             IVC RV S prime:     10.80 cm/s  IVC diam: 2.20 cm TAPSE (M-mode): 1.6 cm LEFT ATRIUM             Index        RIGHT ATRIUM           Index LA diam:        2.60 cm 1.68 cm/m   RA Area:     16.80 cm LA Vol (A2C):   22.7 ml 14.71 ml/m  RA Volume:   44.90 ml  29.09 ml/m LA Vol (A4C):   51.8 ml 33.56 ml/m LA Biplane Vol: 35.2 ml 22.80 ml/m  AORTIC VALVE LVOT Vmax:   79.00 cm/s LVOT Vmean:  53.300 cm/s LVOT VTI:    0.114 m  AORTA Ao Root diam: 3.20 cm Ao Asc diam:  3.40 cm MITRAL VALVE MV Area (PHT): 4.80 cm    SHUNTS MV Decel Time: 158 msec    Systemic VTI:  0.11 m MV E velocity: 87.50 cm/s  Systemic Diam: 2.30 cm Georganna Archer Electronically signed by Georganna Archer Signature Date/Time: 09/29/2024/2:32:53 PM    Final     Patient Profile   Chanon Loney is a 73 y.o. female with a history of paroxysmal atrial fibrillation/ flutter on Eliquis, external SVT, hypertension, hyperlipidemia, and bipolar disorder who is being seen 09/28/2024 for the evaluation of atrial flutter with RVR.  Echocardiogram this admission shows ejection fraction 35%, mild left ventricular hypertrophy, mild RV dysfunction, mild mitral regurgitation.  LV function newly reduced compared to echocardiogram March 2025.  Assessment & Plan  1 atrial fibrillation/flutter-patient remains in atrial fibrillation this morning heart rate mildly elevated.  She had an episode of hypotension last evening which has resolved.  Would resume IV amiodarone and continue apixaban.  Neurology would like to delay cardioversion until  results of MRI of brain are  available.  This is planned for today.  Will therefore plan to proceed with cardioversion tomorrow if MRI is okay.  Once sinus is reestablished we will consider resuming beta-blocker based on follow-up blood pressure.  Ultimately would plan to continue IV amiodarone for approximately 8 weeks and then discontinue given her young age.  Hopefully atrial arrhythmias were precipitated by influenza.  Can also consider addition of flecainide  or referral for ablation in the future.  2 cardiomyopathy-etiology unclear.  Question tachycardia mediated.  Resume amiodarone for rate control and proceed with cardioversion tomorrow if MRI okay as outlined above.  Will continue to hold ARB and beta-blocker for now.  Will resume beta-blocker later if blood pressure allows and add Entresto.   3 acute systolic congestive heart failure-patient is not markedly volume overloaded on examination.  Continue to hold Lasix.  4 hyponatremia-Lasix on hold.  Follow.  5 influenza A-per primary service.  Recurrent fever last night.  6 question seizures-neurology is following.  Felt presentation was more consistent with seizures than CVA.  MRI pending.  7 hypertension-patient has been hypotensive at times.  ARB and beta-blocker on hold.  For questions or updates, please contact [facility]   Signed, Redell Shallow, MD  10/01/2024, 7:51 AM

## 2024-10-01 NOTE — Progress Notes (Signed)
 Physician notified about events of the evening including mentation changes throughout the night, fever, and most recently BP changes. Amio was stopped and albumin ordered. Will continue to monitor and update as needed.

## 2024-10-01 NOTE — Progress Notes (Signed)
 SBP is sustaining in the 70s. IV amiodarone was held and albumin administered.

## 2024-10-01 NOTE — Progress Notes (Signed)
 Progress Note   Patient: Destiny Elliott FMW:[id number] DOB: 1951-03-16 DOA: 09/27/2024     4 DOS: the patient was seen and examined on 10/01/2024   Brief hospital course: 73 y.o. female with medical history significant for paroxysmal atrial fibrillation recently started on Eliquis, send hypertension, chronic diastolic heart failure, who is admitted to [facility] on 09/27/2024 with atrial fibrillation with RVR after presenting from home to [facility] ED complaining of shortness of breath.    In the context of recent diagnosis of atrial fibrillation, the patient has been following with cardiology in atrial fibrillation clinic, on anticoag with plans for outpt cardioversion 11/17. Pt developed progressive shortness of breath over the last 2 days, associated with some orthopnea, new onset nonproductive cough   In the ED, pt was noted to have afib in RVR with evidence of vol overload.   Assessment and Plan: #) Atrial fibrillation with RVR:  -Cardiology consulted -lopressor on hold given soft BP -cont to correct lytes as needed, goal K >4 and Mg>2 -cont eliquis for now -Cardiology recs for IV amiodarone with eliquis -Tentative plans for DCCV pending for now   #) Acute on chronic diastolic systolic heart failure:  -Elevated BNP in excess of 780 with evidence of vol overload -Pt was given IV lasix, now on hold given intermittent hypotension -2d echo notable for EF 35% -Eventual plan for beta blocker and entresto when renal function improves   #) Essential Hypertension:  -was on avapro 150mg  and lopressor 50mg  TID, now on hold given intermittent hypotension recently -BP stable    #) Depression:  -continue Abilify    #) Flu A + -Low grade temp at time of presentation -Flu A pos, covid neg, RSV neg -Per family, symptoms began almost 72hrs  PTA -Will hold off on antiviral and cont supportive care, pt's daughter at bedside agrees -Chest xray clear, however pt is febrile today with  elevated procal of 0.51 -will start empiric abx to cover bacterial pna  #) Acute stroke -See rapid response on 11/16 -Initial concerns for seizures noted.  -Continued on keppra per Neuro -MRI brain 11/18 reviewed with Neurology. Concern for acute/subacute CVA involving R parietal lobe, postcentral gyrus. Possible infarct in high R frontal lobe -Also concern for cerebral amyloid angiopathy on MR, thus making pt higher risk for ICH -Will f/u with Neurology regarding recs for anticoag. Continued on eliquis for now     Subjective: More alert and conversant this AM. Pt feels thirsty and hungry, asking for something to drink  Physical Exam: Vitals:   10/01/24 0457 10/01/24 0830 10/01/24 1245 10/01/24 1643  BP: 106/82 109/73 112/83 109/73  Pulse: (!) 116 (!) 121 (!) 118 (!) 129  Resp: (!) 21 (!) 21 20 19   Temp:  (!) 97.4 F (36.3 C) (!) 97.3 F (36.3 C) 97.9 F (36.6 C)  TempSrc:  Oral Oral Oral  SpO2: 98% 93% 97% 95%  Weight:      Height:       General exam: Conversant, in no acute distress Respiratory system: normal chest rise, clear, no audible wheezing Cardiovascular system: tachycardic, s1-s2 Gastrointestinal system: Nondistended, nontender, pos BS Central nervous system: No seizures, no tremors Extremities: No cyanosis, no joint deformities Skin: No rashes, no pallor Psychiatry: Affect normal // mood seems normal  Data Reviewed:  Labs reviewed: Na 129, K 4.0, Cr 1.85, WBC 4.9, Hgb 12.5, Plts 185  Family Communication: Pt in room, family at bedside  Disposition: Status is: Inpatient Remains inpatient appropriate because:  severity of illness  Planned Discharge Destination: Home    Author: Garnette Pelt, MD 10/01/2024 4:54 PM  For on call review [email or web address].

## 2024-10-02 ENCOUNTER — Inpatient Hospital Stay (HOSPITAL_COMMUNITY)

## 2024-10-02 ENCOUNTER — Other Ambulatory Visit: Payer: Self-pay

## 2024-10-02 ENCOUNTER — Encounter (HOSPITAL_COMMUNITY)
Admission: EM | Disposition: A | Discharge status: Home Health | Payer: Self-pay | Source: Home / Self Care | Attending: Internal Medicine

## 2024-10-02 ENCOUNTER — Encounter (HOSPITAL_COMMUNITY): Payer: Self-pay | Admitting: Internal Medicine

## 2024-10-02 DIAGNOSIS — I340 Nonrheumatic mitral (valve) insufficiency: Secondary | ICD-10-CM | POA: Diagnosis not present

## 2024-10-02 DIAGNOSIS — Z87891 Personal history of nicotine dependence: Secondary | ICD-10-CM

## 2024-10-02 DIAGNOSIS — I4891 Unspecified atrial fibrillation: Secondary | ICD-10-CM | POA: Diagnosis not present

## 2024-10-02 DIAGNOSIS — I110 Hypertensive heart disease with heart failure: Secondary | ICD-10-CM | POA: Diagnosis not present

## 2024-10-02 DIAGNOSIS — I5033 Acute on chronic diastolic (congestive) heart failure: Secondary | ICD-10-CM | POA: Diagnosis not present

## 2024-10-02 DIAGNOSIS — G459 Transient cerebral ischemic attack, unspecified: Secondary | ICD-10-CM | POA: Diagnosis not present

## 2024-10-02 DIAGNOSIS — I959 Hypotension, unspecified: Secondary | ICD-10-CM | POA: Diagnosis not present

## 2024-10-02 DIAGNOSIS — R569 Unspecified convulsions: Secondary | ICD-10-CM | POA: Diagnosis not present

## 2024-10-02 DIAGNOSIS — N179 Acute kidney failure, unspecified: Secondary | ICD-10-CM

## 2024-10-02 HISTORY — PX: CARDIOVERSION: EP1203

## 2024-10-02 HISTORY — PX: TRANSESOPHAGEAL ECHOCARDIOGRAM (CATH LAB): EP1270

## 2024-10-02 LAB — COMPREHENSIVE METABOLIC PANEL WITH GFR
ALT: 21 U/L (ref 0–44)
AST: 41 U/L (ref 15–41)
Albumin: 3.6 g/dL (ref 3.5–5.0)
Alkaline Phosphatase: 59 U/L (ref 38–126)
Anion gap: 15 (ref 5–15)
BUN: 28 mg/dL — ABNORMAL HIGH (ref 8–23)
CO2: 24 mmol/L (ref 22–32)
Calcium: 8.4 mg/dL — ABNORMAL LOW (ref 8.9–10.3)
Chloride: 94 mmol/L — ABNORMAL LOW (ref 98–111)
Creatinine, Ser: 1.21 mg/dL — ABNORMAL HIGH (ref 0.44–1.00)
GFR, Estimated: 47 mL/min — ABNORMAL LOW (ref >60)
Glucose, Bld: 79 mg/dL (ref 70–99)
Potassium: 4 mmol/L (ref 3.5–5.1)
Sodium: 133 mmol/L — ABNORMAL LOW (ref 135–145)
Total Bilirubin: 0.7 mg/dL (ref 0.0–1.2)
Total Protein: 6.6 g/dL (ref 6.5–8.1)

## 2024-10-02 LAB — CBC
HCT: 37.1 % (ref 36.0–46.0)
Hemoglobin: 12.6 g/dL (ref 12.0–15.0)
MCH: 29.2 pg (ref 26.0–34.0)
MCHC: 34 g/dL (ref 30.0–36.0)
MCV: 85.9 fL (ref 80.0–100.0)
Platelets: 195 K/uL (ref 150–400)
RBC: 4.32 MIL/uL (ref 3.87–5.11)
RDW: 13.4 % (ref 11.5–15.5)
WBC: 2.8 K/uL — ABNORMAL LOW (ref 4.0–10.5)
nRBC: 0 % (ref 0.0–0.2)

## 2024-10-02 LAB — ECHO TEE

## 2024-10-02 LAB — MAGNESIUM: Magnesium: 2.3 mg/dL (ref 1.7–2.4)

## 2024-10-02 SURGERY — CARDIOVERSION (CATH LAB)
Anesthesia: General

## 2024-10-02 MED ORDER — BENZONATATE 100 MG PO CAPS
100.0000 mg | ORAL_CAPSULE | Freq: Two times a day (BID) | ORAL | Status: DC | PRN
  Administered 2024-10-02: 100 mg via ORAL
  Filled 2024-10-02: qty 1

## 2024-10-02 MED ORDER — BUTAMBEN-TETRACAINE-BENZOCAINE 2-2-14 % EX AERO
INHALATION_SPRAY | CUTANEOUS | Status: AC
  Filled 2024-10-02: qty 20

## 2024-10-02 MED ORDER — SODIUM CHLORIDE 0.9 % IV SOLN: INTRAVENOUS | Status: DC

## 2024-10-02 MED ORDER — SODIUM CHLORIDE 0.9 % IV SOLN: INTRAVENOUS | Status: DC | PRN

## 2024-10-02 MED ORDER — METOPROLOL TARTRATE 12.5 MG HALF TABLET
12.5000 mg | ORAL_TABLET | Freq: Two times a day (BID) | ORAL | Status: DC
  Administered 2024-10-02 (×2): 12.5 mg via ORAL
  Filled 2024-10-02 (×2): qty 1

## 2024-10-02 MED ORDER — GUAIFENESIN 100 MG/5ML PO LIQD
5.0000 mL | ORAL | Status: DC | PRN
  Administered 2024-10-02: 5 mL via ORAL
  Filled 2024-10-02: qty 10

## 2024-10-02 MED ORDER — PHENYLEPHRINE HCL (PRESSORS) 10 MG/ML IV SOLN
INTRAVENOUS | Status: DC | PRN
  Administered 2024-10-02: 80 ug via INTRAVENOUS

## 2024-10-02 MED ORDER — PROPOFOL 10 MG/ML IV BOLUS
INTRAVENOUS | Status: DC | PRN
  Administered 2024-10-02: 80 ug/kg/min via INTRAVENOUS
  Administered 2024-10-02 (×2): 20 mg via INTRAVENOUS

## 2024-10-02 NOTE — Assessment & Plan Note (Addendum)
 Keppra  500 mg p.o. twice daily, this has been prescribed on discharge

## 2024-10-02 NOTE — Assessment & Plan Note (Addendum)
 On no prior CKD Recheck BMP in a.m., continue to monitor Serum creatinine today is 1.0, eGFR 59, improving

## 2024-10-02 NOTE — Interval H&P Note (Signed)
 History and Physical Interval Note:  10/02/2024 12:29 PM  Destiny Elliott  has presented today for surgery, with the diagnosis of afib/aflutter.  The various methods of treatment have been discussed with the patient and family. After consideration of risks, benefits and other options for treatment, the patient has consented to  Procedure(s): TRANSESOPHAGEAL ECHOCARDIOGRAM (N/A) CARDIOVERSION (N/A) as a surgical intervention.  The patient's history has been reviewed, patient examined, no change in status, stable for surgery.  I have reviewed the patient's chart and labs.  Questions were answered to the patient's satisfaction.     Maude Emmer

## 2024-10-02 NOTE — Progress Notes (Signed)
 Rounding Note   Patient Name: Destiny Elliott Date of Encounter: 10/02/2024  [facility] Cardiologist: Lonni LITTIE Nanas, MD   Subjective No CP or dyspnea  Scheduled Meds:  apixaban   5 mg Oral BID   ARIPiprazole   2 mg Oral Daily   levETIRAcetam   500 mg Oral BID   Continuous Infusions:  amiodarone  Stopped (10/01/24 0326)   azithromycin  Stopped (10/01/24 1828)   cefTRIAXone  (ROCEPHIN )  IV Stopped (10/01/24 1906)   PRN Meds: acetaminophen  **OR** acetaminophen , melatonin, ondansetron  (ZOFRAN ) IV   Vital Signs  Vitals:   10/01/24 1643 10/01/24 2005 10/01/24 2335 10/02/24 0405  BP: 109/73 106/74 107/76 101/69  Pulse: (!) 129 (!) 118 (!) 124 (!) 125  Resp: 19 20 20 18   Temp: 97.9 F (36.6 C) 98 F (36.7 C) 98 F (36.7 C) 97.9 F (36.6 C)  TempSrc: Oral Oral Oral Oral  SpO2: 95% 97% 97% 96%  Weight:    52.1 kg  Height:        Intake/Output Summary (Last 24 hours) at 10/02/2024 0745 Last data filed at 10/02/2024 0520 Gross per 24 hour  Intake 808.77 ml  Output 675 ml  Net 133.77 ml      10/02/2024    4:05 AM 09/30/2024    5:29 AM 09/29/2024    4:47 AM  Last 3 Weights  Weight (lbs) 114 lb 13.8 oz 116 lb 11.2 oz 117 lb 11.6 oz  Weight (kg) 52.1 kg 52.935 kg 53.4 kg      Telemetry Atrial fibrillation rate mildly elevated - Personally Reviewed    Physical Exam  GEN: NAD, WD Neck: supple, no JVD Cardiac: irregular, tachycardic, no rub Respiratory: CTA; no wheeze GI: Soft, NT/ND, no masses MS: No edema Neuro:  No focal findings Psych: Normal affect   Labs High Sensitivity Troponin:   Recent Labs  Lab 09/27/24 1923 09/27/24 2139 09/28/24 0053 09/28/24 0445  TROPONINIHS 27* 42* 53* 53*     Chemistry Recent Labs  Lab 09/28/24 0445 09/28/24 1226 09/28/24 1741 09/29/24 0540 09/30/24 0630 10/01/24 0558  NA 136 134*   < > 130* 129* 129*  K 3.0* 5.8*   < > 3.9 3.2* 4.0  CL 101 97*   < > 93* 89* 94*  CO2 24 25   < > 25 26 22    GLUCOSE 107* 120*   < > 118* 97 106*  BUN 13 16   < > 21 28* 44*  CREATININE 0.72 1.01*   < > 0.95 1.39* 1.85*  CALCIUM 7.3* 8.3*   < > 8.1* 7.9* 7.9*  MG 1.7 2.8*  --  2.4 2.2  --   PROT 5.6*  --   --  6.3*  --  5.7*  ALBUMIN  3.2*  --   --  3.3*  --  3.3*  AST 47*  --   --  60*  --  43*  ALT 32  --   --  32  --  19  ALKPHOS 79  --   --  87  --  62  BILITOT 1.9*  --   --  1.7*  --  0.9  GFRNONAA >60 59*   < > >60 40* 28*  ANIONGAP 11 12   < > 12 14 13    < > = values in this interval not displayed.     Hematology Recent Labs  Lab 09/30/24 0630 10/01/24 0558 10/02/24 0430  WBC 3.9* 4.9 2.8*  RBC 4.64 4.27 4.32  HGB 13.6  12.5 12.6  HCT 40.3 36.6 37.1  MCV 86.9 85.7 85.9  MCH 29.3 29.3 29.2  MCHC 33.7 34.2 34.0  RDW 13.7 13.6 13.4  PLT 211 185 195   Thyroid   Recent Labs  Lab 09/27/24 1923  TSH 1.129    BNP Recent Labs  Lab 09/28/24 0606  BNP 780.2*     Radiology  DG CHEST PORT 1 VIEW Result Date: 10/01/2024 EXAM: 1 VIEW(S) XRAY OF THE CHEST 10/01/2024 08:37:00 AM COMPARISON: 09/27/2024 CLINICAL HISTORY: Fever FINDINGS: LUNGS AND PLEURA: Previous interstitial accentuation has mostly resolved. Linear subsegmental atelectasis or scarring at the left lung base. No pleural effusion. No pneumothorax. HEART AND MEDIASTINUM: Mild cardiomegaly, stable. Aortic arch atherosclerosis. BONES AND SOFT TISSUES: No acute osseous abnormality. IMPRESSION: 1. Linear subsegmental atelectasis or scarring at the left lung base. 2. Mild cardiomegaly, stable. Previous interstitial edema has resolved. 3. Aortic arch atherosclerosis. Electronically signed by: Ryan Salvage MD 10/01/2024 12:43 PM EST RP Workstation: [id number]   MR BRAIN WO CONTRAST Result Date: 10/01/2024 EXAM: MRI BRAIN WITHOUT CONTRAST 10/01/2024 09:45:08 AM TECHNIQUE: Multiplanar multisequence MRI of the head/brain was performed without the administration of intravenous contrast. COMPARISON: MR Head 09/04/2017 and CT  head 09/29/2024. CLINICAL HISTORY: Neuro deficit, acute, stroke suspected. FINDINGS: BRAIN AND VENTRICLES: Punctate focus of restricted diffusion involving the right parietal lobe, post central gyrus, without appreciable associated T2/FLAIR hyperintensity. Questionable additional punctate focus of restricted diffusion involving the right frontal lobe (series 2, image 43). Innumerable small and punctate foci of susceptibility within the supratentorial and infratentorial parenchyma in keeping with the sequela of chronic microhemorrhages, significantly increased since prior exam, and relatively sparing the deep gray nuclei. Chronic regions of encephalomalacia, gliosis, and hemosiderin deposition involving the right frontal and left lateral temporal lobes, likely also reflecting the sequelae of prior hemorrhages, new since 2018. Overall similar moderate-to-severe scattered periventricular and subcortical white matter T2 hyperintensities, which are nonspecific but most commonly reflect the sequelae of chronic microvascular ischemia. No mass. No midline shift. No hydrocephalus. The sella is unremarkable. Normal flow voids. ORBITS: No acute abnormality. SINUSES AND MASTOIDS: Trace bilateral mastoid effusions. BONES AND SOFT TISSUES: Normal marrow signal. No acute soft tissue abnormality. IMPRESSION: 1. Acute or subacute punctate infarction involving the right parietal lobe, postcentral gyrus region. Questionable additional punctate infarct involving the high right frontal lobe. 2. No acute intracranial hemorrhage or mass effect. 3. Since 09/04/2017, significantly progressed innumerable foci of parenchymal susceptibility, with relative sparing of the deep gray nuclei. Interval chronic lobar hemorrhages involving the right frontal and left temporal lobes. 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 4. Overall similar moderate-to-severe subcortical and periventricular white matter signal  abnormality, most commonly reflecting the sequela of chronic microangiopathic ischemic disease. Electronically signed by: prentice bybordi 10/01/2024 10:46 AM EST RP Workstation: [id number]   Overnight EEG with video Result Date: 09/30/2024 Shelton Arlin KIDD, MD     09/30/2024  2:18 PM Patient Name: Destiny Elliott MRN: [id number] Epilepsy Attending: Arlin KIDD Shelton Referring Physician/Provider: Vanessa Robert, MD Duration: 09/29/2024 2040 to 09/30/2024 1205  Patient history: 73yo F with left shoulder twitching and aphasia. EEG to evaluate for seizure.  Level of alertness: Awake, asleep  AEDs during EEG study: LEV  Technical aspects: This EEG study was done with scalp electrodes positioned according to the 10-20 International system of electrode placement. Electrical activity was reviewed with band pass filter of 1-70Hz , sensitivity of 7 uV/mm, display speed of 51mm/sec with a 60Hz  notched filter applied as appropriate.  EEG data were recorded continuously and digitally stored.  Video monitoring was available and reviewed as appropriate.  Description: The posterior dominant rhythm consists of 7.5 Hz activity of moderate voltage (25-35 uV) seen predominantly in posterior head regions, symmetric and reactive to eye opening and eye closing.Sleep was characterized by vertex waves, sleep spindles (12 to 14 Hz), maximal frontocentral region. EEG showed continuous generalized 3 to 6 Hz theta-delta slowing. Hyperventilation and photic stimulation were not performed.    ABNORMALITY - Continuous slow, generalized  IMPRESSION: This study is suggestive of generalized cerebral dysfunction ( encephalopathy). No seizures or epileptiform discharges were seen throughout the recording.  Arlin MALVA Krebs    EEG adult Result Date: 09/30/2024 Krebs Arlin MALVA, MD     09/30/2024  9:43 AM Patient Name: Destiny Elliott MRN: [id number] Epilepsy Attending: Arlin MALVA Krebs Referring Physician/Provider: Waddell Karna LABOR, NP Date:  09/29/2024 Duration: 25.32 mins Patient history: 73yo F with left shoulder twitching and aphasia. EEG to evaluate for seizure. Level of alertness: Awake AEDs during EEG study: LEV Technical aspects: This EEG study was done with scalp electrodes positioned according to the 10-20 International system of electrode placement. Electrical activity was reviewed with band pass filter of 1-70Hz , sensitivity of 7 uV/mm, display speed of 28mm/sec with a 60Hz  notched filter applied as appropriate. EEG data were recorded continuously and digitally stored.  Video monitoring was available and reviewed as appropriate. Description: EEG showed continuous generalized 3 to 6 Hz theta-delta slowing. Hyperventilation and photic stimulation were not performed.   ABNORMALITY - Continuous slow, generalized IMPRESSION: This study is suggestive of generalized cerebral dysfunction ( encephalopathy). No seizures or epileptiform discharges were seen throughout the recording. Arlin MALVA Krebs    Patient Profile   Destiny Elliott is a 73 y.o. female with a history of paroxysmal atrial fibrillation/ flutter on Eliquis , external SVT, hypertension, hyperlipidemia, and bipolar disorder who is being seen 09/28/2024 for the evaluation of atrial flutter with RVR.  Echocardiogram this admission shows ejection fraction 35%, mild left ventricular hypertrophy, mild RV dysfunction, mild mitral regurgitation.  LV function newly reduced compared to echocardiogram March 2025.  Assessment & Plan  1 atrial fibrillation/flutter-patient remains in atrial fibrillation this morning heart rate elevated.  Her amiodarone  was discontinued due to hypotension but will resume.  Add metoprolol  12.5 mg twice daily.  Recent MRI shows CVA and amyloid angiopathy with increased risk of intracranial hemorrhage.  Neurology previously wanted to delay cardioversion but heart rate remains elevated.  I reviewed with Dr. Rosemarie; he feels we can now proceed with TEE guided  cardioversion (she has not missed anticoagulation but given recent CVA on MRI would like to make sure there is no left atrial appendage thrombus prior to cardioversion).  Continue apixaban .  She will be seen as an outpatient for consideration of Watchman device given increased risk of intracranial hemorrhage based on amyloid angiopathy.  Ultimately would plan to continue amiodarone  for approximately 8 weeks and then discontinue given her young age.  Hopefully atrial arrhythmias were precipitated by influenza.  Can also consider addition of flecainide  or referral for ablation in the future.  2 cardiomyopathy-etiology unclear.  Question tachycardia mediated.  Resume amiodarone  for rate control; add metoprolol  12.5 mg BID; proceed with TEE/cardioversion (reviewed with neurology).  Will continue to hold ARB and beta-blocker for now.  Will resume beta-blocker later if blood pressure allows and add Entresto .   3 acute systolic congestive heart failure-patient is not markedly volume overloaded on examination.  Continue  to hold Lasix.  4 hyponatremia-Lasix on hold.  Follow.  5 influenza A-per primary service.    6 hypertension-patient had previously been hypotensive at times.  ARB and beta-blocker on hold.  For questions or updates, please contact [facility]   Signed, Redell Shallow, MD  10/02/2024, 7:45 AM

## 2024-10-02 NOTE — Progress Notes (Signed)
 SLP Cancellation Note  Patient Details Name: Aliene Tamura MRN: [id number] DOB: 1951-11-03   Cancelled treatment:       Reason Eval/Treat Not Completed: Other (comment) (NPO for TEE today. SLP will follow up when ready)   Norleen IVAR Blase, MA, CCC-SLP Speech Therapy

## 2024-10-02 NOTE — Assessment & Plan Note (Addendum)
 Cardiology consulted, patient has outpatient cardiology appointment for Watchman evaluation 11/19: Patient is status post TEE and cardioversion with cardiology Continue telemetry 11/20: Patient is in sinus rhythm today.  On amiodarone  drip and this will be discontinued today.  Will transition to amiodarone  200 mg p.o. twice daily today 11/21: Patient will be discharged with home amiodarone  200 mg daily, metoprolol  succinate 25 mg daily, Entresto  24-26 mg p.o. twice daily, spironolactone  12.5 mg daily.

## 2024-10-02 NOTE — Assessment & Plan Note (Signed)
 Metoprolol tartrate 12.5 mg p.o. twice daily

## 2024-10-02 NOTE — Progress Notes (Signed)
 NEUROLOGY CONSULT FOLLOW UP NOTE   Date of service: October 02, 2024 Patient Name: Destiny Elliott MRN:  [id number] DOB:  09-17-1951  Interval Hx/subjective    . Patient is sitting up in bed.. Daughter at bedside states she is back to her baseline.  Her speech is now at baseline.   Cardiology planning cardiac ablation today Vitals   Vitals:   10/02/24 1313 10/02/24 1323 10/02/24 1333 10/02/24 1358  BP: (!) 91/52 102/66 107/64 107/60  Pulse: 61  60 64  Resp: 18 20 19  (!) 22  Temp: 98.1 F (36.7 C)   97.9 F (36.6 C)  TempSrc: Tympanic   Oral  SpO2: 96%  96% 95%  Weight:      Height:         Body mass index is 20.35 kg/m.  Physical Exam   Constitutional: Appears well-developed and well-nourished pleasant elderly Caucasian lady.  Psych: Affect appropriate to situation.  Cardiovascular: Afib on monitor, controlled rate Respiratory: Effort normal, non-labored breathing. 2L O2.   Neurologic Examination   Neuro: Mental Status: Patient is awake, alert, oriented to person, place, month, year, and situation. Speech today is mostly fluent with only occasional word hesitancy.  Good naming, comprehension and repetition.  Some intermittent slower responses.  Cranial Nerves: II: Visual Fields are full. Pupils are equal, round, and reactive to light.   III,IV, VI: EOMI without ptosis or diploplia.  V: Facial sensation is symmetric to light touch VII: Facial movement is symmetric.  VIII: hearing is intact to voice X: Uvula elevates symmetrically. No dysarthria.  XI: Shoulder shrug is symmetric. XII: tongue is midline without atrophy or fasciculations.  Motor: Tone is normal. Bulk is normal. 5/5 strength was present in all four extremities.  Sensory: Sensation is symmetric to light touch in the arms and legs. Cerebellar: FNF slow, but intact bilaterally   Medications  Current Facility-Administered Medications:    acetaminophen  (TYLENOL ) tablet 650 mg, 650 mg, Oral, Q6H  PRN, 650 mg at 09/30/24 2043 **OR** acetaminophen  (TYLENOL ) suppository 650 mg, 650 mg, Rectal, Q6H PRN, Nishan, Peter C, MD, 650 mg at 09/30/24 2051   [COMPLETED] amiodarone  (NEXTERONE ) 1.8 mg/mL load via infusion 150 mg, 150 mg, Intravenous, Once, 150 mg at 09/29/24 1420 **FOLLOWED BY** [EXPIRED] amiodarone  (NEXTERONE  PREMIX) 360-4.14 MG/200ML-% (1.8 mg/mL) IV infusion, 60 mg/hr, Intravenous, Continuous, Stopped at 09/29/24 2130 **FOLLOWED BY** amiodarone  (NEXTERONE  PREMIX) 360-4.14 MG/200ML-% (1.8 mg/mL) IV infusion, 30 mg/hr, Intravenous, Continuous, Delford Maude BROCKS, MD, Last Rate: 16.67 mL/hr at 10/02/24 0754, 30 mg/hr at 10/02/24 0754   apixaban  (ELIQUIS ) tablet 5 mg, 5 mg, Oral, BID, Nishan, Peter C, MD, 5 mg at 10/02/24 1005   ARIPiprazole  (ABILIFY ) tablet 2 mg, 2 mg, Oral, Daily, Nishan, Peter C, MD, 2 mg at 10/02/24 1140   azithromycin  (ZITHROMAX ) 500 mg in sodium chloride  0.9 % 250 mL IVPB, 500 mg, Intravenous, Q24H, Nishan, Peter C, MD, Stopped at 10/01/24 1828   butamben -tetracaine -benzocaine  (CETACAINE ) 12-16-12 % spray, , , ,    cefTRIAXone  (ROCEPHIN ) 2 g in sodium chloride  0.9 % 100 mL IVPB, 2 g, Intravenous, Q24H, Nishan, Peter C, MD, Stopped at 10/01/24 1906   levETIRAcetam  (KEPPRA ) tablet 500 mg, 500 mg, Oral, BID, Nishan, Peter C, MD, 500 mg at 10/02/24 1140   melatonin tablet 3 mg, 3 mg, Oral, QHS PRN, Nishan, Peter C, MD   metoprolol  tartrate (LOPRESSOR ) tablet 12.5 mg, 12.5 mg, Oral, BID, Nishan, Peter C, MD, 12.5 mg at 10/02/24 1004   ondansetron  (ZOFRAN ) injection  4 mg, 4 mg, Intravenous, Q6H PRN, Nishan, Peter C, MD  Labs and Diagnostic Imaging   CBC:  Recent Labs  Lab 09/28/24 0445 09/29/24 0540 10/01/24 0558 10/02/24 0430  WBC 5.7   < > 4.9 2.8*  NEUTROABS 4.7  --   --   --   HGB 12.0   < > 12.5 12.6  HCT 36.0   < > 36.6 37.1  MCV 88.2   < > 85.7 85.9  PLT 227   < > 185 195   < > = values in this interval not displayed.    Basic Metabolic Panel:  Lab Results   Component Value Date   NA 133 (L) 10/02/2024   K 4.0 10/02/2024   CO2 24 10/02/2024   GLUCOSE 79 10/02/2024   BUN 28 (H) 10/02/2024   CREATININE 1.21 (H) 10/02/2024   CALCIUM 8.4 (L) 10/02/2024   GFRNONAA 47 (L) 10/02/2024   GFRAA >60 03/21/2018   Lipid Panel:  Lab Results  Component Value Date   LDLCALC 94 09/30/2024   HgbA1c:  Lab Results  Component Value Date   HGBA1C 5.1 09/30/2024   Urine Drug Screen:     Component Value Date/Time   LABOPIA NONE DETECTED 03/20/2018 1511   COCAINSCRNUR NONE DETECTED 03/20/2018 1511   LABBENZ NONE DETECTED 03/20/2018 1511   AMPHETMU NONE DETECTED 03/20/2018 1511   THCU NONE DETECTED 03/20/2018 1511   LABBARB NONE DETECTED 03/20/2018 1511    Alcohol Level     Component Value Date/Time   ETH <10 03/21/2018 0643   INR  Lab Results  Component Value Date   INR 1.2 09/27/2024   APTT No results found for: APTT AED levels: No results found for: PHENYTOIN, ZONISAMIDE, LAMOTRIGINE, LEVETIRACETA  CT Head without contrast(Personally reviewed): No acute process   CT angio Head and Neck with contrast(Personally reviewed): No LVO  MRI Brain(Personally reviewed): Tiny punctate right parietal and right posterior frontal diffusion positive infarcts.  Multiple tiny microhemorrhages which are widespread on SWI images and significantly progressed compared to previous MRI from 09/04/2017 worrisome for cerebral amyloid angiopathy  LTM EEG 09/29/2024 2040 to 09/30/2024 0945 :  suggestive of generalized cerebral dysfunction ( encephalopathy). No seizures or epileptiform discharges were seen throughout the recording.   Assessment   Destiny Elliott is a 73 y.o. female with hx of A-fib on Eliquis, hypertension, hyperlipidemia, CHF, anxiety who presented 11/14 d/t shortness of breath, admitted with a diagnosis of A-fib with RVR and new acute CHF (systolic based on echo results), receiving Eliquis and on Amio drip.  Code stroke 11/16  1550 for acute change in mental status and aphasia.  Family at bedside states she was having periods of staring off and unresponsiveness with some shaking of her left shoulder, noted the day before.    Routine EEG negative. Button was pushed d/t LUE twitching 11/16 1925, eeg appears to show triphasics with no clear evolution. Given 1mg  Ativan  and loaded with 1500mg  Keppra, placed on LTM EEG, which is negative. Overnight, patient had some confusion noted.   She is pending cardioversion for diagnosed Afib with RVTR. Pending MRI brain to specify stroke vs TIA.   Impression: As she is back to baseline, TIA likely (but pending MRI to evaluate for stroke) combined with seizure-like activity due to Afib and hypotension complicated by transition from dilt to atenolol  with subsequent SOB and Acute CHF.    Recommendations   - Continue Keppra 500 mg twice daily for seizures.  Continue anticoagulation  with Eliquis  for A-fib for stroke prevention for now but patient is clearly at high risk for intracerebral hemorrhage due to her MRI findings suggestive of cerebral amyloid angiopathy.  She needs to be evaluated for consideration for possible Watchman device if she is a candidate as an outpatient after discharge..  Continue cardioversion as per cardiology. Discussed with Dr. Pietro cardiology Long discussion with the patient her husband and daughter and son-in-law at the bedside and answered questions.   I personally spent a total of 35 minutes in the care of the patient today including getting/reviewing separately obtained history, performing a medically appropriate exam/evaluation, counseling and educating, placing orders, referring and communicating with other health care professionals, documenting clinical information in the EHR, independently interpreting results, and coordinating care.     Stroke team will sign off.  Follow-up as an outpatient stroke clinic in 2 months.   Eather Popp, MD Medical  Director [facility] Pager: [phone] 10/02/2024 3:25 PM

## 2024-10-02 NOTE — Assessment & Plan Note (Signed)
 Abilify 2 mg daily

## 2024-10-02 NOTE — Anesthesia Postprocedure Evaluation (Signed)
 Anesthesia Post Note  Patient: Destiny Elliott  Procedure(s) Performed: TRANSESOPHAGEAL ECHOCARDIOGRAM CARDIOVERSION     Patient location during evaluation: Endoscopy Anesthesia Type: MAC Level of consciousness: awake Pain management: pain level controlled Vital Signs Assessment: post-procedure vital signs reviewed and stable Respiratory status: spontaneous breathing Cardiovascular status: blood pressure returned to baseline Postop Assessment: no apparent nausea or vomiting Anesthetic complications: no   No notable events documented.  Last Vitals:  Vitals:   10/02/24 1204 10/02/24 1313  BP: (!) 124/93 (!) 91/52  Pulse: (!) 129 61  Resp: (!) 27 18  Temp: 36.8 C 36.7 C  SpO2: 94% 96%    Last Pain:  Vitals:   10/02/24 1313  TempSrc: Tympanic  PainSc: Asleep                 Lauraine KATHEE [place]

## 2024-10-02 NOTE — Assessment & Plan Note (Signed)
Abilify

## 2024-10-02 NOTE — Anesthesia Preprocedure Evaluation (Addendum)
 Anesthesia Evaluation  Patient identified by MRN, date of birth, ID band Patient awake    Reviewed: Allergy & Precautions, NPO status , Patient's Chart, lab work & pertinent test results  History of Anesthesia Complications Negative for: history of anesthetic complications  Airway Mallampati: II  TM Distance: >3 FB Neck ROM: Full    Dental  (+) Teeth Intact, Dental Advisory Given   Pulmonary former smoker   breath sounds clear to auscultation       Cardiovascular hypertension, Pt. on medications and Pt. on home beta blockers +CHF  + dysrhythmias  Rhythm:Irregular Rate:Normal  TTE (2025): 1. Patient tachycardic throughout exam.   2. Left ventricular ejection fraction, by estimation, is 35%. Left  ventricular ejection fraction by 2D MOD biplane is 35.6 %. The left  ventricle has moderately decreased function. The left ventricle  demonstrates global hypokinesis. There is mild  concentric left ventricular hypertrophy. Diastology indeterminate due to  atrail arrhythmia.   3. Right ventricular systolic function is mildly reduced. The right  ventricular size is normal. Tricuspid regurgitation signal is inadequate  for assessing PA pressure.   4. There is mild prolapse of the anterior mitral valve leaflet. The  mitral valve is abnormal. Mild mitral valve regurgitation. No evidence of  mitral stenosis. There is mild prolapse of of the mitral valve.   5. The aortic valve is tricuspid. Aortic valve regurgitation is not  visualized. No aortic stenosis is present.   6. The inferior vena cava is dilated in size with >50% respiratory  variability, suggesting right atrial pressure of 8 mmHg.      Neuro/Psych Seizures -, Well Controlled,  PSYCHIATRIC DISORDERS Anxiety Depression Bipolar Disorder   CVA    GI/Hepatic negative GI ROS, Neg liver ROS,,,  Endo/Other  negative endocrine ROS    Renal/GU negative Renal ROS      Musculoskeletal   Abdominal   Peds  Hematology   Anesthesia Other Findings   Reproductive/Obstetrics                              Anesthesia Physical Anesthesia Plan  ASA: 3  Anesthesia Plan: MAC   Post-op Pain Management:    Induction: Intravenous  PONV Risk Score and Plan: 2 and Treatment may vary due to age or medical condition and Propofol  infusion  Airway Management Planned: Natural Airway and Nasal Cannula  Additional Equipment:   Intra-op Plan:   Post-operative Plan:   Informed Consent:      Dental advisory given and Consent reviewed with POA  Plan Discussed with: CRNA  Anesthesia Plan Comments:          Anesthesia Quick Evaluation

## 2024-10-02 NOTE — CV Procedure (Signed)
 TEE/DCC: Anesthesia:  Propofol On Rx eliquis but given recent CVA Dr Pietro wanted TEE done  EF 35-40% in rapid afib No LAA thrombus Moderate MR Tri leaflet AV with multiple filamentous strands coming off aortic side of right coronary cusp and L/Non commissure   ? Lambl's excrescences Normal RV Normal TV/PV No ASD/PFO  DCC x 1 200J while on amiodarone iv Converted from afib rate 120 bpm to NSR rate 60 with frequent PAC;s  No immediate neurologic sequelae  Maude Emmer MD [facility]

## 2024-10-02 NOTE — Progress Notes (Addendum)
 PROGRESS NOTE  Destiny Elliott  FMW:[id number] DOB: 1950/11/25 DOA: 09/27/2024 PCP: Verena Mems, MD   73 y.o. female with medical history significant for paroxysmal atrial fibrillation recently started on Eliquis, send hypertension, chronic diastolic heart failure, who is admitted to [facility] on 09/27/2024 with atrial fibrillation with RVR after presenting from home to [facility] ED complaining of shortness of breath.    In the context of recent diagnosis of atrial fibrillation, the patient has been following with cardiology in atrial fibrillation clinic, on anticoag with plans for outpt cardioversion 11/17. Pt developed progressive shortness of breath over the last 2 days, associated with some orthopnea, new onset nonproductive cough   In the ED, pt was noted to have afib in RVR with evidence of vol overload.   11/14: Patient admitted to hospitalist service for atrial fibrillation with RVR.  Cardiology consulted.  11/16: Code stroke was called.  Initial concerns for seizure.  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  Possible infarct in the right frontal lobe.  Neurology consulted.  11/19: TEE ordered and pending.  Cardioversion can be done potentially after results of TEE are negative for thrombus.  Assessment & Plan:   Principal Problem:   Atrial fibrillation with RVR (HCC) Active Problems:   AKI (acute kidney injury)   Bipolar disorder (HCC)   Essential hypertension   Acute on chronic diastolic CHF (congestive heart failure) (HCC)   Elevated troponin   Acute hypoxic respiratory failure (HCC)   Depression   Seizure (HCC)   Assessment and Plan:  * Atrial fibrillation with RVR (HCC) Pending TEE today Cardiology consulted Tentative cardioversion pending TEE result Continue telemetry  AKI (acute kidney injury) On no prior CKD Improving Recheck BMP in a.m., continue to monitor  Seizure (HCC) Keppra 500 mg p.o.  twice daily  Depression Abilify  2 mg daily  Essential hypertension Metoprolol tartrate 12.5 mg p.o. twice daily  Bipolar disorder (HCC) Abilify   DVT prophylaxis: Apixaban Code Status: Full code Family Communication: Daughter, Sonny was at bedside and updated. Disposition Plan:  Level of care: Progressive  Consultants:  Cardiology, neurology  Procedures:  None at this time.  Pending cardioversion if no thrombus in atrial appendage.  Antimicrobials: Azithromycin 500 mg IV daily, ceftriaxone  2 g IV daily  Subjective:  At bedside, patient able to tell me her first and last name, age, location, current calendar year.  Patient was unsure as to what was going on.  I further discussed that patient had a stroke and was found to have A-fib and started on rate control medication.  We are also doing a heart ultrasound to ensure that there are no clots before cardioversion.  Daughter was at bedside and updated.  Objective: Vitals:   10/02/24 1004 10/02/24 1017 10/02/24 1204 10/02/24 1313  BP: (!) 123/107 125/89 (!) 124/93 (!) 91/52  Pulse: (!) 140 (!) 133 (!) 129 61  Resp:  (!) 27 (!) 27 18  Temp:   98.2 F (36.8 C) 98.1 F (36.7 C)  TempSrc:   Temporal Tympanic  SpO2:  93% 94% 96%  Weight:      Height:        Intake/Output Summary (Last 24 hours) at 10/02/2024 1324 Last data filed at 10/02/2024 0520 Gross per 24 hour  Intake 784.77 ml  Output 675 ml  Net 109.77 ml   Filed Weights   09/29/24 0447 09/30/24 0529 10/02/24 0405  Weight: 53.4 kg 52.9 kg 52.1 kg  Examination:  General exam: Appears calm and comfortable  Respiratory system: Clear to auscultation. Respiratory effort normal. Cardiovascular system: S1 & S2 heard, RRR. No JVD, murmurs, rubs, gallops or clicks. No pedal edema. Gastrointestinal system: Abdomen is nondistended, soft and nontender. No organomegaly or masses felt. Normal bowel sounds heard. Central nervous system: Alert and oriented. No focal  neurological deficits. Extremities: Symmetric 5 x 5 power. Skin: No rashes, lesions or ulcers Psychiatry: Judgement and insight appear normal. Mood & affect appropriate.   Data Reviewed: I have personally reviewed following labs and imaging studies  CBC: Recent Labs  Lab 09/28/24 0445 09/29/24 0540 09/30/24 0630 10/01/24 0558 10/02/24 0430  WBC 5.7 4.9 3.9* 4.9 2.8*  NEUTROABS 4.7  --   --   --   --   HGB 12.0 13.2 13.6 12.5 12.6  HCT 36.0 39.0 40.3 36.6 37.1  MCV 88.2 86.9 86.9 85.7 85.9  PLT 227 224 211 185 195   Basic Metabolic Panel: Recent Labs  Lab 09/28/24 0445 09/28/24 1226 09/28/24 1741 09/29/24 0540 09/30/24 0630 10/01/24 0558 10/02/24 0430  NA 136 134* 132* 130* 129* 129* 133*  K 3.0* 5.8* 5.1 3.9 3.2* 4.0 4.0  CL 101 97* 95* 93* 89* 94* 94*  CO2 24 25 22 25 26 22 24   GLUCOSE 107* 120* 118* 118* 97 106* 79  BUN 13 16 15 21  28* 44* 28*  CREATININE 0.72 1.01* 0.92 0.95 1.39* 1.85* 1.21*  CALCIUM 7.3* 8.3* 8.5* 8.1* 7.9* 7.9* 8.4*  MG 1.7 2.8*  --  2.4 2.2  --  2.3  PHOS 2.4*  --   --   --   --   --   --    GFR: Estimated Creatinine Clearance: 34.1 mL/min (A) (by C-G formula based on SCr of 1.21 mg/dL (H)). Liver Function Tests: Recent Labs  Lab 09/28/24 0445 09/29/24 0540 10/01/24 0558 10/02/24 0430  AST 47* 60* 43* 41  ALT 32 32 19 21  ALKPHOS 79 87 62 59  BILITOT 1.9* 1.7* 0.9 0.7  PROT 5.6* 6.3* 5.7* 6.6  ALBUMIN 3.2* 3.3* 3.3* 3.6   Coagulation Profile: Recent Labs  Lab 09/27/24 1923  INR 1.2   HbA1C: Recent Labs    09/30/24 0630  HGBA1C 5.1   CBG: Recent Labs  Lab 09/29/24 1554  GLUCAP 171*   Lipid Profile: Recent Labs    09/30/24 0630  CHOL 133  HDL 24*  LDLCALC 94  TRIG 75  CHOLHDL 5.5   Sepsis Labs: Recent Labs  Lab 09/28/24 0630 10/01/24 0821  PROCALCITON <0.10 0.51   Recent Results (from the past 240 hours)  Resp panel by RT-PCR (RSV, Flu A&B, Covid) Anterior Nasal Swab     Status: Abnormal   Collection  Time: 09/28/24  1:24 PM   Specimen: Anterior Nasal Swab  Result Value Ref Range Status   SARS Coronavirus 2 by RT PCR NEGATIVE NEGATIVE Final   Influenza A by PCR POSITIVE (A) NEGATIVE Final   Influenza B by PCR NEGATIVE NEGATIVE Final    Comment: (NOTE) The Xpert Xpress SARS-CoV-2/FLU/RSV plus assay is intended as an aid in the diagnosis of influenza from Nasopharyngeal swab specimens and should not be used as a sole basis for treatment. Nasal washings and aspirates are unacceptable for Xpert Xpress SARS-CoV-2/FLU/RSV testing.  Fact Sheet for Patients: bloggercourse.com  Fact Sheet for Healthcare Providers: seriousbroker.it  This test is not yet approved or cleared by the [place]  FDA and has been authorized for detection  and/or diagnosis of SARS-CoV-2 by FDA under an Emergency Use Authorization (EUA). This EUA will remain in effect (meaning this test can be used) for the duration of the COVID-19 declaration under Section 564(b)(1) of the Act, 21 U.S.C. section 360bbb-3(b)(1), unless the authorization is terminated or revoked.     Resp Syncytial Virus by PCR NEGATIVE NEGATIVE Final    Comment: (NOTE) Fact Sheet for Patients: bloggercourse.com  Fact Sheet for Healthcare Providers: seriousbroker.it  This test is not yet approved or cleared by the [place]  FDA and has been authorized for detection and/or diagnosis of SARS-CoV-2 by FDA under an Emergency Use Authorization (EUA). This EUA will remain in effect (meaning this test can be used) for the duration of the COVID-19 declaration under Section 564(b)(1) of the Act, 21 U.S.C. section 360bbb-3(b)(1), unless the authorization is terminated or revoked.  Performed at [facility], [address] [place], [place] [address]     Radiology Studies: EP STUDY Result Date: 10/02/2024 See surgical note for  result.  DG CHEST PORT 1 VIEW Result Date: 10/01/2024 EXAM: 1 VIEW(S) XRAY OF THE CHEST 10/01/2024 08:37:00 AM COMPARISON: 09/27/2024 CLINICAL HISTORY: Fever FINDINGS: LUNGS AND PLEURA: Previous interstitial accentuation has mostly resolved. Linear subsegmental atelectasis or scarring at the left lung base. No pleural effusion. No pneumothorax. HEART AND MEDIASTINUM: Mild cardiomegaly, stable. Aortic arch atherosclerosis. BONES AND SOFT TISSUES: No acute osseous abnormality. IMPRESSION: 1. Linear subsegmental atelectasis or scarring at the left lung base. 2. Mild cardiomegaly, stable. Previous interstitial edema has resolved. 3. Aortic arch atherosclerosis. Electronically signed by: Ryan Salvage MD 10/01/2024 12:43 PM EST RP Workstation: [id number]   MR BRAIN WO CONTRAST Result Date: 10/01/2024 EXAM: MRI BRAIN WITHOUT CONTRAST 10/01/2024 09:45:08 AM TECHNIQUE: Multiplanar multisequence MRI of the head/brain was performed without the administration of intravenous contrast. COMPARISON: MR Head 09/04/2017 and CT head 09/29/2024. CLINICAL HISTORY: Neuro deficit, acute, stroke suspected. FINDINGS: BRAIN AND VENTRICLES: Punctate focus of restricted diffusion involving the right parietal lobe, post central gyrus, without appreciable associated T2/FLAIR hyperintensity. Questionable additional punctate focus of restricted diffusion involving the right frontal lobe (series 2, image 43). Innumerable small and punctate foci of susceptibility within the supratentorial and infratentorial parenchyma in keeping with the sequela of chronic microhemorrhages, significantly increased since prior exam, and relatively sparing the deep gray nuclei. Chronic regions of encephalomalacia, gliosis, and hemosiderin deposition involving the right frontal and left lateral temporal lobes, likely also reflecting the sequelae of prior hemorrhages, new since 2018. Overall similar moderate-to-severe scattered periventricular and  subcortical white matter T2 hyperintensities, which are nonspecific but most commonly reflect the sequelae of chronic microvascular ischemia. No mass. No midline shift. No hydrocephalus. The sella is unremarkable. Normal flow voids. ORBITS: No acute abnormality. SINUSES AND MASTOIDS: Trace bilateral mastoid effusions. BONES AND SOFT TISSUES: Normal marrow signal. No acute soft tissue abnormality. IMPRESSION: 1. Acute or subacute punctate infarction involving the right parietal lobe, postcentral gyrus region. Questionable additional punctate infarct involving the high right frontal lobe. 2. No acute intracranial hemorrhage or mass effect. 3. Since 09/04/2017, significantly progressed innumerable foci of parenchymal susceptibility, with relative sparing of the deep gray nuclei. Interval chronic lobar hemorrhages involving the right frontal and left temporal lobes. 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 4. Overall similar moderate-to-severe subcortical and periventricular white matter signal abnormality, most commonly reflecting the sequela of chronic microangiopathic ischemic disease. Electronically signed by: prentice spade 10/01/2024 10:46 AM EST RP Workstation: [id number]   Scheduled Meds:  [MAR Hold]  apixaban  5 mg Oral BID   [MAR Hold] ARIPiprazole   2 mg Oral Daily   butamben-tetracaine-benzocaine       [MAR Hold] levETIRAcetam  500 mg Oral BID   [MAR Hold] metoprolol tartrate  12.5 mg Oral BID   Continuous Infusions:  sodium chloride  20 mL/hr at 10/02/24 1016   amiodarone 30 mg/hr (10/02/24 0754)   [MAR Hold] azithromycin Stopped (10/01/24 1828)   [MAR Hold] cefTRIAXone  (ROCEPHIN )  IV Stopped (10/01/24 1906)    LOS: 5 days   Time spent: 50 minutes  Dr. Sherre [facility] If 7PM-7AM, please contact night-coverage 10/02/2024, 1:24 PM

## 2024-10-02 NOTE — Transfer of Care (Signed)
 Immediate Anesthesia Transfer of Care Note  Patient: Destiny Elliott  Procedure(s) Performed: TRANSESOPHAGEAL ECHOCARDIOGRAM CARDIOVERSION  Patient Location: PACU  Anesthesia Type:MAC  Level of Consciousness: drowsy  Airway & Oxygen Therapy: Patient Spontanous Breathing  Post-op Assessment: Report given to RN  Post vital signs: stable  Last Vitals:  Vitals Value Taken Time  BP    Temp    Pulse    Resp    SpO2      Last Pain:  Vitals:   10/02/24 1204  TempSrc: Temporal  PainSc:       Patients Stated Pain Goal: 0 (09/28/24 1416)  Complications: No notable events documented.

## 2024-10-03 ENCOUNTER — Encounter (HOSPITAL_COMMUNITY): Payer: Self-pay | Admitting: Cardiovascular Disease

## 2024-10-03 ENCOUNTER — Telehealth (HOSPITAL_COMMUNITY): Payer: Self-pay | Admitting: Pharmacy Technician

## 2024-10-03 ENCOUNTER — Other Ambulatory Visit (HOSPITAL_COMMUNITY): Payer: Self-pay

## 2024-10-03 DIAGNOSIS — E876 Hypokalemia: Secondary | ICD-10-CM | POA: Insufficient documentation

## 2024-10-03 DIAGNOSIS — E871 Hypo-osmolality and hyponatremia: Secondary | ICD-10-CM | POA: Insufficient documentation

## 2024-10-03 DIAGNOSIS — R059 Cough, unspecified: Secondary | ICD-10-CM

## 2024-10-03 DIAGNOSIS — T7840XA Allergy, unspecified, initial encounter: Secondary | ICD-10-CM

## 2024-10-03 DIAGNOSIS — I4891 Unspecified atrial fibrillation: Secondary | ICD-10-CM | POA: Diagnosis not present

## 2024-10-03 LAB — BASIC METABOLIC PANEL WITH GFR
Anion gap: 10 (ref 5–15)
BUN: 17 mg/dL (ref 8–23)
CO2: 27 mmol/L (ref 22–32)
Calcium: 7.9 mg/dL — ABNORMAL LOW (ref 8.9–10.3)
Chloride: 96 mmol/L — ABNORMAL LOW (ref 98–111)
Creatinine, Ser: 1 mg/dL (ref 0.44–1.00)
GFR, Estimated: 59 mL/min — ABNORMAL LOW (ref >60)
Glucose, Bld: 86 mg/dL (ref 70–99)
Potassium: 4.3 mmol/L (ref 3.5–5.1)
Sodium: 133 mmol/L — ABNORMAL LOW (ref 135–145)

## 2024-10-03 LAB — CBC
HCT: 33.8 % — ABNORMAL LOW (ref 36.0–46.0)
Hemoglobin: 11.1 g/dL — ABNORMAL LOW (ref 12.0–15.0)
MCH: 28.8 pg (ref 26.0–34.0)
MCHC: 32.8 g/dL (ref 30.0–36.0)
MCV: 87.8 fL (ref 80.0–100.0)
Platelets: 170 K/uL (ref 150–400)
RBC: 3.85 MIL/uL — ABNORMAL LOW (ref 3.87–5.11)
RDW: 13.4 % (ref 11.5–15.5)
WBC: 2.8 K/uL — ABNORMAL LOW (ref 4.0–10.5)
nRBC: 0 % (ref 0.0–0.2)

## 2024-10-03 LAB — SURGICAL PCR SCREEN
MRSA, PCR: NEGATIVE
Staphylococcus aureus: NEGATIVE

## 2024-10-03 MED ORDER — MUPIROCIN 2 % EX OINT
1.0000 | TOPICAL_OINTMENT | Freq: Two times a day (BID) | CUTANEOUS | Status: DC
  Administered 2024-10-03 – 2024-10-04 (×2): 1 via NASAL
  Filled 2024-10-03: qty 22

## 2024-10-03 MED ORDER — METOPROLOL SUCCINATE ER 25 MG PO TB24
25.0000 mg | ORAL_TABLET | Freq: Every day | ORAL | Status: DC
  Administered 2024-10-03 – 2024-10-04 (×2): 25 mg via ORAL
  Filled 2024-10-03 (×2): qty 1

## 2024-10-03 MED ORDER — GUAIFENESIN 100 MG/5ML PO LIQD
5.0000 mL | ORAL | Status: DC | PRN
  Administered 2024-10-03 (×2): 5 mL via ORAL
  Filled 2024-10-03 (×2): qty 10

## 2024-10-03 MED ORDER — AZITHROMYCIN 250 MG PO TABS
500.0000 mg | ORAL_TABLET | Freq: Every day | ORAL | Status: DC
  Administered 2024-10-03 – 2024-10-04 (×2): 500 mg via ORAL
  Filled 2024-10-03 (×2): qty 2

## 2024-10-03 MED ORDER — AMIODARONE HCL 200 MG PO TABS
200.0000 mg | ORAL_TABLET | Freq: Two times a day (BID) | ORAL | Status: DC
  Administered 2024-10-03 (×2): 200 mg via ORAL
  Filled 2024-10-03 (×2): qty 1

## 2024-10-03 MED ORDER — DIPHENHYDRAMINE-ZINC ACETATE 2-0.1 % EX CREA
TOPICAL_CREAM | Freq: Two times a day (BID) | CUTANEOUS | Status: DC
  Filled 2024-10-03: qty 28

## 2024-10-03 MED ORDER — HYDROCOD POLI-CHLORPHE POLI ER 10-8 MG/5ML PO SUER
5.0000 mL | Freq: Every evening | ORAL | Status: DC | PRN
  Filled 2024-10-03: qty 5

## 2024-10-03 MED ORDER — SACUBITRIL-VALSARTAN 24-26 MG PO TABS
1.0000 | ORAL_TABLET | Freq: Two times a day (BID) | ORAL | Status: DC
  Administered 2024-10-03 – 2024-10-04 (×3): 1 via ORAL
  Filled 2024-10-03 (×3): qty 1

## 2024-10-03 MED ORDER — DIPHENHYDRAMINE-ZINC ACETATE 2-0.1 % EX CREA
TOPICAL_CREAM | Freq: Two times a day (BID) | CUTANEOUS | Status: DC
Start: 2024-10-03 — End: 2024-10-04
  Filled 2024-10-03: qty 28

## 2024-10-03 MED ORDER — SPIRONOLACTONE 12.5 MG HALF TABLET
12.5000 mg | ORAL_TABLET | Freq: Every day | ORAL | Status: DC
  Administered 2024-10-03 – 2024-10-04 (×2): 12.5 mg via ORAL
  Filled 2024-10-03 (×2): qty 1

## 2024-10-03 NOTE — Plan of Care (Signed)
   Problem: Activity: Goal: Risk for activity intolerance will decrease Outcome: Progressing

## 2024-10-03 NOTE — Progress Notes (Addendum)
 PROGRESS NOTE  Destiny Elliott  FMW:[id number] DOB: [birth year] DOA: 09/27/2024 PCP: Verena Mems, MD   73 y.o. female with medical history significant for paroxysmal atrial fibrillation recently started on Eliquis, send hypertension, chronic diastolic heart failure, who is admitted to [facility] on 09/27/2024 with atrial fibrillation with RVR after presenting from home to [facility] ED complaining of shortness of breath.    In the context of recent diagnosis of atrial fibrillation, the patient has been following with cardiology in atrial fibrillation clinic, on anticoag with plans for outpt cardioversion 11/17. Pt developed progressive shortness of breath over the last 2 days, associated with some orthopnea, new onset nonproductive cough   In the ED, pt was noted to have afib in RVR with evidence of vol overload.   Significant events based on progress notes: 11/14: Patient admitted to hospitalist service for atrial fibrillation with RVR.  Cardiology consulted. 11/16: Code stroke was called.  Initial concerns for seizure.  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  Possible infarct in the right frontal lobe.  Neurology consulted. 11/19: TEE ordered and pending.  Cardioversion can be done potentially after results of TEE are negative for thrombus. Neurology signed off.  11/20: Amiodarone gtt. was discontinued today.  Amiodarone 200 mg p.o. twice daily initiated.  PT, OT consulted.  Tussionex nightly as needed ordered.   Assessment & Plan:   Principal Problem:   Atrial fibrillation with RVR (HCC) Active Problems:   AKI (acute kidney injury)   Elevated troponin   Acute hypoxic respiratory failure (HCC)   Seizure (HCC)   Depression   Cough   Rash due to allergy   Bipolar disorder (HCC)   Essential hypertension   Acute on chronic diastolic CHF (congestive heart failure) (HCC)   Hyponatremia   Hypokalemia   Assessment and  Plan:  * Atrial fibrillation with RVR [facility]) Cardiology consulted, patient has outpatient cardiology appointment for Watchman evaluation 11/19: Patient is status post TEE and cardioversion with cardiology Continue telemetry 11/20: Patient is in sinus rhythm today.  On amiodarone drip and this will be discontinued today.  Will transition to amiodarone 200 mg p.o. twice daily today  AKI (acute kidney injury) On no prior CKD Recheck BMP in a.m., continue to monitor Serum creatinine today is 1.0, eGFR 59, improving  Seizure (HCC) Keppra 500 mg p.o. twice daily  Acute hypoxic respiratory failure (HCC) 11/19: I assumed care and patient is no longer requiring supplemental oxygen 11/19: Resolved Suspect secondary to left lung base pneumonia Continue with ceftriaxone  2 g IV daily, day 3 Azithromycin 500 mg, day 3  Elevated troponin Stable Patient denies chest pain currently I suspect this is secondary to demand ischemia in setting of atrial fibrillation with RVR  Rash due to allergy New; suspect contact dermatitis, from cardioversion patch Diphenhydramine -zinc acetate, topical twice daily, 2 days ordered    Cough Patient and daughter reports that Tessalon Perles did not help patient with the cough. Patient reports she did like the syrup, Robitussin however would like something stronger at night so that she can sleep and rest Discontinue Tessalon Perles Continue guaifenesin 5 mm p.o. every 4 hours as needed to loosen phlegm and cough during the day Tussionex 5 mL p.o. nightly as needed for cough at night, 2 days ordered  Depression Abilify  2 mg daily  Hypokalemia Resolved starting on 11/18  Hyponatremia Stable  Essential hypertension Metoprolol tartrate 12.5 mg p.o. twice daily  Bipolar disorder (HCC)  Abilify   DVT prophylaxis: Apixaban Code Status: Full code Family Communication: Updated daughter, Sonny at bedside Disposition Plan: Pending clinical course, anticipate  discharge on 11/21.  PT, OT sure patient has appropriate strength for home discharge Level of care: Progressive  Consultants:  Cardiology, neurology  Procedures:  11/19 DCCV, TEE  Antimicrobials: Azithromycin 500 mg IV daily, ceftriaxone  2 g IV daily  Subjective:  At bedside, patient was able to tell me her first and last name, age, location, current calendar year.  She does not appear to be in acute distress.  Patient states she feels much better today.  She reports that she has been getting up and walking to and from the restroom in her room without any difficulty.  Daughter at bedside, understands that patient's amiodarone drip will be discontinued today and that she will be initiated on amiodarone pills.  I have ordered PT, OT to evaluate patient today to ensure that patient has appropriate strength for a home discharge  Objective: Vitals:   10/03/24 0755 10/03/24 1039 10/03/24 1200 10/03/24 1211  BP: 133/73 138/82 (!) 163/80 130/79  Pulse: 69 80  (!) 59  Resp: 14   20  Temp: 98 F (36.7 C)  97.6 F (36.4 C)   TempSrc: Oral  Oral   SpO2: 95%   97%  Weight:      Height:       No intake or output data in the 24 hours ending 10/03/24 1545 Filed Weights   09/30/24 0529 10/02/24 0405 10/03/24 0424  Weight: 52.9 kg 52.1 kg 55.7 kg   Examination:  General exam: Appears calm and comfortable  Respiratory system: Clear to auscultation. Respiratory effort normal. Cardiovascular system: S1 & S2 heard, RRR. No JVD, murmurs, rubs, gallops or clicks. No pedal edema. Gastrointestinal system: Abdomen is nondistended, soft and nontender. No organomegaly or masses felt. Normal bowel sounds heard. Central nervous system: Alert and oriented. No focal neurological deficits. Extremities: Symmetric 5 x 5 power. Skin: + rash on left back. Psychiatry: Judgement and insight appear normal. Mood & affect appropriate.   Data Reviewed: I have personally reviewed following labs and imaging  studies  CBC: Recent Labs  Lab 09/28/24 0445 09/29/24 0540 09/30/24 0630 10/01/24 0558 10/02/24 0430 10/03/24 0401  WBC 5.7 4.9 3.9* 4.9 2.8* 2.8*  NEUTROABS 4.7  --   --   --   --   --   HGB 12.0 13.2 13.6 12.5 12.6 11.1*  HCT 36.0 39.0 40.3 36.6 37.1 33.8*  MCV 88.2 86.9 86.9 85.7 85.9 87.8  PLT 227 224 211 185 195 170   Basic Metabolic Panel: Recent Labs  Lab 09/28/24 0445 09/28/24 1226 09/28/24 1741 09/29/24 0540 09/30/24 0630 10/01/24 0558 10/02/24 0430 10/03/24 0401  NA 136 134*   < > 130* 129* 129* 133* 133*  K 3.0* 5.8*   < > 3.9 3.2* 4.0 4.0 4.3  CL 101 97*   < > 93* 89* 94* 94* 96*  CO2 24 25   < > 25 26 22 24 27   GLUCOSE 107* 120*   < > 118* 97 106* 79 86  BUN 13 16   < > 21 28* 44* 28* 17  CREATININE 0.72 1.01*   < > 0.95 1.39* 1.85* 1.21* 1.00  CALCIUM 7.3* 8.3*   < > 8.1* 7.9* 7.9* 8.4* 7.9*  MG 1.7 2.8*  --  2.4 2.2  --  2.3  --   PHOS 2.4*  --   --   --   --   --   --   --    < > =  values in this interval not displayed.   GFR: Estimated Creatinine Clearance: 41.4 mL/min (by C-G formula based on SCr of 1 mg/dL).  Liver Function Tests: Recent Labs  Lab 09/28/24 0445 09/29/24 0540 10/01/24 0558 10/02/24 0430  AST 47* 60* 43* 41  ALT 32 32 19 21  ALKPHOS 79 87 62 59  BILITOT 1.9* 1.7* 0.9 0.7  PROT 5.6* 6.3* 5.7* 6.6  ALBUMIN 3.2* 3.3* 3.3* 3.6   Coagulation Profile: Recent Labs  Lab 09/27/24 1923  INR 1.2   CBG: Recent Labs  Lab 09/29/24 1554  GLUCAP 171*   Sepsis Labs: Recent Labs  Lab 09/28/24 0630 10/01/24 0821  PROCALCITON <0.10 0.51   Recent Results (from the past 240 hours)  Resp panel by RT-PCR (RSV, Flu A&B, Covid) Anterior Nasal Swab     Status: Abnormal   Collection Time: 09/28/24  1:24 PM   Specimen: Anterior Nasal Swab  Result Value Ref Range Status   SARS Coronavirus 2 by RT PCR NEGATIVE NEGATIVE Final   Influenza A by PCR POSITIVE (A) NEGATIVE Final   Influenza B by PCR NEGATIVE NEGATIVE Final    Comment:  (NOTE) The Xpert Xpress SARS-CoV-2/FLU/RSV plus assay is intended as an aid in the diagnosis of influenza from Nasopharyngeal swab specimens and should not be used as a sole basis for treatment. Nasal washings and aspirates are unacceptable for Xpert Xpress SARS-CoV-2/FLU/RSV testing.  Fact Sheet for Patients: bloggercourse.com  Fact Sheet for Healthcare Providers: seriousbroker.it  This test is not yet approved or cleared by the [place]  FDA and has been authorized for detection and/or diagnosis of SARS-CoV-2 by FDA under an Emergency Use Authorization (EUA). This EUA will remain in effect (meaning this test can be used) for the duration of the COVID-19 declaration under Section 564(b)(1) of the Act, 21 U.S.C. section 360bbb-3(b)(1), unless the authorization is terminated or revoked.     Resp Syncytial Virus by PCR NEGATIVE NEGATIVE Final    Comment: (NOTE) Fact Sheet for Patients: bloggercourse.com  Fact Sheet for Healthcare Providers: seriousbroker.it  This test is not yet approved or cleared by the [place]  FDA and has been authorized for detection and/or diagnosis of SARS-CoV-2 by FDA under an Emergency Use Authorization (EUA). This EUA will remain in effect (meaning this test can be used) for the duration of the COVID-19 declaration under Section 564(b)(1) of the Act, 21 U.S.C. section 360bbb-3(b)(1), unless the authorization is terminated or revoked.  Performed at [facility], [address] [place], [place] [address]   Surgical PCR screen     Status: None   Collection Time: 10/03/24  1:36 AM   Specimen: Nasal Mucosa; Nasal Swab  Result Value Ref Range Status   MRSA, PCR NEGATIVE NEGATIVE Final   Staphylococcus aureus NEGATIVE NEGATIVE Final    Comment: (NOTE) The Xpert SA Assay (FDA approved for NASAL specimens in patients 32 years of age and  older), is one component of a comprehensive surveillance program. It is not intended to diagnose infection nor to guide or monitor treatment. Performed at [facility], [address], [place], [place] [address]     Radiology Studies: ECHO TEE Result Date: 10/02/2024    TRANSESOPHOGEAL ECHO REPORT   Patient Name:   Destiny Elliott Date of Exam: 10/02/2024 Medical Rec #:  [id number]         Height:       63.0 in Accession #:    [id number]  Weight:       114.9 lb Date of Birth:  1951-10-23         BSA:          1.528 m Patient Age:    73 years          BP:           128/78 mmHg Patient Gender: F                 HR:           123 bpm. Exam Location:  Inpatient Procedure: Transesophageal Echo and with cardioversion (Both Spectral and Color            Flow Doppler were utilized during procedure). Indications:    Afib  History:        Patient has prior history of Echocardiogram examinations.                 Arrythmias:Atrial Fibrillation.  Sonographer:    Damien Senior Referring Phys: [phone] BRIAN S CRENSHAW PROCEDURE: The transesophogeal probe was passed without difficulty through the esophogus of the patient. Sedation performed by different physician. The patient developed no complications during the procedure.  IMPRESSIONS  1. No LAA thormbus. On Rx eliquis DCC x 1 with 200 J biphasic Converted to NSR with PAC;s IV amiodarone continued.  2. EF estimated while in rapid afib rate 124 bpm. Left ventricular ejection fraction, by estimation, is 35 to 40%. The left ventricle has moderately decreased function. The left ventricle demonstrates global hypokinesis.  3. Right ventricular systolic function is normal. The right ventricular size is normal.  4. Left atrial size was moderately dilated. No left atrial/left atrial appendage thrombus was detected.  5. Right atrial size was moderately dilated.  6. The mitral valve is abnormal. Moderate mitral valve regurgitation.  7. Abnormal with multiple filamentous  structures coming off right cusp and commisure between left and no cusp. ? Lambl's excrescences. Does not look like vegetation or papilloma. No significant structural damage to valve Longest measured filament 0.8 cm. The aortic valve is tricuspid. Aortic valve regurgitation is not visualized. No aortic stenosis is present.  8. 3D performed of the aortic valve and demonstrates Abnormal AV. FINDINGS  Left Ventricle: EF estimated while in rapid afib rate 124 bpm. Left ventricular ejection fraction, by estimation, is 35 to 40%. The left ventricle has moderately decreased function. The left ventricle demonstrates global hypokinesis. The left ventricular internal cavity size was normal in size. Right Ventricle: The right ventricular size is normal. Right vetricular wall thickness was not assessed. Right ventricular systolic function is normal. Left Atrium: Left atrial size was moderately dilated. No left atrial/left atrial appendage thrombus was detected. Right Atrium: Right atrial size was moderately dilated. Pericardium: There is no evidence of pericardial effusion. Mitral Valve: The mitral valve is abnormal. Moderate mitral valve regurgitation. Tricuspid Valve: The tricuspid valve is normal in structure. Tricuspid valve regurgitation is mild. Aortic Valve: Abnormal with multiple filamentous structures coming off right cusp and commisure between left and no cusp. ? Lambl's excrescences. Does not look like vegetation or papilloma. No significant structural damage to valve Longest measured filament 0.8 cm. The aortic valve is tricuspid. Aortic valve regurgitation is not visualized. No aortic stenosis is present. Pulmonic Valve: The pulmonic valve was normal in structure. Pulmonic valve regurgitation is trivial. Aorta: The aortic root is normal in size and structure. IAS/Shunts: No atrial level shunt detected by color flow Doppler. Additional Comments: No LAA thormbus. On  Rx eliquis DCC x 1 with 200 J biphasic Converted to  NSR with PAC;s IV amiodarone continued. 3D was performed not requiring image post processing on an independent workstation and was abnormal. AORTIC VALVE LVOT Vmax:   58.50 cm/s LVOT Vmean:  42.400 cm/s LVOT VTI:    0.074 m  SHUNTS Systemic VTI: 0.07 m Maude Emmer MD Electronically signed by Maude Emmer MD Signature Date/Time: 10/02/2024/1:31:52 PM    Final    EP STUDY Result Date: 10/02/2024 See surgical note for result.  Scheduled Meds:  amiodarone  200 mg Oral BID   apixaban  5 mg Oral BID   ARIPiprazole   2 mg Oral Daily   azithromycin  500 mg Oral Daily   diphenhydrAMINE -zinc acetate   Topical BID   levETIRAcetam  500 mg Oral BID   metoprolol succinate  25 mg Oral Daily   mupirocin ointment  1 Application Nasal BID   sacubitril-valsartan  1 tablet Oral BID   spironolactone  12.5 mg Oral Daily   Continuous Infusions:  cefTRIAXone  (ROCEPHIN )  IV 2 g (10/02/24 1857)    LOS: 6 days   Time spent: 50 minutes  Dr. Sherre [facility] If 7PM-7AM, please contact night-coverage 10/03/2024, 3:45 PM

## 2024-10-03 NOTE — Assessment & Plan Note (Signed)
 Resolved starting on 11/18

## 2024-10-03 NOTE — Telephone Encounter (Signed)
 Patient Product/process Development Scientist completed.    The patient is insured through [employer]/ [employer]. Patient has Medicare and is not eligible for a copay card, but may be able to apply for patient assistance or Medicare RX Payment Plan (Patient Must reach out to their plan, if eligible for payment plan), if available.    Ran test claim for sacubitril -valsartan  24-26 mg and the current 30 day co-pay is $47.00.  Ran test claim for Eliquis  5 mg and Last filled on 09/06/2024 can be filled again on 10/14/2024  This test claim was processed through [facility]- copay amounts may vary at other pharmacies due to pharmacy/plan contracts, or as the patient moves through the different stages of their insurance plan.     Reyes Sharps, CPHT Pharmacy Technician Patient Advocate Specialist Lead [facility] Patient Advocate Team Direct Number: [phone]  Fax: [phone]

## 2024-10-03 NOTE — Evaluation (Signed)
 Occupational Therapy Evaluation Patient Details Name: Destiny Elliott MRN: [id number] DOB: 08-16-1951 Today's Date: 10/03/2024   History of Present Illness   Pt is a 73 y.o. female who presented 09/27/24 due to afib with RVR. 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 11/19 TEE complete. PMH: AKI, seizures, acute hypoxia respiratory faluire, CHF     Clinical Impressions Pt at PLOF was indep and lives with husband but she was assisting husband with IADLS and med management. Pt's daughter reported they can stay with them for the first couple of days but will require to work soon. At this time pt was able to complete UE with set up and LE with CGA sitting to standing. Pt completed toileting tasks with CGA and cues on sequencing with safety. Attempted HHA vs RW and pt reported feeling more comfortable with walker as has not been ambulating in days. Pt noted to have one LOB in session and required min assist to steady with use of RW. At this time as daughter reported pt does not want to go to rehab recommendation to have someone with pt with CGA and max HH then OP neuro therapy.   Bps: Supine: 130/79 (94)  Following toileting: 148/77 (99) Following ambulation: 154/124 (135) Repeat following ambulation: 127/76 (93)    If plan is discharge home, recommend the following:   A little help with walking and/or transfers;A little help with bathing/dressing/bathroom;Assistance with cooking/housework;Assist for transportation;Help with stairs or ramp for entrance     Functional Status Assessment   Patient has had a recent decline in their functional status and demonstrates the ability to make significant improvements in function in a reasonable and predictable amount of time.     Equipment Recommendations   BSC/3in1 (RW)     Recommendations for Other Services          Precautions/Restrictions   Precautions Precautions: None Recall of Precautions/Restrictions: Intact Precaution/Restrictions Comments: watch BP Restrictions Weight Bearing Restrictions Per Provider Order: No     Mobility Bed Mobility Overal bed mobility: Needs Assistance Bed Mobility: Supine to Sit, Sit to Supine     Supine to sit: Modified independent (Device/Increase time) Sit to supine: Modified independent (Device/Increase time)        Transfers Overall transfer level: Needs assistance Equipment used: Rolling walker (2 wheels), None, 1 person hand held assist Transfers: Sit to/from Stand Sit to Stand: Supervision           General transfer comment: Pt intially gave HHA, used grab bar in bathroom and RW x2 trials      Balance Overall balance assessment: Needs assistance Sitting-balance support: Feet supported Sitting balance-Leahy Scale: Good     Standing balance support: Single extremity supported, Bilateral upper extremity supported, No upper extremity supported Standing balance-Leahy Scale: Fair Standing balance comment: noted increae in balance with RW with ambulation but decrease in ability to navigate in smaller spaces                           ADL either performed or assessed with clinical judgement   ADL Overall ADL's : Needs assistance/impaired Eating/Feeding: Independent;Sitting   Grooming: Wash/dry face;Set up;Sitting   Upper Body Bathing: Set up;Sitting   Lower Body Bathing: Contact guard assist;Sit to/from stand   Upper Body Dressing : Set up   Lower Body Dressing: Contact guard assist;Sit to/from stand  Toilet Transfer: Contact guard assist;Rolling walker (2 wheels) Toilet Transfer Details (indicate cue type and reason): HHA Toileting- Clothing Manipulation and Hygiene: Contact guard assist;Sit to/from stand Toileting - Clothing Manipulation Details (indicate cue type and reason): with grab bar     Functional mobility  during ADLs: Contact guard assist;Rolling walker (2 wheels);Cueing for sequencing;Cueing for safety;Minimal assistance General ADL Comments: pt noted to have 1 LOb while ambulated an needed min assist to stady self     Vision         Perception         Praxis         Pertinent Vitals/Pain Pain Assessment Pain Assessment: No/denies pain     Extremity/Trunk Assessment Upper Extremity Assessment Upper Extremity Assessment: Overall WFL for tasks assessed   Lower Extremity Assessment Lower Extremity Assessment: Defer to PT evaluation   Cervical / Trunk Assessment Cervical / Trunk Assessment: Normal   Communication Communication Communication:  (noted sometimes needs increase in time)   Cognition Arousal: Alert Behavior During Therapy: WFL for tasks assessed/performed Cognition:  (Pt noted to have delay in responces/decrease in processing speed but answered all questions)                               Following commands: Intact       Cueing  General Comments   Cueing Techniques: Verbal cues      Exercises     Shoulder Instructions      Home Living Family/patient expects to be discharged to:: Private residence Living Arrangements: Spouse/significant other Available Help at Discharge: Family Type of Home: House Home Access: Stairs to enter [employer] of Steps: 2 or 4 (2 steps has no rail 4 has rail on L side) Entrance Stairs-Rails: Left Home Layout: One level     Bathroom Shower/Tub: Producer, Television/film/video: Standard Bathroom Accessibility: Yes How Accessible: Accessible via walker Home Equipment: Shower seat          Prior Functioning/Environment Prior Level of Function : Independent/Modified Independent             Mobility Comments: indep ADLs Comments: indep and manages husband meds    OT Problem List: Decreased strength;Decreased activity tolerance;Impaired balance (sitting and/or standing);Decreased  safety awareness;Decreased knowledge of use of DME or AE   OT Treatment/Interventions: Self-care/ADL training;Therapeutic exercise;DME and/or AE instruction;Therapeutic activities;Patient/family education;Balance training      OT Goals(Current goals can be found in the care plan section)   Acute Rehab OT Goals Patient Stated Goal: to go home OT Goal Formulation: With patient Time For Goal Achievement: 10/17/24 Potential to Achieve Goals: Fair   OT Frequency:  Min 2X/week    Co-evaluation              AM-PAC OT 6 Clicks Daily Activity     Outcome Measure Help from another person eating meals?: None Help from another person taking care of personal grooming?: None Help from another person toileting, which includes using toliet, bedpan, or urinal?: A Little Help from another person bathing (including washing, rinsing, drying)?: A Little Help from another person to put on and taking off regular upper body clothing?: None Help from another person to put on and taking off regular lower body clothing?: A Little 6 Click Score: 21   End of Session Equipment Utilized During Treatment: Gait belt;Rolling walker (2 wheels) Nurse Communication: Mobility status  Activity Tolerance: Patient tolerated treatment well Patient  left: in bed;with call bell/phone within reach;with bed alarm set  OT Visit Diagnosis: Unsteadiness on feet (R26.81);Other abnormalities of gait and mobility (R26.89);Muscle weakness (generalized) (M62.81)                Time: 8790-8750 OT Time Calculation (min): 40 min Charges:  OT General Charges $OT Visit: 1 Visit OT Evaluation $OT Eval Low Complexity: 1 Low OT Treatments $Self Care/Home Management : 23-37 mins  Warrick POUR OTR/L  Acute Rehab Services  [phone] office number   Warrick Berber 10/03/2024, 1:00 PM

## 2024-10-03 NOTE — Progress Notes (Signed)
 Rounding Note   Patient Name: Destiny Elliott Date of Encounter: 10/03/2024   [facility] Cardiologist: Lonni LITTIE Nanas, MD   Subjective Pt denies CP or dyspnea; complains of cough  Scheduled Meds:  apixaban  5 mg Oral BID   ARIPiprazole   2 mg Oral Daily   azithromycin  500 mg Oral Daily   levETIRAcetam  500 mg Oral BID   metoprolol tartrate  12.5 mg Oral BID   mupirocin ointment  1 Application Nasal BID   Continuous Infusions:  amiodarone 30 mg/hr (10/03/24 0309)   cefTRIAXone  (ROCEPHIN )  IV 2 g (10/02/24 1857)   PRN Meds: acetaminophen  **OR** acetaminophen , benzonatate, guaiFENesin, melatonin, ondansetron  (ZOFRAN ) IV   Vital Signs  Vitals:   10/02/24 1958 10/02/24 2327 10/03/24 0424 10/03/24 0755  BP: 138/86 (!) 147/64 125/70 133/73  Pulse: (!) 110 75 (!) 58 69  Resp: 20 20 19 14   Temp: 97.9 F (36.6 C) 98.4 F (36.9 C) 98.1 F (36.7 C) 98 F (36.7 C)  TempSrc: Oral Oral Oral Oral  SpO2: 96% 93% 93% 95%  Weight:   55.7 kg   Height:       No intake or output data in the 24 hours ending 10/03/24 0800     10/03/2024    4:24 AM 10/02/2024    4:05 AM 09/30/2024    5:29 AM  Last 3 Weights  Weight (lbs) 122 lb 12.7 oz 114 lb 13.8 oz 116 lb 11.2 oz  Weight (kg) 55.7 kg 52.1 kg 52.935 kg      Telemetry Sinus with PACs- Personally Reviewed    Physical Exam  GEN: NAD, WN Neck: supple Cardiac: RRR Respiratory: CTA GI: Soft, NT/ND MS: No edema Neuro:  Grossly intact Psych: Normal affect   Labs High Sensitivity Troponin:   Recent Labs  Lab 09/27/24 1923 09/27/24 2139 09/28/24 0053 09/28/24 0445  TROPONINIHS 27* 42* 53* 53*     Chemistry Recent Labs  Lab 09/29/24 0540 09/30/24 0630 10/01/24 0558 10/02/24 0430 10/03/24 0401  NA 130* 129* 129* 133* 133*  K 3.9 3.2* 4.0 4.0 4.3  CL 93* 89* 94* 94* 96*  CO2 25 26 22 24 27   GLUCOSE 118* 97 106* 79 86  BUN 21 28* 44* 28* 17  CREATININE 0.95 1.39* 1.85* 1.21* 1.00   CALCIUM 8.1* 7.9* 7.9* 8.4* 7.9*  MG 2.4 2.2  --  2.3  --   PROT 6.3*  --  5.7* 6.6  --   ALBUMIN 3.3*  --  3.3* 3.6  --   AST 60*  --  43* 41  --   ALT 32  --  19 21  --   ALKPHOS 87  --  62 59  --   BILITOT 1.7*  --  0.9 0.7  --   GFRNONAA >60 40* 28* 47* 59*  ANIONGAP 12 14 13 15 10      Hematology Recent Labs  Lab 10/01/24 0558 10/02/24 0430 10/03/24 0401  WBC 4.9 2.8* 2.8*  RBC 4.27 4.32 3.85*  HGB 12.5 12.6 11.1*  HCT 36.6 37.1 33.8*  MCV 85.7 85.9 87.8  MCH 29.3 29.2 28.8  MCHC 34.2 34.0 32.8  RDW 13.6 13.4 13.4  PLT 185 195 170   Thyroid   Recent Labs  Lab 09/27/24 1923  TSH 1.129    BNP Recent Labs  Lab 09/28/24 0606  BNP 780.2*     Radiology  ECHO TEE Result Date: 10/02/2024    TRANSESOPHOGEAL ECHO REPORT   Patient  Name:   Destiny Elliott Date of Exam: 10/02/2024 Medical Rec #:  [id number]         Height:       63.0 in Accession #:    [id number]        Weight:       114.9 lb Date of Birth:  09/10/1951         BSA:          1.528 m Patient Age:    73 years          BP:           128/78 mmHg Patient Gender: F                 HR:           123 bpm. Exam Location:  Inpatient Procedure: Transesophageal Echo and with cardioversion (Both Spectral and Color            Flow Doppler were utilized during procedure). Indications:    Afib  History:        Patient has prior history of Echocardiogram examinations.                 Arrythmias:Atrial Fibrillation.  Sonographer:    Damien Senior Referring Phys: [phone] Hayward Rylander S Lonald Troiani PROCEDURE: The transesophogeal probe was passed without difficulty through the esophogus of the patient. Sedation performed by different physician. The patient developed no complications during the procedure.  IMPRESSIONS  1. No LAA thormbus. On Rx eliquis DCC x 1 with 200 J biphasic Converted to NSR with PAC;s IV amiodarone continued.  2. EF estimated while in rapid afib rate 124 bpm. Left ventricular ejection fraction, by estimation, is 35 to 40%. The  left ventricle has moderately decreased function. The left ventricle demonstrates global hypokinesis.  3. Right ventricular systolic function is normal. The right ventricular size is normal.  4. Left atrial size was moderately dilated. No left atrial/left atrial appendage thrombus was detected.  5. Right atrial size was moderately dilated.  6. The mitral valve is abnormal. Moderate mitral valve regurgitation.  7. Abnormal with multiple filamentous structures coming off right cusp and commisure between left and no cusp. ? Lambl's excrescences. Does not look like vegetation or papilloma. No significant structural damage to valve Longest measured filament 0.8 cm. The aortic valve is tricuspid. Aortic valve regurgitation is not visualized. No aortic stenosis is present.  8. 3D performed of the aortic valve and demonstrates Abnormal AV. FINDINGS  Left Ventricle: EF estimated while in rapid afib rate 124 bpm. Left ventricular ejection fraction, by estimation, is 35 to 40%. The left ventricle has moderately decreased function. The left ventricle demonstrates global hypokinesis. The left ventricular internal cavity size was normal in size. Right Ventricle: The right ventricular size is normal. Right vetricular wall thickness was not assessed. Right ventricular systolic function is normal. Left Atrium: Left atrial size was moderately dilated. No left atrial/left atrial appendage thrombus was detected. Right Atrium: Right atrial size was moderately dilated. Pericardium: There is no evidence of pericardial effusion. Mitral Valve: The mitral valve is abnormal. Moderate mitral valve regurgitation. Tricuspid Valve: The tricuspid valve is normal in structure. Tricuspid valve regurgitation is mild. Aortic Valve: Abnormal with multiple filamentous structures coming off right cusp and commisure between left and no cusp. ? Lambl's excrescences. Does not look like vegetation or papilloma. No significant structural damage to valve  Longest measured filament 0.8 cm. The aortic valve is tricuspid. Aortic valve regurgitation is not  visualized. No aortic stenosis is present. Pulmonic Valve: The pulmonic valve was normal in structure. Pulmonic valve regurgitation is trivial. Aorta: The aortic root is normal in size and structure. IAS/Shunts: No atrial level shunt detected by color flow Doppler. Additional Comments: No LAA thormbus. On Rx eliquis DCC x 1 with 200 J biphasic Converted to NSR with PAC;s IV amiodarone continued. 3D was performed not requiring image post processing on an independent workstation and was abnormal. AORTIC VALVE LVOT Vmax:   58.50 cm/s LVOT Vmean:  42.400 cm/s LVOT VTI:    0.074 m  SHUNTS Systemic VTI: 0.07 m Maude Emmer MD Electronically signed by Maude Emmer MD Signature Date/Time: 10/02/2024/1:31:52 PM    Final    EP STUDY Result Date: 10/02/2024 See surgical note for result.  DG CHEST PORT 1 VIEW Result Date: 10/01/2024 EXAM: 1 VIEW(S) XRAY OF THE CHEST 10/01/2024 08:37:00 AM COMPARISON: 09/27/2024 CLINICAL HISTORY: Fever FINDINGS: LUNGS AND PLEURA: Previous interstitial accentuation has mostly resolved. Linear subsegmental atelectasis or scarring at the left lung base. No pleural effusion. No pneumothorax. HEART AND MEDIASTINUM: Mild cardiomegaly, stable. Aortic arch atherosclerosis. BONES AND SOFT TISSUES: No acute osseous abnormality. IMPRESSION: 1. Linear subsegmental atelectasis or scarring at the left lung base. 2. Mild cardiomegaly, stable. Previous interstitial edema has resolved. 3. Aortic arch atherosclerosis. Electronically signed by: Ryan Salvage MD 10/01/2024 12:43 PM EST RP Workstation: [id number]   MR BRAIN WO CONTRAST Result Date: 10/01/2024 EXAM: MRI BRAIN WITHOUT CONTRAST 10/01/2024 09:45:08 AM TECHNIQUE: Multiplanar multisequence MRI of the head/brain was performed without the administration of intravenous contrast. COMPARISON: MR Head 09/04/2017 and CT head 09/29/2024. CLINICAL  HISTORY: Neuro deficit, acute, stroke suspected. FINDINGS: BRAIN AND VENTRICLES: Punctate focus of restricted diffusion involving the right parietal lobe, post central gyrus, without appreciable associated T2/FLAIR hyperintensity. Questionable additional punctate focus of restricted diffusion involving the right frontal lobe (series 2, image 43). Innumerable small and punctate foci of susceptibility within the supratentorial and infratentorial parenchyma in keeping with the sequela of chronic microhemorrhages, significantly increased since prior exam, and relatively sparing the deep gray nuclei. Chronic regions of encephalomalacia, gliosis, and hemosiderin deposition involving the right frontal and left lateral temporal lobes, likely also reflecting the sequelae of prior hemorrhages, new since 2018. Overall similar moderate-to-severe scattered periventricular and subcortical white matter T2 hyperintensities, which are nonspecific but most commonly reflect the sequelae of chronic microvascular ischemia. No mass. No midline shift. No hydrocephalus. The sella is unremarkable. Normal flow voids. ORBITS: No acute abnormality. SINUSES AND MASTOIDS: Trace bilateral mastoid effusions. BONES AND SOFT TISSUES: Normal marrow signal. No acute soft tissue abnormality. IMPRESSION: 1. Acute or subacute punctate infarction involving the right parietal lobe, postcentral gyrus region. Questionable additional punctate infarct involving the high right frontal lobe. 2. No acute intracranial hemorrhage or mass effect. 3. Since 09/04/2017, significantly progressed innumerable foci of parenchymal susceptibility, with relative sparing of the deep gray nuclei. Interval chronic lobar hemorrhages involving the right frontal and left temporal lobes. 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 4. Overall similar moderate-to-severe subcortical and periventricular white matter signal abnormality, most commonly  reflecting the sequela of chronic microangiopathic ischemic disease. Electronically signed by: prentice spade 10/01/2024 10:46 AM EST RP Workstation: [id number]    Patient Profile   Destiny Elliott is a 72 y.o. female with a history of paroxysmal atrial fibrillation/ flutter on Eliquis, external SVT, hypertension, hyperlipidemia, and bipolar disorder who is being seen 09/28/2024 for the evaluation of atrial flutter with RVR.  Echocardiogram this  admission shows ejection fraction 35%, mild left ventricular hypertrophy, mild RV dysfunction, mild mitral regurgitation.  LV function newly reduced compared to echocardiogram March 2025.  Assessment & Plan  1 atrial fibrillation/flutter-patient back in sinus rhythm status post TEE cardioversion yesterday.  Change IV amiodarone to oral (200 mg twice daily for 1 week then 200 mg daily thereafter).  Change metoprolol to Toprol 25 mg daily. Recent MRI shows CVA and amyloid angiopathy with increased risk of intracranial hemorrhage.  Continue apixaban for now.  She will be seen as an outpatient for consideration of Watchman device given increased risk of intracranial hemorrhage based on amyloid angiopathy.  Ultimately would plan to continue amiodarone for approximately 8-12 weeks and then discontinue given her young age.  Hopefully atrial arrhythmias were precipitated by influenza.  Can also consider addition of flecainide  or referral for ablation in the future.  2 cardiomyopathy-etiology unclear.  Question tachycardia mediated.  Heart rate now improved status post TEE guided cardioversion.  Change metoprolol to Toprol.  Add Entresto 24/26 twice daily.  Add spironolactone 12.5 mg daily.  Plan to repeat echocardiogram in 3 months to see if LV function has improved now that sinus has been reestablished.  If not would need ischemia evaluation.  3 acute systolic congestive heart failure-not markedly volume overloaded on examination.  Plan as outlined above.  4  filamentous strands on aortic side of aortic valve-question Lambl's excrescence.  No aortic insufficiency noted.  Will order blood cultures though doubt endocarditis.  Blood cultures may also not be helpful as she is on antibiotics.  Not planning further intervention at this time but follow-up as an outpatient.  5 influenza A-per primary service.    6 hypertension-will change metoprolol to Toprol as outlined and add Entresto.  Follow blood pressure and adjust medications as needed.  For questions or updates, please contact [facility]   Signed, Redell Shallow, MD  10/03/2024, 8:00 AM

## 2024-10-03 NOTE — Assessment & Plan Note (Addendum)
 New; suspect contact dermatitis, from cardioversion patch Diphenhydramine -zinc acetate, topical twice daily, 2 days ordered

## 2024-10-03 NOTE — Assessment & Plan Note (Signed)
 Stable Patient denies chest pain currently I suspect this is secondary to demand ischemia in setting of atrial fibrillation with RVR

## 2024-10-03 NOTE — Assessment & Plan Note (Signed)
 Patient and daughter reports that Tessalon  Perles did not help patient with the cough. Patient reports she did like the syrup, Robitussin however would like something stronger at night so that she can sleep and rest Discontinue Tessalon  Perles Continue guaifenesin  5 mm p.o. every 4 hours as needed to loosen phlegm and cough during the day Tussionex 5 mL p.o. nightly as needed for cough at night, 2 days ordered

## 2024-10-03 NOTE — Assessment & Plan Note (Signed)
 Stable

## 2024-10-03 NOTE — Progress Notes (Signed)
   10/03/24 1010  Spiritual Encounters  Type of Visit Initial  Care provided to: Patient;Family  Reason for visit Routine spiritual support  OnCall Visit No   Patient consult for support and prayer. Daughter Sonny was present and grateful and appreciative for our concern and care.

## 2024-10-03 NOTE — Assessment & Plan Note (Addendum)
 11/19: I assumed care and patient is no longer requiring supplemental oxygen 11/19: Resolved Suspect secondary to left lung base pneumonia Continue with ceftriaxone  2 g IV daily, day 3 Azithromycin 500 mg, day 3

## 2024-10-03 NOTE — Evaluation (Signed)
 Clinical/Bedside Swallow Evaluation Patient Details  Name: Destiny Elliott MRN: [id number] Date of Birth: [birth year]  Today's Date: 10/03/2024 Time: SLP Start Time (ACUTE ONLY): 9095 SLP Stop Time (ACUTE ONLY): 9072 SLP Time Calculation (min) (ACUTE ONLY): 23 min  Past Medical History:  Past Medical History:  Diagnosis Date   Abnormal Pap smear 1994   --CIN I   Allergic rhinitis    Anxiety    Dense breasts    Endocervical polyp 01/1993   -benign   Hypercholesterolemia    Hypertension    Osteopenia    Paroxysmal atrial fibrillation [facility])    Past Surgical History:  Past Surgical History:  Procedure Laterality Date   CARDIOVERSION N/A 10/02/2024   Procedure: CARDIOVERSION;  Surgeon: Delford Maude BROCKS, MD;  Location: [facility] INVASIVE CV LAB;  Service: Cardiovascular;  Laterality: N/A;   CERVICAL BIOPSY  W/ LOOP ELECTRODE EXCISION  03/1993   CIN I   COLONOSCOPY  2016   endocervical polyp  01-27-93   -benign   TOOTH EXTRACTION  08/2017   TRANSESOPHAGEAL ECHOCARDIOGRAM (CATH LAB) N/A 10/02/2024   Procedure: TRANSESOPHAGEAL ECHOCARDIOGRAM;  Surgeon: Delford Maude BROCKS, MD;  Location: [facility] INVASIVE CV LAB;  Service: Cardiovascular;  Laterality: N/A;   HPI:  Patient is a 73 y.o. female with a history of A-fib on Eliquis, hypertension, hyperlipidemia, CHF, anxiety who presented on 11/14 d/t shortness of breath, admitted with a diagnosis of A-fib with RVR and new acute CHF (systolic based on echo results), receiving Eliquis and on Amio drip. Code stroke on 11/16 for acute change in mental status and aphasia. MRI on 11/18 showed acute or subacute punctate infarction involving the right parietal lobe, postcentral gyrus region. Questionable additional punctate infarct involving the high right frontal lobe. No acute intracranial hemorrhage or mass effect. Patient returned to baseline on 11/19, per MD note, TIA likely combined with seizure like activity due to Afib and hypotension complicated by transition  from dilt to atenolol  with subsequent SOB and Acute CHF. Bedside swallow evaluation ordered on 11/18 to assess patient's swallow.    Assessment / Plan / Recommendation  Clinical Impression  Patient is not currently presenting with clinical s/s of dysphagia as per this BSE. She was awake, alert, and participated fully in this evaluation. Patient's daughter was at bedside. SLP assessed patient's swallow via PO's of thin liquids and mechanical soft solids. Swallow initiation was timely and no overt s/s aspiration observed with straw sips of thin liquids and bites of mechanical soft solids. Patient complained of lingering cough from the past few weeks but cough not observed following any PO intake. SLP is recommending patient continue regular thin diet. Speech language evaluation not ordered but SLP discussed this with patient. During initial concerns of word finding difficulties noted, patient's daughter indicated that all family members have had some cognitive linguistic decline since trauma of patient's son dying by suicide 11 years ago. SLP recommended patient seek out outpatient SLP if continued concerns for cognitive linguistic impairment. SLP educated patient and daughter on importance of keeping brain active and engaged. Also educated patient on using purees to help swallow difficult pills. Patient and daughter in agreement that SLP is signing off at this time. SLP Visit Diagnosis: Dysphagia, unspecified (R13.10)    Aspiration Risk  No limitations    Diet Recommendation Regular;Thin liquid    Liquid Administration via: Cup;Straw Medication Administration: Other (Comment) (As tolerated.) Supervision: Patient able to self feed Compensations: Slow rate;Small sips/bites Postural Changes: Seated upright at 90 degrees;Remain upright  for at least 30 minutes after po intake    Other  Recommendations Oral Care Recommendations: Oral care BID     Assistance Recommended at Discharge    Functional  Status Assessment Patient has had a recent decline in their functional status and demonstrates the ability to make significant improvements in function in a reasonable and predictable amount of time.  Frequency and Duration            Prognosis        Swallow Study   General Date of Onset: 10/03/24 HPI: Patient is a 73 y.o. female with a history of A-fib on Eliquis , hypertension, hyperlipidemia, CHF, anxiety who presented on 11/14 d/t shortness of breath, admitted with a diagnosis of A-fib with RVR and new acute CHF (systolic based on echo results), receiving Eliquis  and on Amio drip. Code stroke on 11/16 for acute change in mental status and aphasia. MRI on 11/18 showed acute or subacute punctate infarction involving the right parietal lobe, postcentral gyrus region. Questionable additional punctate infarct involving the high right frontal lobe. No acute intracranial hemorrhage or mass effect. Patient returned to baseline on 11/19, per MD note, TIA likely combined with seizure like activity due to Afib and hypotension complicated by transition from dilt to atenolol  with subsequent SOB and Acute CHF. Bedside swallow evaluation ordered on 11/18 to assess patient's swallow. Type of Study: Bedside Swallow Evaluation Diet Prior to this Study: Regular;Other (Comment);Thin liquids (Level 0) (Heart diet) Temperature Spikes Noted: No Respiratory Status: Room air History of Recent Intubation: No Behavior/Cognition: Alert;Pleasant mood;Cooperative Oral Cavity Assessment: Within Functional Limits Oral Care Completed by SLP: No Oral Cavity - Dentition: Dentures, top Vision: Functional for self-feeding Self-Feeding Abilities: Able to feed self Patient Positioning: Upright in bed Baseline Vocal Quality: Normal Volitional Cough: Strong    Oral/Motor/Sensory Function Overall Oral Motor/Sensory Function: Within functional limits   Ice Chips     Thin Liquid Thin Liquid: Within functional  limits Presentation: Self Fed;Straw    Nectar Thick     Honey Thick     Puree     Solid     Solid: Within functional limits Presentation: Self Fed     Damien Hy  Graduate SLP Clinican

## 2024-10-04 ENCOUNTER — Ambulatory Visit: Admitting: Cardiovascular Disease

## 2024-10-04 ENCOUNTER — Other Ambulatory Visit (HOSPITAL_COMMUNITY): Payer: Self-pay

## 2024-10-04 ENCOUNTER — Other Ambulatory Visit: Payer: Self-pay | Admitting: Cardiology

## 2024-10-04 DIAGNOSIS — I5033 Acute on chronic diastolic (congestive) heart failure: Secondary | ICD-10-CM

## 2024-10-04 DIAGNOSIS — I4891 Unspecified atrial fibrillation: Secondary | ICD-10-CM | POA: Diagnosis not present

## 2024-10-04 LAB — CBC
HCT: 37.7 % (ref 36.0–46.0)
Hemoglobin: 13.1 g/dL (ref 12.0–15.0)
MCH: 29.2 pg (ref 26.0–34.0)
MCHC: 34.7 g/dL (ref 30.0–36.0)
MCV: 84.2 fL (ref 80.0–100.0)
Platelets: 208 K/uL (ref 150–400)
RBC: 4.48 MIL/uL (ref 3.87–5.11)
RDW: 13.2 % (ref 11.5–15.5)
WBC: 3 K/uL — ABNORMAL LOW (ref 4.0–10.5)
nRBC: 0 % (ref 0.0–0.2)

## 2024-10-04 LAB — BASIC METABOLIC PANEL WITH GFR
Anion gap: 13 (ref 5–15)
BUN: 10 mg/dL (ref 8–23)
CO2: 23 mmol/L (ref 22–32)
Calcium: 8.3 mg/dL — ABNORMAL LOW (ref 8.9–10.3)
Chloride: 98 mmol/L (ref 98–111)
Creatinine, Ser: 0.78 mg/dL (ref 0.44–1.00)
GFR, Estimated: >60 mL/min (ref >60)
Glucose, Bld: 101 mg/dL — ABNORMAL HIGH (ref 70–99)
Potassium: 3.6 mmol/L (ref 3.5–5.1)
Sodium: 134 mmol/L — ABNORMAL LOW (ref 135–145)

## 2024-10-04 LAB — HEPATIC FUNCTION PANEL
ALT: 21 U/L (ref 0–44)
AST: 44 U/L — ABNORMAL HIGH (ref 15–41)
Albumin: 3.5 g/dL (ref 3.5–5.0)
Alkaline Phosphatase: 61 U/L (ref 38–126)
Bilirubin, Direct: 0.2 mg/dL (ref 0.0–0.2)
Indirect Bilirubin: 0.9 mg/dL (ref 0.3–0.9)
Total Bilirubin: 1.1 mg/dL (ref 0.0–1.2)
Total Protein: 6.7 g/dL (ref 6.5–8.1)

## 2024-10-04 MED ORDER — AZITHROMYCIN 500 MG PO TABS
ORAL_TABLET | ORAL | 0 refills | Status: AC
Start: 2024-10-05 — End: 2024-10-05
  Filled 2024-10-04: qty 1, 1d supply, fill #0

## 2024-10-04 MED ORDER — AZITHROMYCIN 500 MG PO TABS
ORAL_TABLET | ORAL | 0 refills | Status: DC
  Filled 2024-10-04: qty 6, 0d supply, fill #0

## 2024-10-04 MED ORDER — SACUBITRIL-VALSARTAN 24-26 MG PO TABS
1.0000 | ORAL_TABLET | Freq: Two times a day (BID) | ORAL | 1 refills | Status: DC
  Filled 2024-10-04: qty 60, 30d supply, fill #0

## 2024-10-04 MED ORDER — LEVETIRACETAM 500 MG PO TABS
500.0000 mg | ORAL_TABLET | Freq: Two times a day (BID) | ORAL | 1 refills | Status: AC
Start: 2024-10-04 — End: ?
  Filled 2024-10-04: qty 60, 30d supply, fill #0

## 2024-10-04 MED ORDER — SPIRONOLACTONE 25 MG PO TABS
12.5000 mg | ORAL_TABLET | Freq: Every day | ORAL | 1 refills | Status: DC
  Filled 2024-10-04: qty 15, 30d supply, fill #0

## 2024-10-04 MED ORDER — AMIODARONE HCL 200 MG PO TABS
200.0000 mg | ORAL_TABLET | Freq: Every day | ORAL | 1 refills | Status: DC
  Filled 2024-10-04: qty 30, 30d supply, fill #0

## 2024-10-04 MED ORDER — METOPROLOL SUCCINATE ER 25 MG PO TB24
25.0000 mg | ORAL_TABLET | Freq: Every day | ORAL | 1 refills | Status: DC
  Filled 2024-10-04: qty 30, 30d supply, fill #0

## 2024-10-04 MED ORDER — AMIODARONE HCL 200 MG PO TABS
200.0000 mg | ORAL_TABLET | Freq: Every day | ORAL | Status: DC
  Administered 2024-10-04: 200 mg via ORAL
  Filled 2024-10-04: qty 1

## 2024-10-04 NOTE — Discharge Summary (Addendum)
 Physician Discharge Summary   Patient: Destiny Elliott MRN: [id number] DOB: 1951/10/04  Admit date:     09/27/2024  Discharge date: 10/04/24  Discharge Physician: Dr. Sherre   PCP: Verena Mems, MD   Recommendations at discharge:   Please call to ensure that you have a follow-up appointment with the cardiology department within 1 month of discharge. Please ensure you have a follow-up appointment with your primary care provider within 2 weeks on discharge. Complete azithromycin  for 1 more dose. Amiodarone  200 mg daily to be continued until you see a cardiologist. Metoprolol  succinate 25 mg daily, Entresto  24-26 mg p.o. twice daily, spironolactone  12.5 mg daily have been prescribed on discharge.  Discharge Diagnoses: Principal Problem:   Atrial fibrillation with RVR (HCC) Active Problems:   AKI (acute kidney injury)   Elevated troponin   Acute hypoxic respiratory failure (HCC)   Seizure (HCC)   Depression   Cough   Rash due to allergy   Bipolar disorder (HCC)   Essential hypertension   Acute on chronic diastolic CHF (congestive heart failure) (HCC)   Hyponatremia   Hypokalemia  Resolved Problems:   * No resolved hospital problems. *  Hospital Course:  73 y.o. female with medical history significant for paroxysmal atrial fibrillation recently started on Eliquis , send hypertension, chronic diastolic heart failure, who is admitted to [facility] on 09/27/2024 with atrial fibrillation with RVR after presenting from home to [facility] ED complaining of shortness of breath.    In the context of recent diagnosis of atrial fibrillation, the patient has been following with cardiology in atrial fibrillation clinic, on anticoag with plans for outpt cardioversion 11/17. Pt developed progressive shortness of breath over the last 2 days, associated with some orthopnea, new onset nonproductive cough   In the ED, pt was noted to have afib in RVR with evidence of vol overload.    Significant events based on progress notes: 11/14: Patient admitted to hospitalist service for atrial fibrillation with RVR.  Cardiology consulted. 11/16: Code stroke was called.  Initial concerns for seizure.  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  Possible infarct in the right frontal lobe.  Neurology consulted. 11/19: TEE ordered and pending.  Cardioversion can be done potentially after results of TEE are negative for thrombus. Neurology signed off.  11/20: Amiodarone  gtt. was discontinued today.  Amiodarone  200 mg p.o. twice daily initiated.  PT, OT consulted.  Tussionex nightly as needed ordered.   11/21: LFTs reviewed.  Patient stable overnight.  Discharge.  Assessment and Plan:  * Atrial fibrillation with RVR [facility]) Cardiology consulted, patient has outpatient cardiology appointment for Watchman evaluation 11/19: Patient is status post TEE and cardioversion with cardiology Continue telemetry 11/20: Patient is in sinus rhythm today.  On amiodarone  drip and this will be discontinued today.  Will transition to amiodarone  200 mg p.o. twice daily today 11/21: Patient will be discharged with home amiodarone  200 mg daily, metoprolol  succinate 25 mg daily, Entresto  24-26 mg p.o. twice daily, spironolactone  12.5 mg daily.  AKI (acute kidney injury) On no prior CKD Recheck BMP in a.m., continue to monitor Serum creatinine today is 1.0, eGFR 59, improving  Seizure (HCC) Keppra  500 mg p.o. twice daily, this has been prescribed on discharge  Acute hypoxic respiratory failure (HCC) 11/19: I assumed care and patient is no longer requiring supplemental oxygen 11/19: Resolved Suspect secondary to left lung base pneumonia Continue with ceftriaxone  2 g IV daily, day 3 Azithromycin   500 mg, day 3  Elevated troponin Stable Patient denies chest pain currently I suspect this is secondary to demand ischemia in setting of atrial  fibrillation with RVR  Rash due to allergy New; suspect contact dermatitis, from cardioversion patch Diphenhydramine -zinc  acetate, topical twice daily, 2 days ordered    Cough Patient and daughter reports that Tessalon  Perles did not help patient with the cough. Patient reports she did like the syrup, Robitussin however would like something stronger at night so that she can sleep and rest Discontinue Tessalon  Perles Continue guaifenesin  5 mm p.o. every 4 hours as needed to loosen phlegm and cough during the day Tussionex 5 mL p.o. nightly as needed for cough at night, 2 days ordered  Depression Abilify  2 mg daily  Hypokalemia Resolved starting on 11/18  Hyponatremia Stable  Essential hypertension Metoprolol  tartrate 12.5 mg p.o. twice daily  Bipolar disorder (HCC) Abilify         Pain control - [place]  Controlled Substance Reporting System database was reviewed. and patient was instructed, not to drive, operate heavy machinery, perform activities at heights, swimming or participation in water activities or provide baby-sitting services while on Pain, Sleep and Anxiety Medications; until their outpatient Physician has advised to do so again. Also recommended to not to take more than prescribed Pain, Sleep and Anxiety Medications.  Consultants: Cardiology, neurology Procedures performed: DCCV, TEE on 11/19 Disposition: Home Diet recommendation:  Cardiac diet DISCHARGE MEDICATION: Allergies as of 10/04/2024       Reactions   Ambien [zolpidem Tartrate] Other (See Comments)   Causes depression   Gabapentin Other (See Comments)   Hypertension   Nickel Hives, Itching   Nortriptyline Other (See Comments)   Causes dry sinuses,dry mouth   Paxil [paroxetine Hcl] Other (See Comments)   Agitation   Remeron [mirtazapine] Other (See Comments)   Agitation and depression   Trazodone  Hcl Other (See Comments)   Agitation   Zoloft [sertraline Hcl] Other (See Comments)    Agitation   Zolpidem Other (See Comments)   Other Reaction(s): personality change   Amlodipine Besylate Other (See Comments)   Patient cannot recall.    Benadryl  [diphenhydramine  Hcl] Other (See Comments)   Causes nasal dryness   Buspar [buspirone] Other (See Comments)   Abnormal sensory issues   Dextromethorphan-guaifenesin  Other (See Comments)   Made patient mucus passages extremely dry   Flonase [fluticasone Propionate] Other (See Comments)   Reaction type/severity unknown   Nirmatrelvir-ritonavir Diarrhea   Other Reaction(s): diarrhea   Tylenol  Pm Extra [diphenhydramine -apap (sleep)] Other (See Comments)   Causes nasal dryness   Vitamin D  Analogs Other (See Comments)   doesn't take Vit D supplements because they make her feel bad         Medication List     STOP taking these medications    atenolol  100 MG tablet Commonly known as: TENORMIN    diltiazem  120 MG 24 hr capsule Commonly known as: CARDIZEM  CD   olmesartan  20 MG tablet Commonly known as: BENICAR        TAKE these medications    amiodarone  200 MG tablet Commonly known as: PACERONE  Take 1 tablet (200 mg total) by mouth daily. Start taking on: October 05, 2024   apixaban  5 MG Tabs tablet Commonly known as: ELIQUIS  Take 1 tablet (5 mg total) by mouth 2 (two) times daily.   ARIPiprazole  2 MG tablet Commonly known as: ABILIFY  Take 2 mg by mouth daily.   azithromycin  500 MG tablet Commonly known as: ZITHROMAX  Complete  one more dose. Start taking on: October 05, 2024   levETIRAcetam  500 MG tablet Commonly known as: KEPPRA  Take 1 tablet (500 mg total) by mouth 2 (two) times daily.   metoprolol  succinate 25 MG 24 hr tablet Commonly known as: TOPROL -XL Take 1 tablet (25 mg total) by mouth daily. Start taking on: October 05, 2024   sacubitril -valsartan  24-26 MG Commonly known as: ENTRESTO  Take 1 tablet by mouth 2 (two) times daily.   sodium chloride  0.65 % Soln nasal spray Commonly known  as: OCEAN Place into both nostrils daily.   spironolactone  25 MG tablet Commonly known as: ALDACTONE  Take 0.5 tablets (12.5 mg total) by mouth daily. Start taking on: October 05, 2024               Durable Medical Equipment  (From admission, onward)           Start     Ordered   10/04/24 1414  For home use only DME Bedside commode  Once       Question:  Patient needs a bedside commode to treat with the following condition  Answer:  General weakness   10/04/24 1414   10/04/24 1414  For home use only DME Walker rolling  Once       Question Answer Comment  Walker: With 5 Inch Wheels   Patient needs a walker to treat with the following condition General weakness      10/04/24 1414            Follow-up Information     [facility] Follow up.   Why: Rolling Walker and bedside commode. Contact information: [place], [place]  [phone]        [facility]) Follow up.   Specialty: Home Health Services Why: Home Health Physical Therapy-office to call with visit times. Contact information: [address] [place]  [phone]               Discharge Exam: Filed Weights   10/02/24 0405 10/03/24 0424 10/04/24 0535  Weight: 52.1 kg 55.7 kg 54.3 kg   Physical Exam Constitutional:      Appearance: She is normal weight.  HENT:     Head: Normocephalic and atraumatic.     Right Ear: External ear normal.     Left Ear: External ear normal.     Nose: Nose normal.     Mouth/Throat:     Mouth: Mucous membranes are moist.     Pharynx: Oropharynx is clear.  Eyes:     Extraocular Movements: Extraocular movements intact.     Conjunctiva/sclera: Conjunctivae normal.     Pupils: Pupils are equal, round, and reactive to light.  Cardiovascular:     Rate and Rhythm: Normal rate. Rhythm irregular.     Pulses: Normal pulses.     Heart sounds: Normal heart sounds.  Pulmonary:     Effort: Pulmonary effort is  normal.     Breath sounds: Normal breath sounds.  Abdominal:     General: Abdomen is flat. Bowel sounds are normal.     Palpations: Abdomen is soft.  Musculoskeletal:        General: Normal range of motion.     Cervical back: Normal range of motion.  Skin:    General: Skin is warm and dry.     Capillary Refill: Capillary refill takes less than 2 seconds.     Findings: Rash present.  Neurological:  General: No focal deficit present.     Mental Status: She is alert and oriented to person, place, and time.  Psychiatric:        Mood and Affect: Mood normal.        Behavior: Behavior normal.     Condition at discharge: fair  The results of significant diagnostics from this hospitalization (including imaging, microbiology, ancillary and laboratory) are listed below for reference.   Imaging Studies: ECHO TEE Result Date: 10/02/2024    TRANSESOPHOGEAL ECHO REPORT   Patient Name:   Valda AAMARI WEST Date of Exam: 10/02/2024 Medical Rec #:  [id number]         Height:       63.0 in Accession #:    [id number]        Weight:       114.9 lb Date of Birth:  [birth year]         BSA:          1.528 m Patient Age:    73 years          BP:           128/78 mmHg Patient Gender: F                 HR:           123 bpm. Exam Location:  Inpatient Procedure: Transesophageal Echo and with cardioversion (Both Spectral and Color            Flow Doppler were utilized during procedure). Indications:    Afib  History:        Patient has prior history of Echocardiogram examinations.                 Arrythmias:Atrial Fibrillation.  Sonographer:    Damien Senior Referring Phys: [phone] BRIAN S CRENSHAW PROCEDURE: The transesophogeal probe was passed without difficulty through the esophogus of the patient. Sedation performed by different physician. The patient developed no complications during the procedure.  IMPRESSIONS  1. No LAA thormbus. On Rx eliquis  DCC x 1 with 200 J biphasic Converted to NSR with PAC;s IV amiodarone   continued.  2. EF estimated while in rapid afib rate 124 bpm. Left ventricular ejection fraction, by estimation, is 35 to 40%. The left ventricle has moderately decreased function. The left ventricle demonstrates global hypokinesis.  3. Right ventricular systolic function is normal. The right ventricular size is normal.  4. Left atrial size was moderately dilated. No left atrial/left atrial appendage thrombus was detected.  5. Right atrial size was moderately dilated.  6. The mitral valve is abnormal. Moderate mitral valve regurgitation.  7. Abnormal with multiple filamentous structures coming off right cusp and commisure between left and no cusp. ? Lambl's excrescences. Does not look like vegetation or papilloma. No significant structural damage to valve Longest measured filament 0.8 cm. The aortic valve is tricuspid. Aortic valve regurgitation is not visualized. No aortic stenosis is present.  8. 3D performed of the aortic valve and demonstrates Abnormal AV. FINDINGS  Left Ventricle: EF estimated while in rapid afib rate 124 bpm. Left ventricular ejection fraction, by estimation, is 35 to 40%. The left ventricle has moderately decreased function. The left ventricle demonstrates global hypokinesis. The left ventricular internal cavity size was normal in size. Right Ventricle: The right ventricular size is normal. Right vetricular wall thickness was not assessed. Right ventricular systolic function is normal. Left Atrium: Left atrial size was moderately dilated. No left atrial/left atrial appendage thrombus was detected.  Right Atrium: Right atrial size was moderately dilated. Pericardium: There is no evidence of pericardial effusion. Mitral Valve: The mitral valve is abnormal. Moderate mitral valve regurgitation. Tricuspid Valve: The tricuspid valve is normal in structure. Tricuspid valve regurgitation is mild. Aortic Valve: Abnormal with multiple filamentous structures coming off right cusp and commisure between  left and no cusp. ? Lambl's excrescences. Does not look like vegetation or papilloma. No significant structural damage to valve Longest measured filament 0.8 cm. The aortic valve is tricuspid. Aortic valve regurgitation is not visualized. No aortic stenosis is present. Pulmonic Valve: The pulmonic valve was normal in structure. Pulmonic valve regurgitation is trivial. Aorta: The aortic root is normal in size and structure. IAS/Shunts: No atrial level shunt detected by color flow Doppler. Additional Comments: No LAA thormbus. On Rx eliquis  DCC x 1 with 200 J biphasic Converted to NSR with PAC;s IV amiodarone  continued. 3D was performed not requiring image post processing on an independent workstation and was abnormal. AORTIC VALVE LVOT Vmax:   58.50 cm/s LVOT Vmean:  42.400 cm/s LVOT VTI:    0.074 m  SHUNTS Systemic VTI: 0.07 m Maude Emmer MD Electronically signed by Maude Emmer MD Signature Date/Time: 10/02/2024/1:31:52 PM    Final    EP STUDY Result Date: 10/02/2024 See surgical note for result.  DG CHEST PORT 1 VIEW Result Date: 10/01/2024 EXAM: 1 VIEW(S) XRAY OF THE CHEST 10/01/2024 08:37:00 AM COMPARISON: 09/27/2024 CLINICAL HISTORY: Fever FINDINGS: LUNGS AND PLEURA: Previous interstitial accentuation has mostly resolved. Linear subsegmental atelectasis or scarring at the left lung base. No pleural effusion. No pneumothorax. HEART AND MEDIASTINUM: Mild cardiomegaly, stable. Aortic arch atherosclerosis. BONES AND SOFT TISSUES: No acute osseous abnormality. IMPRESSION: 1. Linear subsegmental atelectasis or scarring at the left lung base. 2. Mild cardiomegaly, stable. Previous interstitial edema has resolved. 3. Aortic arch atherosclerosis. Electronically signed by: Ryan Salvage MD 10/01/2024 12:43 PM EST RP Workstation: [id number]   MR BRAIN WO CONTRAST Result Date: 10/01/2024 EXAM: MRI BRAIN WITHOUT CONTRAST 10/01/2024 09:45:08 AM TECHNIQUE: Multiplanar multisequence MRI of the head/brain was  performed without the administration of intravenous contrast. COMPARISON: MR Head 09/04/2017 and CT head 09/29/2024. CLINICAL HISTORY: Neuro deficit, acute, stroke suspected. FINDINGS: BRAIN AND VENTRICLES: Punctate focus of restricted diffusion involving the right parietal lobe, post central gyrus, without appreciable associated T2/FLAIR hyperintensity. Questionable additional punctate focus of restricted diffusion involving the right frontal lobe (series 2, image 43). Innumerable small and punctate foci of susceptibility within the supratentorial and infratentorial parenchyma in keeping with the sequela of chronic microhemorrhages, significantly increased since prior exam, and relatively sparing the deep gray nuclei. Chronic regions of encephalomalacia, gliosis, and hemosiderin deposition involving the right frontal and left lateral temporal lobes, likely also reflecting the sequelae of prior hemorrhages, new since 2018. Overall similar moderate-to-severe scattered periventricular and subcortical white matter T2 hyperintensities, which are nonspecific but most commonly reflect the sequelae of chronic microvascular ischemia. No mass. No midline shift. No hydrocephalus. The sella is unremarkable. Normal flow voids. ORBITS: No acute abnormality. SINUSES AND MASTOIDS: Trace bilateral mastoid effusions. BONES AND SOFT TISSUES: Normal marrow signal. No acute soft tissue abnormality. IMPRESSION: 1. Acute or subacute punctate infarction involving the right parietal lobe, postcentral gyrus region. Questionable additional punctate infarct involving the high right frontal lobe. 2. No acute intracranial hemorrhage or mass effect. 3. Since 09/04/2017, significantly progressed innumerable foci of parenchymal susceptibility, with relative sparing of the deep gray nuclei. Interval chronic lobar hemorrhages involving the right frontal and left temporal lobes.  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  4. Overall similar moderate-to-severe subcortical and periventricular white matter signal abnormality, most commonly reflecting the sequela of chronic microangiopathic ischemic disease. Electronically signed by: prentice bybordi 10/01/2024 10:46 AM EST RP Workstation: [id number]   Overnight EEG with video Result Date: 09/30/2024 Shelton Arlin KIDD, MD     09/30/2024  2:18 PM Patient Name: Juliona Vales MRN: [id number] Epilepsy Attending: Arlin KIDD Shelton Referring Physician/Provider: Vanessa Robert, MD Duration: 09/29/2024 2040 to 09/30/2024 1205  Patient history: 73yo F with left shoulder twitching and aphasia. EEG to evaluate for seizure.  Level of alertness: Awake, asleep  AEDs during EEG study: LEV  Technical aspects: This EEG study was done with scalp electrodes positioned according to the 10-20 International system of electrode placement. Electrical activity was reviewed with band pass filter of 1-70Hz , sensitivity of 7 uV/mm, display speed of 82mm/sec with a 60Hz  notched filter applied as appropriate. EEG data were recorded continuously and digitally stored.  Video monitoring was available and reviewed as appropriate.  Description: The posterior dominant rhythm consists of 7.5 Hz activity of moderate voltage (25-35 uV) seen predominantly in posterior head regions, symmetric and reactive to eye opening and eye closing.Sleep was characterized by vertex waves, sleep spindles (12 to 14 Hz), maximal frontocentral region. EEG showed continuous generalized 3 to 6 Hz theta-delta slowing. Hyperventilation and photic stimulation were not performed.    ABNORMALITY - Continuous slow, generalized  IMPRESSION: This study is suggestive of generalized cerebral dysfunction ( encephalopathy). No seizures or epileptiform discharges were seen throughout the recording.  Arlin KIDD Shelton    EEG adult Result Date: 09/29/2024 Shelton Arlin KIDD, MD     10/04/2024  5:14 AM Patient Name: Shaquila Sigman MRN: [id number]  Epilepsy Attending: Arlin KIDD Shelton Referring Physician/Provider: Waddell Karna LABOR, NP Date: 09/29/2024 Duration: 25.32 mins Patient history: 73yo F with left shoulder twitching and aphasia. EEG to evaluate for seizure. Level of alertness: Awake AEDs during EEG study: LEV Technical aspects: This EEG study was done with scalp electrodes positioned according to the 10-20 International system of electrode placement. Electrical activity was reviewed with band pass filter of 1-70Hz , sensitivity of 7 uV/mm, display speed of 67mm/sec with a 60Hz  notched filter applied as appropriate. EEG data were recorded continuously and digitally stored.  Video monitoring was available and reviewed as appropriate. Description: EEG showed continuous generalized 3 to 6 Hz theta-delta slowing. Hyperventilation and photic stimulation were not performed.   ABNORMALITY - Continuous slow, generalized IMPRESSION: This study is suggestive of generalized cerebral dysfunction ( encephalopathy). No seizures or epileptiform discharges were seen throughout the recording. Priyanka KIDD Shelton   CT ANGIO HEAD NECK W WO CM (CODE STROKE) Result Date: 09/29/2024 EXAM: CTA HEAD AND NECK WITHOUT AND WITH 09/29/2024 04:34:52 PM TECHNIQUE: CTA of the head and neck was performed without and with the administration of 75 mL of intravenous iohexol  (OMNIPAQUE ) 350 MG/ML injection. Multiplanar 2D and/or 3D reformatted images are provided for review. Automated exposure control, iterative reconstruction, and/or weight based adjustment of the mA/kV was utilized to reduce the radiation dose to as low as reasonably achievable. Stenosis of the internal carotid arteries measured using NASCET criteria. COMPARISON: MR head 09/04/2017 CLINICAL HISTORY: Neuro deficit, acute, stroke suspected. FINDINGS: CTA NECK: AORTIC ARCH AND ARCH VESSELS: Atherosclerotic changes are present at the distal aortic arch. No dissection or arterial injury. No significant stenosis of the  brachiocephalic or subclavian arteries. CERVICAL CAROTID ARTERIES: No dissection, arterial injury, or  hemodynamically significant stenosis by NASCET criteria. CERVICAL VERTEBRAL ARTERIES: A left vertebral artery is dominant. No dissection, arterial injury, or significant stenosis. LUNGS AND MEDIASTINUM: Unremarkable. SOFT TISSUES: No acute abnormality. BONES: No acute abnormality. CTA HEAD: ANTERIOR CIRCULATION: No significant stenosis of the internal carotid arteries. No significant stenosis of the anterior cerebral arteries. No significant stenosis of the middle cerebral arteries. No aneurysm. POSTERIOR CIRCULATION: No significant stenosis of the posterior cerebral arteries. No significant stenosis of the basilar artery. No significant stenosis of the vertebral arteries. No aneurysm. OTHER: No dural venous sinus thrombosis on this non-dedicated study. IMPRESSION: 1. No large vessel occlusion, hemodynamically significant stenosis, or aneurysm in the head or neck. 2. Atherosclerotic changes at the distal aortic arch. Electronically signed by: Lonni Necessary MD 09/29/2024 04:45 PM EST RP Workstation: [id number]   CT HEAD CODE STROKE WO CONTRAST Result Date: 09/29/2024 EXAM: CT HEAD WITHOUT CONTRAST 09/29/2024 04:22:01 PM TECHNIQUE: CT of the head was performed without the administration of intravenous contrast. Automated exposure control, iterative reconstruction, and/or weight based adjustment of the mA/kV was utilized to reduce the radiation dose to as low as reasonably achievable. COMPARISON: MR head 09/04/2017. CLINICAL HISTORY: Neuro deficit, acute, stroke suspected. FINDINGS: BRAIN AND VENTRICLES: No acute hemorrhage. An age indeterminate anterior right frontal lobe infarct is present. Ventricular and subcortical white matter hypoattenuation has progressed since the prior study and is moderately advanced for age. Atherosclerotic calcifications are present in the cavernous carotid arteries bilaterally.  No hyperdense vessel is present. No hydrocephalus. No extra-axial collection. No mass effect or midline shift. ORBITS: No acute abnormality. SINUSES: No acute abnormality. SOFT TISSUES AND SKULL: No acute soft tissue abnormality. No skull fracture. [place] Stroke Program Early CT Score (ASPECTS) ----- Ganglionic (caudate, ic, lentiform nucleus, insula, M1-m3): 6 Supraganglionic (m4-m6): 3 Total: 9 IMPRESSION: 1. Age-indeterminate anterior right frontal lobe infarct; no CT evidence of acute intracranial hemorrhage or mass effect. 2. Moderately advanced ventricular and subcortical white matter hypoattenuation, progressed since the prior study. 3. Atherosclerotic calcifications in the cavernous carotid arteries bilaterally; no hyperdense vessel. 4. aspects: 9. 5. these results were communicated to Dr. Rosemarie at 4:29 PM on 09/29/2024 by secure text page via the [facility] messaging system. Electronically signed by: Lonni Necessary MD 09/29/2024 04:32 PM EST RP Workstation: [id number]   ECHOCARDIOGRAM COMPLETE Result Date: 09/29/2024    ECHOCARDIOGRAM REPORT   Patient Name:   Skila VIOLA KINNICK Date of Exam: 09/29/2024 Medical Rec #:  [id number]         Height:       63.0 in Accession #:    [id number]        Weight:       117.7 lb Date of Birth:  1950/12/19         BSA:          1.544 m Patient Age:    73 years          BP:           115/91 mmHg Patient Gender: F                 HR:           127 bpm. Exam Location:  Inpatient Procedure: 2D Echo, Cardiac Doppler and Color Doppler (Both Spectral and Color            Flow Doppler were utilized during procedure). 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 CHF,                 Abnormal ECG, Arrythmias:Atrial Fibrillation;                 Signs/Symptoms:Dyspnea and Shortness of Breath.  Sonographer:    Ellouise Mose RDCS  Referring Phys: [id number] EVA KATHEE PORE  Sonographer Comments: Technically difficult study due to poor echo windows. Patient appears to have AMS, could not recall her DOB. HR has been high since previous day. IMPRESSIONS  1. Patient tachycardic throughout exam.  2. Left ventricular ejection fraction, by estimation, is 35%. Left ventricular ejection fraction by 2D MOD biplane is 35.6 %. The left ventricle has moderately decreased function. The left ventricle demonstrates global hypokinesis. There is mild concentric left ventricular hypertrophy. Diastology indeterminate due to atrail arrhythmia.  3. Right ventricular systolic function is mildly reduced. The right ventricular size is normal. Tricuspid regurgitation signal is inadequate for assessing PA pressure.  4. There is mild prolapse of the anterior mitral valve leaflet. The mitral valve is abnormal. Mild mitral valve regurgitation. No evidence of mitral stenosis. There is mild prolapse of of the mitral valve.  5. The aortic valve is tricuspid. Aortic valve regurgitation is not visualized. No aortic stenosis is present.  6. The inferior vena cava is dilated in size with >50% respiratory variability, suggesting right atrial pressure of 8 mmHg. Comparison(s): Changes from prior study are noted. Newly reduced LV systolic function. FINDINGS  Left Ventricle: Left ventricular ejection fraction, by estimation, is 35%. Left ventricular ejection fraction by 2D MOD biplane is 35.6 %. The left ventricle has moderately decreased function. The left ventricle demonstrates global hypokinesis. The left  ventricular internal cavity size was normal in size. There is mild concentric left ventricular hypertrophy. Diastology indeterminate due to atrail arrhythmia. Right Ventricle: The right ventricular size is normal. No increase in right ventricular wall thickness. Right ventricular systolic function is mildly reduced. Tricuspid regurgitation signal is inadequate for assessing PA  pressure. Left Atrium: Left atrial size was normal in size. Right Atrium: Right atrial size was normal in size. Pericardium: There is no evidence of pericardial effusion. Mitral Valve: There is mild prolapse of the anterior mitral valve leaflet. The mitral valve is abnormal. There is mild prolapse of of the mitral valve. There is moderate thickening of the anterior and posterior mitral valve leaflet(s). Mild mitral valve regurgitation. No evidence of mitral valve stenosis. Tricuspid Valve: The tricuspid valve is normal in structure. Tricuspid valve regurgitation is trivial. No evidence of tricuspid stenosis. Aortic Valve: The aortic valve is tricuspid. Aortic valve regurgitation is not visualized. No aortic stenosis is present. Pulmonic Valve: The pulmonic valve was normal in structure. Pulmonic valve regurgitation is trivial. No evidence of pulmonic stenosis. Aorta: The aortic root and ascending aorta are structurally normal, with no evidence of dilitation. Venous: The inferior vena cava is dilated in size with greater than 50% respiratory variability, suggesting right atrial pressure of 8 mmHg. IAS/Shunts: No atrial level shunt detected by color flow Doppler.  LEFT VENTRICLE PLAX 2D                        Biplane EF (MOD) LVIDd:         3.80 cm         LV Biplane EF:   Left LVIDs:  2.80 cm                          ventricular LV PW:         1.80 cm                          ejection LV IVS:        1.40 cm                          fraction by LVOT diam:     2.30 cm                          2D MOD LV SV:         47                               biplane is LV SV Index:   31                               35.6 %. LVOT Area:     4.15 cm  LV Volumes (MOD) LV vol d, MOD    44.9 ml A2C: LV vol d, MOD    31.9 ml A4C: LV vol s, MOD    25.4 ml A2C: LV vol s, MOD    22.5 ml A4C: LV SV MOD A2C:   19.5 ml LV SV MOD A4C:   31.9 ml LV SV MOD BP:    15.0 ml RIGHT VENTRICLE             IVC RV S prime:     10.80 cm/s  IVC  diam: 2.20 cm TAPSE (M-mode): 1.6 cm LEFT ATRIUM             Index        RIGHT ATRIUM           Index LA diam:        2.60 cm 1.68 cm/m   RA Area:     16.80 cm LA Vol (A2C):   22.7 ml 14.71 ml/m  RA Volume:   44.90 ml  29.09 ml/m LA Vol (A4C):   51.8 ml 33.56 ml/m LA Biplane Vol: 35.2 ml 22.80 ml/m  AORTIC VALVE LVOT Vmax:   79.00 cm/s LVOT Vmean:  53.300 cm/s LVOT VTI:    0.114 m  AORTA Ao Root diam: 3.20 cm Ao Asc diam:  3.40 cm MITRAL VALVE MV Area (PHT): 4.80 cm    SHUNTS MV Decel Time: 158 msec    Systemic VTI:  0.11 m MV E velocity: 87.50 cm/s  Systemic Diam: 2.30 cm Georganna Archer Electronically signed by Georganna Archer Signature Date/Time: 09/29/2024/2:32:53 PM    Final    DG Chest 2 View Result Date: 09/27/2024 EXAM: 2 VIEW(S) XRAY OF THE CHEST 09/27/2024 07:46:00 PM COMPARISON: CXR 03/07/2023 CLINICAL HISTORY: chest pain FINDINGS: LUNGS AND PLEURA: Diffuse interstitial prominence and Kerley-B lines. Trace bilateral pleural effusions. No pneumothorax. HEART AND MEDIASTINUM: Cardiomegaly. Aortic calcification. BONES AND SOFT TISSUES: No acute osseous abnormality. IMPRESSION: 1. Pulmonary edema with  trace bilateral pleural effusions. 2. Cardiomegaly. Electronically signed by: Morgane Naveau MD 09/27/2024 07:52 PM EST RP Workstation: [id number]    Microbiology: Results for orders placed or performed during  the hospital encounter of 09/27/24  Resp panel by RT-PCR (RSV, Flu A&B, Covid) Anterior Nasal Swab     Status: Abnormal   Collection Time: 09/28/24  1:24 PM   Specimen: Anterior Nasal Swab  Result Value Ref Range Status   SARS Coronavirus 2 by RT PCR NEGATIVE NEGATIVE Final   Influenza A by PCR POSITIVE (A) NEGATIVE Final   Influenza B by PCR NEGATIVE NEGATIVE Final    Comment: (NOTE) The Xpert Xpress SARS-CoV-2/FLU/RSV plus assay is intended as an aid in the diagnosis of influenza from Nasopharyngeal swab specimens and should not be used as a sole basis for treatment. Nasal  washings and aspirates are unacceptable for Xpert Xpress SARS-CoV-2/FLU/RSV testing.  Fact Sheet for Patients: bloggercourse.com  Fact Sheet for Healthcare Providers: seriousbroker.it  This test is not yet approved or cleared by the [place]  FDA and has been authorized for detection and/or diagnosis of SARS-CoV-2 by FDA under an Emergency Use Authorization (EUA). This EUA will remain in effect (meaning this test can be used) for the duration of the COVID-19 declaration under Section 564(b)(1) of the Act, 21 U.S.C. section 360bbb-3(b)(1), unless the authorization is terminated or revoked.     Resp Syncytial Virus by PCR NEGATIVE NEGATIVE Final    Comment: (NOTE) Fact Sheet for Patients: bloggercourse.com  Fact Sheet for Healthcare Providers: seriousbroker.it  This test is not yet approved or cleared by the [place]  FDA and has been authorized for detection and/or diagnosis of SARS-CoV-2 by FDA under an Emergency Use Authorization (EUA). This EUA will remain in effect (meaning this test can be used) for the duration of the COVID-19 declaration under Section 564(b)(1) of the Act, 21 U.S.C. section 360bbb-3(b)(1), unless the authorization is terminated or revoked.  Performed at [facility], [address], [place], [place] [address]   Surgical PCR screen     Status: None   Collection Time: 10/03/24  1:36 AM   Specimen: Nasal Mucosa; Nasal Swab  Result Value Ref Range Status   MRSA, PCR NEGATIVE NEGATIVE Final   Staphylococcus aureus NEGATIVE NEGATIVE Final    Comment: (NOTE) The Xpert SA Assay (FDA approved for NASAL specimens in patients 56 years of age and older), is one component of a comprehensive surveillance program. It is not intended to diagnose infection nor to guide or monitor treatment. Performed at [facility], [address]  [place], [place] [address]   Culture, blood (Routine X 2) w Reflex to ID Panel     Status: None (Preliminary result)   Collection Time: 10/03/24  8:28 AM   Specimen: BLOOD RIGHT ARM  Result Value Ref Range Status   Specimen Description BLOOD RIGHT ARM  Final   Special Requests   Final    BOTTLES DRAWN AEROBIC AND ANAEROBIC Blood Culture adequate volume   Culture   Final    NO GROWTH < 24 HOURS Performed at [facility], [address] [place], [place] [address]    Report Status PENDING  Incomplete  Culture, blood (Routine X 2) w Reflex to ID Panel     Status: None (Preliminary result)   Collection Time: 10/03/24  8:35 AM   Specimen: BLOOD RIGHT HAND  Result Value Ref Range Status   Specimen Description BLOOD RIGHT HAND  Final   Special Requests   Final    BOTTLES DRAWN AEROBIC AND ANAEROBIC Blood Culture adequate volume   Culture   Final    NO GROWTH < 24 HOURS  Performed at [facility], [address], [place], [place] [address]    Report Status PENDING  Incomplete   Labs: CBC: Recent Labs  Lab 09/28/24 0445 09/29/24 0540 09/30/24 0630 10/01/24 0558 10/02/24 0430 10/03/24 0401 10/04/24 0410  WBC 5.7   < > 3.9* 4.9 2.8* 2.8* 3.0*  NEUTROABS 4.7  --   --   --   --   --   --   HGB 12.0   < > 13.6 12.5 12.6 11.1* 13.1  HCT 36.0   < > 40.3 36.6 37.1 33.8* 37.7  MCV 88.2   < > 86.9 85.7 85.9 87.8 84.2  PLT 227   < > 211 185 195 170 208   < > = values in this interval not displayed.   Basic Metabolic Panel: Recent Labs  Lab 09/28/24 0445 09/28/24 1226 09/28/24 1741 09/29/24 0540 09/30/24 0630 10/01/24 0558 10/02/24 0430 10/03/24 0401 10/04/24 0410  NA 136 134*   < > 130* 129* 129* 133* 133* 134*  K 3.0* 5.8*   < > 3.9 3.2* 4.0 4.0 4.3 3.6  CL 101 97*   < > 93* 89* 94* 94* 96* 98  CO2 24 25   < > 25 26 22 24 27 23   GLUCOSE 107* 120*   < > 118* 97 106* 79 86 101*  BUN 13 16   < > 21 28* 44* 28* 17 10  CREATININE 0.72 1.01*   < > 0.95 1.39* 1.85* 1.21*  1.00 0.78  CALCIUM 7.3* 8.3*   < > 8.1* 7.9* 7.9* 8.4* 7.9* 8.3*  MG 1.7 2.8*  --  2.4 2.2  --  2.3  --   --   PHOS 2.4*  --   --   --   --   --   --   --   --    < > = values in this interval not displayed.   Liver Function Tests: Recent Labs  Lab 09/28/24 0445 09/29/24 0540 10/01/24 0558 10/02/24 0430 10/04/24 0936  AST 47* 60* 43* 41 44*  ALT 32 32 19 21 21   ALKPHOS 79 87 62 59 61  BILITOT 1.9* 1.7* 0.9 0.7 1.1  PROT 5.6* 6.3* 5.7* 6.6 6.7  ALBUMIN  3.2* 3.3* 3.3* 3.6 3.5   CBG: Recent Labs  Lab 09/29/24 1554  GLUCAP 171*   Discharge time spent: greater than 30 minutes.  Signed: Dr. Sherre [facility] 10/04/2024

## 2024-10-04 NOTE — Plan of Care (Signed)
   Problem: Education: Goal: Knowledge of General Education information will improve Description Including pain rating scale, medication(s)/side effects and non-pharmacologic comfort measures Outcome: Progressing

## 2024-10-04 NOTE — Evaluation (Signed)
 Physical Therapy Evaluation Patient Details Name: Destiny Elliott MRN: [id number] DOB: June 22, 1951 Today's Date: 10/04/2024  History of Present Illness  Pt is a 73 y.o. female who presented 09/27/24 due to afib with RVR. 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 11/19 TEE complete. PMH: AKI, seizures, acute hypoxia respiratory faluire, CHF   Clinical Impression  PTA pt was independent for mobility with no AD. Pt presents close to mobility baseline with decreased activity tolerance and impaired balance. Pt was ModI for bed mobility and required supervision to ambulate 12ft with RW. Pt reported preferring use of RW when ambulating for increased stability. Pt will have 24/7 assist available for a few weeks from her daughter. Pt/family requesting HHPT to start with transition to neuro OP PT. Acute PT to follow to address functional limitations.          If plan is discharge home, recommend the following: A little help with walking and/or transfers;Assist for transportation;Help with stairs or ramp for entrance   Can travel by private vehicle    Yes    Equipment Recommendations Rolling walker (2 wheels)     Functional Status Assessment Patient has had a recent decline in their functional status and demonstrates the ability to make significant improvements in function in a reasonable and predictable amount of time.     Precautions / Restrictions Precautions Precautions: None Recall of Precautions/Restrictions: Intact Precaution/Restrictions Comments: watch BP Restrictions Weight Bearing Restrictions Per Provider Order: No      Mobility  Bed Mobility Overal bed mobility: Needs Assistance Bed Mobility: Supine to Sit, Sit to Supine    Supine to sit: Modified independent (Device/Increase time) Sit to supine: Modified independent (Device/Increase time)   Transfers Overall transfer  level: Needs assistance Equipment used: Rolling walker (2 wheels), None, 1 person hand held assist Transfers: Sit to/from Stand Sit to Stand: Supervision    General transfer comment: supervision for safety with cues for hand placement    Ambulation/Gait Ambulation/Gait assistance: Supervision Gait Distance (Feet): 60 Feet Assistive device: Rolling walker (2 wheels) Gait Pattern/deviations: Step-through pattern, Decreased stride length       General Gait Details: slightly decreased gait speed with difficulty managing RW with turns. Cues for RW proximity.  Modified Rankin (Stroke Patients Only) Modified Rankin (Stroke Patients Only) Pre-Morbid Rankin Score: No symptoms Modified Rankin: Moderately severe disability     Balance Overall balance assessment: Needs assistance Sitting-balance support: Feet supported Sitting balance-Leahy Scale: Good     Standing balance support: Bilateral upper extremity supported, During functional activity, Reliant on assistive device for balance Standing balance-Leahy Scale: Poor Standing balance comment: reliant on UE support        Pertinent Vitals/Pain Pain Assessment Pain Assessment: No/denies pain    Home Living Family/patient expects to be discharged to:: Private residence Living Arrangements: Spouse/significant other Available Help at Discharge: Family;Available 24 hours/day Type of Home: House Home Access: Stairs to enter Entrance Stairs-Rails: Left Entrance Stairs-Number of Steps: 2 or 4 (2 steps has no rail 4 has rail on L side)   Home Layout: One level Home Equipment: Shower seat      Prior Function Prior Level of Function : Independent/Modified Independent  Mobility Comments: indep ADLs Comments: indep and manages husband meds     Extremity/Trunk Assessment   Upper Extremity Assessment Upper Extremity Assessment: Defer to OT evaluation    Lower Extremity Assessment Lower Extremity Assessment: Overall  WFL for  tasks assessed    Cervical / Trunk Assessment Cervical / Trunk Assessment: Normal  Communication   Communication Communication: Other (comment) (noted sometimes needs increase in time)    Cognition Arousal: Alert Behavior During Therapy: WFL for tasks assessed/performed   PT - Cognitive impairments: No apparent impairments    Following commands: Intact       Cueing Cueing Techniques: Verbal cues     General Comments General comments (skin integrity, edema, etc.): VSS on RA    Exercises Other Exercises Other Exercises: x5 sit<>stand with RW and supervision      PT Assessment Patient needs continued PT services  PT Problem List Decreased activity tolerance;Decreased balance;Decreased mobility       PT Treatment Interventions DME instruction;Gait training;Stair training;Therapeutic activities;Functional mobility training;Therapeutic exercise;Balance training;Neuromuscular re-education;Patient/family education    PT Goals (Current goals can be found in the Care Plan section)  Acute Rehab PT Goals Patient Stated Goal: to be able to walk independently PT Goal Formulation: With patient/family Time For Goal Achievement: 10/18/24 Potential to Achieve Goals: Good    Frequency Min 2X/week        AM-PAC PT 6 Clicks Mobility  Outcome Measure Help needed turning from your back to your side while in a flat bed without using bedrails?: None Help needed moving from lying on your back to sitting on the side of a flat bed without using bedrails?: None Help needed moving to and from a bed to a chair (including a wheelchair)?: A Little Help needed standing up from a chair using your arms (e.g., wheelchair or bedside chair)?: A Little Help needed to walk in hospital room?: A Little Help needed climbing 3-5 steps with a railing? : A Little 6 Click Score: 20    End of Session Equipment Utilized During Treatment: Gait belt Activity Tolerance: Patient tolerated treatment  well Patient left: in bed;with call bell/phone within reach;with family/visitor present Nurse Communication: Mobility status PT Visit Diagnosis: Other abnormalities of gait and mobility (R26.89)    Time: 8854-8794 PT Time Calculation (min) (ACUTE ONLY): 20 min   Charges:   PT Evaluation $PT Eval Low Complexity: 1 Low   PT General Charges $$ ACUTE PT VISIT: 1 Visit       Kate ORN, PT, DPT Secure Chat Preferred  Rehab Office [phone]   Kate BRAVO Wendolyn 10/04/2024, 12:14 PM

## 2024-10-04 NOTE — TOC Initial Note (Signed)
 Transition of Care [facility]) - Initial/Assessment Note    Patient Details  Name: Destiny Elliott MRN: [id number] Date of Birth: [birth year]  Transition of Care [facility]) CM/SW Contact:    Sudie Erminio Deems, RN Phone Number: 10/04/2024, 2:39 PM  Clinical Narrative: Patient presented for atrial fib. PTA patient was from home with spouse. Patient will have support of daughter once she gets home. Patient in need of DME rolling walker and bedside commode. Daughter did not have an agency preference for agency and is using [employer]- DME has been delivered to the room. Clinicals submitted to [facility] via the Hub and accepted. [facility] to reach out to family within 24-48 hours post transition home. No further needs identified at this time.                    Expected Discharge Plan: Home w Home Health Services Barriers to Discharge: No Barriers Identified   Patient Goals and CMS Choice Patient states their goals for this hospitalization and ongoing recovery are:: Plan to return home once stable   Choice offered to / list presented to : Adult Children (Daughter did not have any agency preferences.)      Expected Discharge Plan and Services In-house Referral: NA Discharge Planning Services: CM Consult Post Acute Care Choice: Home Health, Durable Medical Equipment Living arrangements for the past 2 months: Single Family Home Expected Discharge Date: 10/04/24               DME Arranged: Vannie rolling, Bedside commode DME Agency: [employer] Date DME Agency Contacted: 10/04/24 Time DME Agency Contacted: 1400 Representative spoke with at DME Agency: [facility] HH Arranged: PT HH Agency: [facility] Date [facility] Agency Contacted: 10/04/24 Time HH Agency Contacted: 1420 Representative spoke with at [facility] Agency: [facility]  Prior Living Arrangements/Services Living arrangements for the past 2 months: Single Family Home Lives with:: Self, spouse Patient language and need for interpreter  reviewed:: Yes Do you feel safe going back to the place where you live?: Yes      Need for Family Participation in Patient Care: Yes (Comment) Care giver support system in place?: Yes (comment)   Criminal Activity/Legal Involvement Pertinent to Current Situation/Hospitalization: No - Comment as needed  Activities of Daily Living   ADL Screening (condition at time of admission) Independently performs ADLs?: Yes (appropriate for developmental age) Is the patient deaf or have difficulty hearing?: Yes Does the patient have difficulty seeing, even when wearing glasses/contacts?: No Does the patient have difficulty concentrating, remembering, or making decisions?: No  Permission Sought/Granted Permission sought to share information with : Family Supports, Magazine Features Editor, Case Estate Manager/land Agent granted to share information with : Yes, Verbal Permission Granted     Permission granted to share info w AGENCY: [employer] and [facility]        Emotional Assessment Appearance:: Appears stated age Attitude/Demeanor/Rapport: Engaged Affect (typically observed): Appropriate Orientation: : Oriented to Self, Oriented to Place, Oriented to  Time, Oriented to Situation Alcohol / Substance Use: Not Applicable Psych Involvement: No (comment)  Admission diagnosis:  Acute pulmonary edema (HCC) [J81.0] Atrial fibrillation with RVR (HCC) [I48.91] Patient Active Problem List   Diagnosis Date Noted   Cough 10/03/2024   Hyponatremia 10/03/2024   Hypokalemia 10/03/2024   Rash due to allergy 10/03/2024   Seizure (HCC) 10/02/2024   AKI (acute kidney injury) 10/02/2024   Acute on chronic diastolic CHF (congestive heart failure) (HCC) 09/28/2024   Elevated troponin 09/28/2024   Acute hypoxic  respiratory failure (HCC) 09/28/2024   Depression 09/28/2024   Atrial fibrillation with RVR (HCC) 09/27/2024   Essential hypertension    A-fib (HCC) 09/26/2024   Bipolar disorder (HCC) 03/20/2018    Bipolar disorder, unspecified (HCC) 09/26/2017   PCP:  Verena Mems, MD Pharmacy:   [facility] [id number] - [address] [place] - [address] [place] [address] Phone: [phone] Fax: [phone]     Social Drivers of Health (SDOH) Social History: SDOH Screenings   Food Insecurity: No Food Insecurity (09/28/2024)  Housing: Low Risk  (09/28/2024)  Transportation Needs: No Transportation Needs (09/28/2024)  Utilities: Not At Risk (09/28/2024)  Alcohol Screen: Low Risk  (03/20/2018)  Social Connections: Moderately Integrated (09/28/2024)  Tobacco Use: Medium Risk (10/02/2024)   SDOH Interventions:     Readmission Risk Interventions     No data to display

## 2024-10-04 NOTE — Progress Notes (Signed)
 Ordering BMET to be checked in 1 week post discharge and echocardiogram to be completed 01/15/2024  Results to Dr. Kate

## 2024-10-04 NOTE — Progress Notes (Signed)
 Rounding Note   Patient Name: Destiny Elliott Date of Encounter: 10/04/2024  [facility] Cardiologist: Lonni LITTIE Nanas, MD   Subjective Pt denies CP or dyspnea  Scheduled Meds:  amiodarone   200 mg Oral BID   apixaban   5 mg Oral BID   ARIPiprazole   2 mg Oral Daily   azithromycin   500 mg Oral Daily   diphenhydrAMINE -zinc  acetate   Topical BID   diphenhydrAMINE -zinc  acetate   Topical BID   levETIRAcetam   500 mg Oral BID   metoprolol  succinate  25 mg Oral Daily   mupirocin  ointment  1 Application Nasal BID   sacubitril -valsartan   1 tablet Oral BID   spironolactone   12.5 mg Oral Daily   Continuous Infusions:  cefTRIAXone  (ROCEPHIN )  IV 2 g (10/03/24 1709)   PRN Meds: acetaminophen  **OR** acetaminophen , chlorpheniramine-HYDROcodone, guaiFENesin , melatonin, ondansetron  (ZOFRAN ) IV   Vital Signs  Vitals:   10/03/24 1933 10/03/24 2334 10/04/24 0500 10/04/24 0535  BP: 138/80 113/66  123/89  Pulse: 75 66  74  Resp: 15 19  (!) 23  Temp: 98.2 F (36.8 C) 98.3 F (36.8 C) 97.9 F (36.6 C) 97.8 F (36.6 C)  TempSrc: Oral Oral Oral Oral  SpO2: 96% 96%  93%  Weight:    54.3 kg  Height:        Intake/Output Summary (Last 24 hours) at 10/04/2024 0806 Last data filed at 10/03/2024 1500 Gross per 24 hour  Intake 370.92 ml  Output 400 ml  Net -29.08 ml       10/04/2024    5:35 AM 10/03/2024    4:24 AM 10/02/2024    4:05 AM  Last 3 Weights  Weight (lbs) 119 lb 12.8 oz 122 lb 12.7 oz 114 lb 13.8 oz  Weight (kg) 54.341 kg 55.7 kg 52.1 kg      Telemetry Sinus with PACs- Personally Reviewed    Physical Exam  GEN: NAD Neck: supple, no JVD Cardiac: RRR, no rub Respiratory: CTA, no wheeze GI: Soft, NT/ND MS: No edema Neuro:  No focal findings` Psych: Normal affect   Labs High Sensitivity Troponin:   Recent Labs  Lab 09/27/24 1923 09/27/24 2139 09/28/24 0053 09/28/24 0445  TROPONINIHS 27* 42* 53* 53*     Chemistry Recent Labs  Lab  09/29/24 0540 09/30/24 0630 10/01/24 0558 10/02/24 0430 10/03/24 0401 10/04/24 0410  NA 130* 129* 129* 133* 133* 134*  K 3.9 3.2* 4.0 4.0 4.3 3.6  CL 93* 89* 94* 94* 96* 98  CO2 25 26 22 24 27 23   GLUCOSE 118* 97 106* 79 86 101*  BUN 21 28* 44* 28* 17 10  CREATININE 0.95 1.39* 1.85* 1.21* 1.00 0.78  CALCIUM 8.1* 7.9* 7.9* 8.4* 7.9* 8.3*  MG 2.4 2.2  --  2.3  --   --   PROT 6.3*  --  5.7* 6.6  --   --   ALBUMIN  3.3*  --  3.3* 3.6  --   --   AST 60*  --  43* 41  --   --   ALT 32  --  19 21  --   --   ALKPHOS 87  --  62 59  --   --   BILITOT 1.7*  --  0.9 0.7  --   --   GFRNONAA >60 40* 28* 47* 59* >60  ANIONGAP 12 14 13 15 10 13      Hematology Recent Labs  Lab 10/02/24 0430 10/03/24 0401 10/04/24 0410  WBC 2.8*  2.8* 3.0*  RBC 4.32 3.85* 4.48  HGB 12.6 11.1* 13.1  HCT 37.1 33.8* 37.7  MCV 85.9 87.8 84.2  MCH 29.2 28.8 29.2  MCHC 34.0 32.8 34.7  RDW 13.4 13.4 13.2  PLT 195 170 208   Thyroid   Recent Labs  Lab 09/27/24 1923  TSH 1.129    BNP Recent Labs  Lab 09/28/24 0606  BNP 780.2*     Radiology  ECHO TEE Result Date: 10/02/2024    TRANSESOPHOGEAL ECHO REPORT   Patient Name:   Destiny Elliott Date of Exam: 10/02/2024 Medical Rec #:  [id number]         Height:       63.0 in Accession #:    [id number]        Weight:       114.9 lb Date of Birth:  04-14-1951         BSA:          1.528 m Patient Age:    73 years          BP:           128/78 mmHg Patient Gender: F                 HR:           123 bpm. Exam Location:  Inpatient Procedure: Transesophageal Echo and with cardioversion (Both Spectral and Color            Flow Doppler were utilized during procedure). Indications:    Afib  History:        Patient has prior history of Echocardiogram examinations.                 Arrythmias:Atrial Fibrillation.  Sonographer:    Damien Senior Referring Phys: [phone] Nazifa Trinka S Jessicca Stitzer PROCEDURE: The transesophogeal probe was passed without difficulty through the esophogus of the  patient. Sedation performed by different physician. The patient developed no complications during the procedure.  IMPRESSIONS  1. No LAA thormbus. On Rx eliquis  DCC x 1 with 200 J biphasic Converted to NSR with PAC;s IV amiodarone  continued.  2. EF estimated while in rapid afib rate 124 bpm. Left ventricular ejection fraction, by estimation, is 35 to 40%. The left ventricle has moderately decreased function. The left ventricle demonstrates global hypokinesis.  3. Right ventricular systolic function is normal. The right ventricular size is normal.  4. Left atrial size was moderately dilated. No left atrial/left atrial appendage thrombus was detected.  5. Right atrial size was moderately dilated.  6. The mitral valve is abnormal. Moderate mitral valve regurgitation.  7. Abnormal with multiple filamentous structures coming off right cusp and commisure between left and no cusp. ? Lambl's excrescences. Does not look like vegetation or papilloma. No significant structural damage to valve Longest measured filament 0.8 cm. The aortic valve is tricuspid. Aortic valve regurgitation is not visualized. No aortic stenosis is present.  8. 3D performed of the aortic valve and demonstrates Abnormal AV. FINDINGS  Left Ventricle: EF estimated while in rapid afib rate 124 bpm. Left ventricular ejection fraction, by estimation, is 35 to 40%. The left ventricle has moderately decreased function. The left ventricle demonstrates global hypokinesis. The left ventricular internal cavity size was normal in size. Right Ventricle: The right ventricular size is normal. Right vetricular wall thickness was not assessed. Right ventricular systolic function is normal. Left Atrium: Left atrial size was moderately dilated. No left atrial/left atrial appendage thrombus was detected. Right Atrium: Right  atrial size was moderately dilated. Pericardium: There is no evidence of pericardial effusion. Mitral Valve: The mitral valve is abnormal. Moderate  mitral valve regurgitation. Tricuspid Valve: The tricuspid valve is normal in structure. Tricuspid valve regurgitation is mild. Aortic Valve: Abnormal with multiple filamentous structures coming off right cusp and commisure between left and no cusp. ? Lambl's excrescences. Does not look like vegetation or papilloma. No significant structural damage to valve Longest measured filament 0.8 cm. The aortic valve is tricuspid. Aortic valve regurgitation is not visualized. No aortic stenosis is present. Pulmonic Valve: The pulmonic valve was normal in structure. Pulmonic valve regurgitation is trivial. Aorta: The aortic root is normal in size and structure. IAS/Shunts: No atrial level shunt detected by color flow Doppler. Additional Comments: No LAA thormbus. On Rx eliquis  DCC x 1 with 200 J biphasic Converted to NSR with PAC;s IV amiodarone  continued. 3D was performed not requiring image post processing on an independent workstation and was abnormal. AORTIC VALVE LVOT Vmax:   58.50 cm/s LVOT Vmean:  42.400 cm/s LVOT VTI:    0.074 m  SHUNTS Systemic VTI: 0.07 m Maude Emmer MD Electronically signed by Maude Emmer MD Signature Date/Time: 10/02/2024/1:31:52 PM    Final    EP STUDY Result Date: 10/02/2024 See surgical note for result.   Patient Profile   Elonda Giuliano is a 73 y.o. female with a history of paroxysmal atrial fibrillation/ flutter on Eliquis , external SVT, hypertension, hyperlipidemia, and bipolar disorder who is being seen 09/28/2024 for the evaluation of atrial flutter with RVR.  Echocardiogram this admission shows ejection fraction 35%, mild left ventricular hypertrophy, mild RV dysfunction, mild mitral regurgitation.  LV function newly reduced compared to echocardiogram March 2025.  Assessment & Plan  1 atrial fibrillation/flutter-patient remains in sinus rhythm this morning.  Will change amiodarone  to 200 mg daily and continue Toprol . Recent MRI shows CVA and amyloid angiopathy with  increased risk of intracranial hemorrhage.  Continue apixaban  for now.  She will be seen as an outpatient for consideration of Watchman device given increased risk of intracranial hemorrhage based on amyloid angiopathy.  Ultimately would plan to continue amiodarone  for approximately 8-12 weeks and then discontinue given her young age.  Hopefully atrial arrhythmias were precipitated by influenza.  Can also consider addition of flecainide  or referral for ablation in the future.  2 cardiomyopathy-etiology unclear.  Question tachycardia mediated.  Heart rate now improved status post TEE guided cardioversion.  Continue Toprol , Entresto  and spironolactone .  Medications can be titrated as an outpatient. Plan to repeat echocardiogram in 3-6 months to see if LV function has improved now that sinus has been reestablished.  If not would need ischemia evaluation.  3 acute systolic congestive heart failure-not markedly volume overloaded on examination.  Plan as outlined above.  Will not put on standing Lasix .  4 filamentous strands on aortic side of aortic valve-question Lambl's excrescence.  No aortic insufficiency noted . Will need follow-up of blood cultures as an outpatient  (may be negative as she is on antibiotics at the time of being drawn).  No plans for further intervention for this issue at this point.  Can follow-up as an outpatient.  5 influenza A-per primary service.    6 hypertension-continue metoprolol , Entresto  and spironolactone .  Will adjust based on outpatient readings.  Patient can be discharged from a cardiac standpoint.  Will check potassium and renal function in 1 week.  Plan follow-up echocardiogram in 3 to 6 months.  Follow-up with APP 2 weeks.  Needs appointment with EP for consideration of Watchman device to discontinue apixaban  long-term.  Follow-up with Dr. Kate in 3 months.  For questions or updates, please contact [facility]   Signed, Redell Shallow, MD   10/04/2024, 8:06 AM

## 2024-10-05 ENCOUNTER — Other Ambulatory Visit (HOSPITAL_COMMUNITY): Payer: Self-pay

## 2024-10-08 LAB — CULTURE, BLOOD (ROUTINE X 2)
Culture: NO GROWTH
Culture: NO GROWTH
Special Requests: ADEQUATE
Special Requests: ADEQUATE

## 2024-10-09 ENCOUNTER — Encounter (HOSPITAL_COMMUNITY): Payer: Self-pay

## 2024-10-09 ENCOUNTER — Emergency Department (HOSPITAL_COMMUNITY)
Admission: EM | Admit: 2024-10-09 | Discharge: 2024-10-09 | Disposition: A | Discharge status: Home / Self Care | Attending: Emergency Medicine | Admitting: Emergency Medicine

## 2024-10-09 ENCOUNTER — Other Ambulatory Visit: Payer: Self-pay

## 2024-10-09 DIAGNOSIS — Z7901 Long term (current) use of anticoagulants: Secondary | ICD-10-CM | POA: Diagnosis not present

## 2024-10-09 DIAGNOSIS — R000 Tachycardia, unspecified: Secondary | ICD-10-CM | POA: Diagnosis present

## 2024-10-09 DIAGNOSIS — I4892 Unspecified atrial flutter: Secondary | ICD-10-CM | POA: Diagnosis not present

## 2024-10-09 DIAGNOSIS — I499 Cardiac arrhythmia, unspecified: Secondary | ICD-10-CM | POA: Diagnosis not present

## 2024-10-09 DIAGNOSIS — I4891 Unspecified atrial fibrillation: Secondary | ICD-10-CM | POA: Diagnosis not present

## 2024-10-09 DIAGNOSIS — R42 Dizziness and giddiness: Secondary | ICD-10-CM | POA: Diagnosis not present

## 2024-10-09 LAB — BASIC METABOLIC PANEL WITH GFR
Anion gap: 13 (ref 5–15)
BUN: 20 mg/dL (ref 8–23)
CO2: 22 mmol/L (ref 22–32)
Calcium: 8.7 mg/dL — ABNORMAL LOW (ref 8.9–10.3)
Chloride: 99 mmol/L (ref 98–111)
Creatinine, Ser: 1.01 mg/dL — ABNORMAL HIGH (ref 0.44–1.00)
GFR, Estimated: 59 mL/min — ABNORMAL LOW (ref >60)
Glucose, Bld: 89 mg/dL (ref 70–99)
Potassium: 4.4 mmol/L (ref 3.5–5.1)
Sodium: 134 mmol/L — ABNORMAL LOW (ref 135–145)

## 2024-10-09 LAB — CBC
HCT: 40 % (ref 36.0–46.0)
Hemoglobin: 13.4 g/dL (ref 12.0–15.0)
MCH: 29.1 pg (ref 26.0–34.0)
MCHC: 33.5 g/dL (ref 30.0–36.0)
MCV: 87 fL (ref 80.0–100.0)
Platelets: 406 K/uL — ABNORMAL HIGH (ref 150–400)
RBC: 4.6 MIL/uL (ref 3.87–5.11)
RDW: 13.2 % (ref 11.5–15.5)
WBC: 5.7 K/uL (ref 4.0–10.5)
nRBC: 0 % (ref 0.0–0.2)

## 2024-10-09 LAB — URINALYSIS, ROUTINE W REFLEX MICROSCOPIC
Bilirubin Urine: NEGATIVE
Glucose, UA: NEGATIVE mg/dL
Hgb urine dipstick: NEGATIVE
Ketones, ur: NEGATIVE mg/dL
Leukocytes,Ua: NEGATIVE
Nitrite: NEGATIVE
Protein, ur: NEGATIVE mg/dL
Specific Gravity, Urine: 1.004 — ABNORMAL LOW (ref 1.005–1.030)
pH: 5 (ref 5.0–8.0)

## 2024-10-09 LAB — MAGNESIUM: Magnesium: 2 mg/dL (ref 1.7–2.4)

## 2024-10-09 LAB — I-STAT CHEM 8, ED
BUN: 25 mg/dL — ABNORMAL HIGH (ref 8–23)
Calcium, Ion: 1.12 mmol/L — ABNORMAL LOW (ref 1.15–1.40)
Chloride: 97 mmol/L — ABNORMAL LOW (ref 98–111)
Creatinine, Ser: 1 mg/dL (ref 0.44–1.00)
Glucose, Bld: 86 mg/dL (ref 70–99)
HCT: 41 % (ref 36.0–46.0)
Hemoglobin: 13.9 g/dL (ref 12.0–15.0)
Potassium: 4.4 mmol/L (ref 3.5–5.1)
Sodium: 134 mmol/L — ABNORMAL LOW (ref 135–145)
TCO2: 24 mmol/L (ref 22–32)

## 2024-10-09 MED ORDER — PROPOFOL 10 MG/ML IV BOLUS
0.5000 mg/kg | Freq: Once | INTRAVENOUS | Status: DC
Start: 2024-10-09 — End: 2024-10-10

## 2024-10-09 MED ORDER — METOPROLOL TARTRATE 5 MG/5ML IV SOLN
5.0000 mg | INTRAVENOUS | Status: DC | PRN
  Administered 2024-10-09 (×2): 5 mg via INTRAVENOUS
  Filled 2024-10-09 (×2): qty 5

## 2024-10-09 NOTE — Discharge Instructions (Signed)
 You had a fast irregular heart rate today called atrial flutter.  You were treated with metoprolol  by IV to help slow the heart rate.  I discussed with cardiology, recommendations.  You should keep your appointment next Thursday.  In the meantime, double your dose of amiodarone  to 400mg  in the morning.  Please take 200 mg of amiodarone  tonight with your evening medications, and then start the higher dose tomorrow morning.   If you feel more dizzy, lightheaded, develop shortness of breath or chest pain --return to the emergency department.  It is very important that you continue to take your blood thinner as this is helping to protect you from a stroke.  Continue metoprolol  at 25 mg daily.

## 2024-10-09 NOTE — ED Provider Notes (Addendum)
 [place] EMERGENCY DEPARTMENT AT [facility] - Inpatient Provider Note   CSN: [id number] Arrival date & time:        Patient presents with: No chief complaint on file.   Destiny Elliott is a 73 y.o. female.   Patient with history of atrial fibrillation presents to the emergency department with atrial flutter with RVR.  Patient was admitted to the hospital 11/14 - 10/04/2024 for A-fib with RVR, fluid overload, shortness of breath.  During her stay she had a stroke diagnosed on MRI in the right parietal lobe, postcentral gyrus, possible infarct in the right frontal lobe.  Patient underwent TEE and had cardioversion on 11/19.  She was discharged home on metoprolol , amiodarone .  She is on daily apixaban .  Patient states that around 8 AM today she felt dizzy.  She describes this as an imbalance.  This was similar to what happened when she was admitted last time.  She denies lightheadedness.  No associated chest pain or shortness of breath today.  She took her apixaban  at 10 AM today, denies any missed doses.  EMS was called from her PCP for transport.  She was given 500 cc fluid and route.  She had a flutter at around rate of 130.          Prior to Admission medications   Medication Sig Start Date End Date Taking? Authorizing Provider  amiodarone  (PACERONE ) 200 MG tablet Take 1 tablet (200 mg total) by mouth daily. 10/05/24   Cox, Amy N, DO  apixaban  (ELIQUIS ) 5 MG TABS tablet Take 1 tablet (5 mg total) by mouth 2 (two) times daily. 09/06/24   Mealor, Augustus E, MD  ARIPiprazole  (ABILIFY ) 2 MG tablet Take 2 mg by mouth daily. 07/19/24   [provider]  levETIRAcetam  (KEPPRA ) 500 MG tablet Take 1 tablet (500 mg total) by mouth 2 (two) times daily. 10/04/24   Cox, Amy N, DO  metoprolol  succinate (TOPROL -XL) 25 MG 24 hr tablet Take 1 tablet (25 mg total) by mouth daily. 10/05/24   Cox, Amy N, DO  sacubitril -valsartan  (ENTRESTO ) 24-26 MG Take 1 tablet by mouth 2 (two) times daily.  10/04/24   Cox, Amy N, DO  sodium chloride  (OCEAN) 0.65 % SOLN nasal spray Place into both nostrils daily.    [provider]  spironolactone  (ALDACTONE ) 25 MG tablet Take 0.5 tablets (12.5 mg total) by mouth daily. 10/05/24   Cox, Amy N, DO    Allergies: Ambien [zolpidem tartrate], Gabapentin, Nickel, Nortriptyline, Paxil [paroxetine hcl], Remeron [mirtazapine], Trazodone  hcl, Zoloft [sertraline hcl], Zolpidem, Amlodipine besylate, Benadryl  [diphenhydramine  hcl], Buspar [buspirone], Dextromethorphan-guaifenesin , Flonase [fluticasone propionate], Nirmatrelvir-ritonavir, Tylenol  pm extra [diphenhydramine -apap (sleep)], and Vitamin d  analogs    Review of Systems  Updated Vital Signs LMP 10/14/2002 (Approximate)   Physical Exam Vitals and nursing note reviewed.  Constitutional:      Appearance: She is well-developed. She is not diaphoretic.  HENT:     Head: Normocephalic and atraumatic.     Nose: Nose normal.     Mouth/Throat:     Mouth: Mucous membranes are moist. Mucous membranes are not dry.  Eyes:     Conjunctiva/sclera: Conjunctivae normal.  Neck:     Vascular: Normal carotid pulses. No JVD.     Trachea: Trachea normal. No tracheal deviation.  Cardiovascular:     Rate and Rhythm: Regular rhythm. Tachycardia present.     Pulses: No decreased pulses.          Radial pulses are 2+ on the right  side and 2+ on the left side.     Heart sounds: Normal heart sounds, S1 normal and S2 normal. No murmur heard. Pulmonary:     Effort: Pulmonary effort is normal. No respiratory distress.     Breath sounds: No wheezing.  Chest:     Chest wall: No tenderness.  Abdominal:     General: Bowel sounds are normal.     Palpations: Abdomen is soft.     Tenderness: There is no abdominal tenderness. There is no guarding or rebound.  Musculoskeletal:        General: Normal range of motion.     Cervical back: Normal range of motion and neck supple. No muscular tenderness.  Skin:     General: Skin is warm and dry.     Coloration: Skin is not pale.  Neurological:     Mental Status: She is alert.     (all labs ordered are listed, but only abnormal results are displayed) Labs Reviewed  BASIC METABOLIC PANEL WITH GFR - Abnormal; Notable for the following components:      Result Value   Sodium 134 (*)    Creatinine, Ser 1.01 (*)    Calcium 8.7 (*)    GFR, Estimated 59 (*)    All other components within normal limits  CBC - Abnormal; Notable for the following components:   Platelets 406 (*)    All other components within normal limits  URINALYSIS, ROUTINE W REFLEX MICROSCOPIC - Abnormal; Notable for the following components:   Color, Urine STRAW (*)    Specific Gravity, Urine 1.004 (*)    All other components within normal limits  I-STAT CHEM 8, ED - Abnormal; Notable for the following components:   Sodium 134 (*)    Chloride 97 (*)    BUN 25 (*)    Calcium, Ion 1.12 (*)    All other components within normal limits  MAGNESIUM     ED ECG REPORT   Date: 10/09/2024  Rate: 127  Rhythm: atrial flutter  QRS Axis: normal  Intervals: normal  ST/T Wave abnormalities: normal  Conduction Disutrbances:none  Narrative Interpretation:   Old EKG Reviewed: changes noted from 11/19  I have personally reviewed the EKG tracing and disagree with the computerized printout as noted.   Radiology: No results found.   Procedures   Medications Ordered in the ED  propofol  (DIPRIVAN ) 10 mg/mL bolus/IV push 27.2 mg (has no administration in time range)  metoprolol  tartrate (LOPRESSOR ) injection 5 mg (5 mg Intravenous Given 10/09/24 1826)   ED Course  Patient seen and examined. History obtained directly from patient.  Patient discussed with and seen by at bedside with Dr. Levander.  Labs/EKG: Ordered CBC, BMP, magnesium .  Imaging: None ordered  Medications/Fluids: None ordered  Most recent vital signs reviewed and are as follows: LMP 10/14/2002 (Approximate)   Initial  impression: Recurrent atrial flutter with RVR.  Discussing possibility of cardioversion.  2:20 PM Dr. Levander discussed possible cardioversion with patient, patient currently declining.  Vital signs reviewed, IV metoprolol  ordered.  BP (!) 128/104   Pulse (!) 127   Temp 98.6 F (37 C) (Oral)   Resp 17   Ht 5' 2 (1.575 m)   Wt 54.4 kg   LMP 10/14/2002 (Approximate)   SpO2 99%   BMI 21.95 kg/m   4:09 PM Reassessment performed. Patient appears stable, comfortable. HR 100-115 after metoprolol .   Labs personally reviewed and interpreted including: CBC with platelets of 406, otherwise unremarkable; BMP sodium 134  at recent baseline, creatinine 1.01 at recent baseline, calcium 8.7 slightly improved from recent baseline; magnesium  at 2.0 with normal; UA without signs of infection.  Reviewed pertinent lab work and imaging with patient at bedside. Questions answered.   Most current vital signs reviewed and are as follows: BP (!) 120/90   Pulse (!) 122   Temp 98.6 F (37 C) (Oral)   Resp 18   Ht 5' 2 (1.575 m)   Wt 54.4 kg   LMP 10/14/2002 (Approximate)   SpO2 99%   BMI 21.95 kg/m   Plan: request cards reccs  4:53 PM I spoke with Dr. Anner. He reviewed recent hospitalization notes.  Reports patient has follow-up in 8 days.  Recommends increasing amiodarone  from 200 to 400 mg daily until Monday.  If heart rates are still elevated, continue until her appointment.  7:40 PM Reassessment performed. Patient appears stable, requesting discharge.  Her heart rate did creep back up and she was given an additional dose of metoprolol  around 6:30 PM.  She has done well with this.  On my last recheck, heart rate was about 104.  No lightheadedness.  Patient is ambulated without difficulty.  She will take an additional dose of amiodarone  200 tonight and then increase to 400 tomorrow morning.  Reviewed pertinent lab work and imaging with patient at bedside. Questions answered.   Most current vital  signs reviewed and are as follows: BP 103/80   Pulse 75   Temp 98.4 F (36.9 C) (Oral)   Resp (!) 22   Ht 5' 2 (1.575 m)   Wt 54.4 kg   LMP 10/14/2002 (Approximate)   SpO2 98%   BMI 21.95 kg/m   Plan: Discharge to home.   Prescriptions written for: None  Other home care instructions discussed: Monitoring of symptoms  ED return instructions discussed: Return with development of chest pain, shortness of breath, severe dizziness, general worsening.  Follow-up instructions discussed: Patient encouraged to follow-up with cardiology in 1 week as planned.  CRITICAL CARE Performed by: Fonda Ruby PA-C Total critical care time: 45 minutes Critical care time was exclusive of separately billable procedures and treating other patients. Critical care was necessary to treat or prevent imminent or life-threatening deterioration. Critical care was time spent personally by me on the following activities: development of treatment plan with patient and/or surrogate as well as nursing, discussions with consultants, evaluation of patient's response to treatment, examination of patient, obtaining history from patient or surrogate, ordering and performing treatments and interventions, ordering and review of laboratory studies, ordering and review of radiographic studies, pulse oximetry and re-evaluation of patient's condition.                                   Medical Decision Making Amount and/or Complexity of Data Reviewed Labs: ordered.  Risk Prescription drug management.   Patient presented with atrial flutter with RVR today.  Her symptoms were overall mild, just some nonspecific dizziness.  No chest pain or shortness of breath.  Patient had TEE last week, but seems to have gone into atrial flutter today.  We offered cardioversion, but patient declines.  She was given IV metoprolol , 2 doses, with good control of heart rate.  Cardiology recommendations obtained.  Patient will continue her  anticoagulation.  Will temporarily increase amiodarone .  Patient back to baseline, feels well, eager for discharge.  Lab workup is otherwise unremarkable.  The patient's vital signs,  pertinent lab work and imaging were reviewed and interpreted as discussed in the ED course. Hospitalization was considered for further testing, treatments, or serial exams/observation. However as patient is well-appearing, has a stable exam, and reassuring studies today, I do not feel that they warrant admission at this time. This plan was discussed with the patient who verbalizes agreement and comfort with this plan and seems reliable and able to return to the Emergency Department with worsening or changing symptoms.       Final diagnoses:  Atrial flutter with rapid ventricular response [facility])    ED Discharge Orders     None          Desiderio Chew, PA-C 10/09/24 1944    Levander Houston, MD 10/28/24 1137

## 2024-10-09 NOTE — ED Triage Notes (Signed)
 According to [facility]: Pt is coming from [facility], pt awoke around 0900 and was feeling dizzy was sent to [facility] by primary provider.SABRA Upon performing an EKG pt was found to be in aflutter. Pt diagnosed with afib 10 days ago where she had to be cardioverter. Pt was started on eliquis  afterworlds. She has also recently been started on keppra  due to a seizure during last hospital admission for the cardioversion.  Aox4, pt has a 20 gauge piv in right hand and has had 500 ml of normal saline.  Vitals 140/100 130 hr in sinus tact to aflutter rhythm 20 RR Spo 95% on room air.

## 2024-10-09 NOTE — ED Notes (Signed)
 Pt was ambulated by both techs

## 2024-10-15 DIAGNOSIS — Z5189 Encounter for other specified aftercare: Secondary | ICD-10-CM | POA: Diagnosis not present

## 2024-10-15 DIAGNOSIS — I4892 Unspecified atrial flutter: Secondary | ICD-10-CM | POA: Diagnosis not present

## 2024-10-15 DIAGNOSIS — R748 Abnormal levels of other serum enzymes: Secondary | ICD-10-CM | POA: Diagnosis not present

## 2024-10-15 DIAGNOSIS — Z8673 Personal history of transient ischemic attack (TIA), and cerebral infarction without residual deficits: Secondary | ICD-10-CM | POA: Diagnosis not present

## 2024-10-15 DIAGNOSIS — E871 Hypo-osmolality and hyponatremia: Secondary | ICD-10-CM | POA: Diagnosis not present

## 2024-10-15 NOTE — Progress Notes (Unsigned)
 Cardiology Office Note   Date:  10/17/2024  ID:  Siriyah Ambrosius, DOB 04/24/1951, MRN [id number] PCP: [facility]  [facility] Providers Cardiologist:  Lonni LITTIE Nanas, MD Electrophysiologist:  Eulas FORBES Furbish, MD   History of Present Illness Quentina Fronek is a 73 y.o. 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  Presents today for hospital follow-up appointment.  Patient is a 73 year old female with above medical history, followed by Dr. Barnetta and Dr. Furbish.  Previously wore cardiac monitor in 12/2023 that showed 1925 brief episodes of SVT and frequent PACs.  Echo in 01/2024 showed EF 60-65%, grade 2 diastolic dysfunction, normal RV size and function with moderately elevated PSAP, severe left atrial enlargement, mild MR.  Found to be in atrial fibrillation/flutter at an office visit with Dr. Furbish on 09/02/2024.  Atenolol  was increased and patient was started on Eliquis  with plans to do cardioversion after 3 weeks uninterrupted anticoagulation.  She was seen in the A-fib clinic on 09/26/2024, at which time she was in atrial flutter with rates in the 130s.  Outpatient [facility] was arranged for 09/30/2024.    However, patient presented to the ED on the evening of 09/27/2024 due to shortness of breath.  EKG showed atrial fib/flutter with heart rate 127 bpm.  BNP elevated to 780.  Chest x-ray showed cardiomegaly with bilateral pleural effusions suggestive of CHF.  She was given IV Lasix  in the ED which helped her breathing, however she continued to be tachycardic.  Repeat EKG showed SVT.  Was transition to metoprolol .  Echocardiogram on 09/29/2024 showed EF 35% with mild LVH, mildly reduced RV systolic function, mild mitral valve regurgitation due to mild prolapse of the anterior mitral valve leaflet.  Patient was started on IV amiodarone .  During her admission she  had possible seizure-like activity.  Underwent brain MRI that showed a recent CVA and amyloid and angiopathy with increased risk of intracranial hemorrhage.  Neurology initially wanted to delay cardioversion but patient remained tachycardic.  She underwent TEE guided cardioversion on 10/02/2024 with successful cardioversion to normal sinus rhythm with PACs.  Plan was for amiodarone  for 8 weeks then discontinue it given young age.  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  Discharged on amiodarone , metoprolol , Eliquis , Entresto , spironolactone .  Recommended repeat echo in 3-6 months, referral for watchman consideration given increased risk of intracranial hemorrhage, 8-12 weeks of amiodarone  with plans to eventually discontinue.  Patient seen in the ED on 10/09/2024 with dizziness, found to be back in atrial flutter with RVR.  She was offered cardioversion but patient refused.  She was given IV metoprolol  with improvement heart rate.  Her amiodarone  was increased to 400 mg daily.  Today, patient presents for a follow-up appointment.  She has been feeling well overall.  Denies chest pain, shortness of breath, palpitations, dizziness, syncope, near syncope.  Her daughter helps keep track of her blood pressure and her heart rate.  Notes that her heart rate has been elevated at home.  In the 110s-120s.  Patient seems to be asymptomatic with this.  She occasionally has low BPs as well, as low as the 90s systolic.  She denies lower extremity swelling.  Currently on amiodarone , Eliquis , metoprolol , Entresto , spironolactone .  She has not taken her Entresto  for the past 2 days due to low blood pressures.  BP today 122/88.  Studies Reviewed     Cardiac Studies & Procedures  ______________________________________________________________________________________________     ECHOCARDIOGRAM  ECHOCARDIOGRAM COMPLETE 09/29/2024  Narrative ECHOCARDIOGRAM REPORT    Patient Name:   Jameila BABY GIEGER  Date of Exam: 09/29/2024 Medical Rec #:  [id number]         Height:       63.0 in Accession #:    [id number]        Weight:       117.7 lb Date of Birth:  August 24, 1951         BSA:          1.544 m Patient Age:    73 years          BP:           115/91 mmHg Patient Gender: F                 HR:           127 bpm. Exam Location:  Inpatient  Procedure: 2D Echo, Cardiac Doppler and Color Doppler (Both Spectral and Color Flow Doppler were utilized during procedure).  Indications:    I50.40* Unspecified combined systolic (congestive) and diastolic (congestive) heart failure  History:        Patient has prior history of Echocardiogram examinations. CHF, Abnormal ECG, Arrythmias:Atrial Fibrillation; Signs/Symptoms:Dyspnea and Shortness of Breath.  Sonographer:    Ellouise Mose RDCS Referring Phys: [id number] EVA KATHEE PORE   Sonographer Comments: Technically difficult study due to poor echo windows. Patient appears to have AMS, could not recall her DOB. HR has been high since previous day. IMPRESSIONS   1. Patient tachycardic throughout exam. 2. Left ventricular ejection fraction, by estimation, is 35%. Left ventricular ejection fraction by 2D MOD biplane is 35.6 %. The left ventricle has moderately decreased function. The left ventricle demonstrates global hypokinesis. There is mild concentric left ventricular hypertrophy. Diastology indeterminate due to atrail arrhythmia. 3. Right ventricular systolic function is mildly reduced. The right ventricular size is normal. Tricuspid regurgitation signal is inadequate for assessing PA pressure. 4. There is mild prolapse of the anterior mitral valve leaflet. The mitral valve is abnormal. Mild mitral valve regurgitation. No evidence of mitral stenosis. There is mild prolapse of of the mitral valve. 5. The aortic valve is tricuspid. Aortic valve regurgitation is not visualized. No aortic stenosis is present. 6. The inferior vena cava is dilated in size with  >50% respiratory variability, suggesting right atrial pressure of 8 mmHg.  Comparison(s): Changes from prior study are noted. Newly reduced LV systolic function.  FINDINGS Left Ventricle: Left ventricular ejection fraction, by estimation, is 35%. Left ventricular ejection fraction by 2D MOD biplane is 35.6 %. The left ventricle has moderately decreased function. The left ventricle demonstrates global hypokinesis. The left ventricular internal cavity size was normal in size. There is mild concentric left ventricular hypertrophy. Diastology indeterminate due to atrail arrhythmia.  Right Ventricle: The right ventricular size is normal. No increase in right ventricular wall thickness. Right ventricular systolic function is mildly reduced. Tricuspid regurgitation signal is inadequate for assessing PA pressure.  Left Atrium: Left atrial size was normal in size.  Right Atrium: Right atrial size was normal in size.  Pericardium: There is no evidence of pericardial effusion.  Mitral Valve: There is mild prolapse of the anterior mitral valve leaflet. The mitral valve is abnormal. There is mild prolapse of of the mitral valve. There is moderate thickening of the anterior and posterior mitral valve leaflet(s). Mild mitral valve regurgitation. No evidence of mitral valve stenosis.  Tricuspid Valve:  The tricuspid valve is normal in structure. Tricuspid valve regurgitation is trivial. No evidence of tricuspid stenosis.  Aortic Valve: The aortic valve is tricuspid. Aortic valve regurgitation is not visualized. No aortic stenosis is present.  Pulmonic Valve: The pulmonic valve was normal in structure. Pulmonic valve regurgitation is trivial. No evidence of pulmonic stenosis.  Aorta: The aortic root and ascending aorta are structurally normal, with no evidence of dilitation.  Venous: The inferior vena cava is dilated in size with greater than 50% respiratory variability, suggesting right atrial pressure of  8 mmHg.  IAS/Shunts: No atrial level shunt detected by color flow Doppler.   LEFT VENTRICLE PLAX 2D                        Biplane EF (MOD) LVIDd:         3.80 cm         LV Biplane EF:   Left LVIDs:         2.80 cm                          ventricular LV PW:         1.80 cm                          ejection LV IVS:        1.40 cm                          fraction by LVOT diam:     2.30 cm                          2D MOD LV SV:         47                               biplane is LV SV Index:   31                               35.6 %. LVOT Area:     4.15 cm  LV Volumes (MOD) LV vol d, MOD    44.9 ml A2C: LV vol d, MOD    31.9 ml A4C: LV vol s, MOD    25.4 ml A2C: LV vol s, MOD    22.5 ml A4C: LV SV MOD A2C:   19.5 ml LV SV MOD A4C:   31.9 ml LV SV MOD BP:    15.0 ml  RIGHT VENTRICLE             IVC RV S prime:     10.80 cm/s  IVC diam: 2.20 cm TAPSE (M-mode): 1.6 cm  LEFT ATRIUM             Index        RIGHT ATRIUM           Index LA diam:        2.60 cm 1.68 cm/m   RA Area:     16.80 cm LA Vol (A2C):   22.7 ml 14.71 ml/m  RA Volume:   44.90 ml  29.09 ml/m LA Vol (A4C):   51.8 ml 33.56 ml/m LA Biplane Vol: 35.2 ml 22.80 ml/m AORTIC VALVE  LVOT Vmax:   79.00 cm/s LVOT Vmean:  53.300 cm/s LVOT VTI:    0.114 m  AORTA Ao Root diam: 3.20 cm Ao Asc diam:  3.40 cm  MITRAL VALVE MV Area (PHT): 4.80 cm    SHUNTS MV Decel Time: 158 msec    Systemic VTI:  0.11 m MV E velocity: 87.50 cm/s  Systemic Diam: 2.30 cm  Georganna Archer Electronically signed by Georganna Archer Signature Date/Time: 09/29/2024/2:32:53 PM    Final   TEE  ECHO TEE 10/02/2024  Narrative TRANSESOPHOGEAL ECHO REPORT    Patient Name:   Stefan SLATER BLUSH Date of Exam: 10/02/2024 Medical Rec #:  [id number]         Height:       63.0 in Accession #:    [id number]        Weight:       114.9 lb Date of Birth:  10-05-1951         BSA:          1.528 m Patient Age:    73 years          BP:            128/78 mmHg Patient Gender: F                 HR:           123 bpm. Exam Location:  Inpatient  Procedure: Transesophageal Echo and with cardioversion (Both Spectral and Color Flow Doppler were utilized during procedure).  Indications:    Afib  History:        Patient has prior history of Echocardiogram examinations. Arrythmias:Atrial Fibrillation.  Sonographer:    Damien Senior Referring Phys: [phone] BRIAN S CRENSHAW  PROCEDURE: The transesophogeal probe was passed without difficulty through the esophogus of the patient. Sedation performed by different physician. The patient developed no complications during the procedure.  IMPRESSIONS   1. No LAA thormbus. On Rx eliquis  DCC x 1 with 200 J biphasic Converted to NSR with PAC;s IV amiodarone  continued. 2. EF estimated while in rapid afib rate 124 bpm. Left ventricular ejection fraction, by estimation, is 35 to 40%. The left ventricle has moderately decreased function. The left ventricle demonstrates global hypokinesis. 3. Right ventricular systolic function is normal. The right ventricular size is normal. 4. Left atrial size was moderately dilated. No left atrial/left atrial appendage thrombus was detected. 5. Right atrial size was moderately dilated. 6. The mitral valve is abnormal. Moderate mitral valve regurgitation. 7. Abnormal with multiple filamentous structures coming off right cusp and commisure between left and no cusp. ? Lambl's excrescences. Does not look like vegetation or papilloma. No significant structural damage to valve Longest measured filament 0.8 cm. The aortic valve is tricuspid. Aortic valve regurgitation is not visualized. No aortic stenosis is present. 8. 3D performed of the aortic valve and demonstrates Abnormal AV.  FINDINGS Left Ventricle: EF estimated while in rapid afib rate 124 bpm. Left ventricular ejection fraction, by estimation, is 35 to 40%. The left ventricle has moderately decreased function.  The left ventricle demonstrates global hypokinesis. The left ventricular internal cavity size was normal in size.  Right Ventricle: The right ventricular size is normal. Right vetricular wall thickness was not assessed. Right ventricular systolic function is normal.  Left Atrium: Left atrial size was moderately dilated. No left atrial/left atrial appendage thrombus was detected.  Right Atrium: Right atrial size was moderately dilated.  Pericardium: There is no evidence of pericardial effusion.  Mitral  Valve: The mitral valve is abnormal. Moderate mitral valve regurgitation.  Tricuspid Valve: The tricuspid valve is normal in structure. Tricuspid valve regurgitation is mild.  Aortic Valve: Abnormal with multiple filamentous structures coming off right cusp and commisure between left and no cusp. ? Lambl's excrescences. Does not look like vegetation or papilloma. No significant structural damage to valve Longest measured filament 0.8 cm. The aortic valve is tricuspid. Aortic valve regurgitation is not visualized. No aortic stenosis is present.  Pulmonic Valve: The pulmonic valve was normal in structure. Pulmonic valve regurgitation is trivial.  Aorta: The aortic root is normal in size and structure.  IAS/Shunts: No atrial level shunt detected by color flow Doppler.  Additional Comments: No LAA thormbus. On Rx eliquis  DCC x 1 with 200 J biphasic Converted to NSR with PAC;s IV amiodarone  continued. 3D was performed not requiring image post processing on an independent workstation and was abnormal.  AORTIC VALVE LVOT Vmax:   58.50 cm/s LVOT Vmean:  42.400 cm/s LVOT VTI:    0.074 m   SHUNTS Systemic VTI: 0.07 m  Maude Emmer MD Electronically signed by Maude Emmer MD Signature Date/Time: 10/02/2024/1:31:52 PM    Final  MONITORS  LONG TERM MONITOR (3-14 DAYS) 01/12/2024  Narrative   1925 episodes of SVT, longest lasting 13 seconds   Frequent PACs (11.7%), frequent  supraventricular couplets (7.3%), and occasional supraventricular triplets (3.7%)   Frequent PVCs (7.2%)   Patch Wear Time:  8 days and 21 hours (2025-02-13T10:36:49-0500 to 2025-02-22T08:09:37-0500)  Patient had a min HR of 45 bpm, max HR of 211 bpm, and avg HR of 68 bpm. Predominant underlying rhythm was Sinus Rhythm. 1925 Supraventricular Tachycardia runs occurred, the run with the fastest interval lasting 4 beats with a max rate of 211 bpm, the longest lasting 12.9 secs with an avg rate of 103 bpm. Some episodes of Supraventricular Tachycardia conducted with possible aberrancy. Isolated SVEs were frequent (11.7%, [id number]), SVE Couplets were frequent (7.3%, 15471), and SVE Triplets were occasional (3.7%, 5253). Isolated VEs were frequent (7.2%, 30551), and no VE Couplets or VE Triplets were present.       ______________________________________________________________________________________________      Risk Assessment/Calculations  CHA2DS2-VASc Score = 4   This indicates a 4.8% annual risk of stroke. The patient's score is based upon: CHF History: 1 HTN History: 1 Diabetes History: 0 Stroke History: 0 Vascular Disease History: 0 Age Score: 1 Gender Score: 1    Physical Exam VS:  BP 122/88 (Cuff Size: Normal)   Pulse (!) 114   Ht 5' 2 (1.575 m)   Wt 119 lb (54 kg)   LMP 10/14/2002 (Approximate)   SpO2 95%   BMI 21.77 kg/m        Wt Readings from Last 3 Encounters:  10/17/24 119 lb (54 kg)  10/09/24 120 lb (54.4 kg)  10/04/24 119 lb 12.8 oz (54.3 kg)    GEN: Well nourished, well developed in no acute distress. Normal WOB on room air  NECK: No JVD  CARDIAC:  Regular rhythm, tachycardic. no murmurs, rubs, gallops RESPIRATORY:  Clear to auscultation without rales, wheezing or rhonchi. Normal WOB on room air   ABDOMEN: Soft, non-tender, non-distended EXTREMITIES:  No edema in BLE; No deformity   ASSESSMENT AND PLAN  Persistent atrial fibrillation/flutter  -  Admitted on 09/27/2024 with shortness of breath, found to be in rapid A-fib/flutter.  Treated with IV amiodarone .  Underwent TEE guided cardioversion after being loaded with amio on 10/02/2024 with successful conversion  to normal sinus rhythm with PACs.  Patient was discharged on amiodarone  200 mg daily, metoprolol  succinate 25 mg daily - Patient seen in the ED on 11/26 found to be in a flutter with RVR.  Treated with metoprolol .  Amiodarone  increased to 400 mg daily. - Patient has continued to be tachycardic at home with HR often in the 110s-120s. HR 114 today. EKG showed atrial flutter  - Increase metoprolol  succinate to 100 mg daily. Hold entresto  and spironolactone  to prevent hypotension  - Okay to decrease amiodarone  back to 200 mg daily as patient has completed load.  LFTs normal on 10/04/2024.  TSH normal on 09/27/2024 - Continue Eliquis  5 mg twice daily  - Patient has been referred to Dr. Kennyth with EP to discuss watchman given risk of intracranial hemorrhage. Has appointment later this month  - Referred to the afib clinic to be seen next week for HR check and to discuss rhythm control strategy.   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 She has not been taking her entresto  for the past 2 days due to low BP  - Increase metoprolol  succinate to 100 mg daily for HR control  - Hold entresto  and spironolactone  while on higher dose of metoprolol . Anticipate resuming these medications once atrial flutter and HR are better controlled  -  Patient had labs drawn through her PCP this week. Reports renal function, electrolytes were normal  - Plan to repeat echo in 3-6 months after afib/flutter controlled.  If EF remains reduced, consider ischemic evaluation at that time. She denies chest pain   Mitral valve regurgitation - TEE  10/02/2024 showed moderate mitral valve regurgitation - Planning to repeat echo in 3-6 months as above   Abnormal Echocardiogram  - TEE 10/02/2024 showed filamentous strands on the aortic side of the aortic valve, question Lambl's excrescence. No arctic insufficiency noted. -Blood cultures during recent admission showed no growth. Note, patient was on antibiotics at the time they were drawn - No further intervention at this time  Recent CVA  - Brain MRI during recent admission showed CVA with amyloid angiopathy with risk of intracranial hemorrhage - As above, referring for Watchman consideration given increased risk of intracranial hemorrhage    Dispo: Follow up in 1 week with afib clinic   Signed, Rollo FABIENE Louder, PA-C

## 2024-10-16 ENCOUNTER — Ambulatory Visit: Attending: Cardiology | Admitting: Pharmacist

## 2024-10-16 ENCOUNTER — Other Ambulatory Visit (HOSPITAL_COMMUNITY): Payer: Self-pay

## 2024-10-16 ENCOUNTER — Encounter: Payer: Self-pay | Admitting: Pharmacist

## 2024-10-16 VITALS — BP 120/82 | HR 98

## 2024-10-16 DIAGNOSIS — I10 Essential (primary) hypertension: Secondary | ICD-10-CM

## 2024-10-16 MED ORDER — METOPROLOL SUCCINATE ER 50 MG PO TB24
50.0000 mg | ORAL_TABLET | Freq: Every day | ORAL | 3 refills | Status: DC
  Filled 2024-10-16: qty 90, 90d supply, fill #0

## 2024-10-16 NOTE — Progress Notes (Unsigned)
 diltiPatient ID: Destiny Elliott                 DOB: [birth year]                      MRN: [id number]      HPI: Destiny Elliott is a 73 y.o. female referred by Dr. Kate to HTN clinic. PMH is significant for SVT, hypertension, hyperlipidemia. Presented today for hypertension clinic   Was put on hydrochlorothiazide  25 mg daily it was d/c by patient then olmesartan  was started and she continued to take atenolol  75 mg daily. At last visit with MD 08/22/2024 olmesartan  was increased to 10 mg daily. Aldosterone-renin ration was WNL post BMP K level was low so supplement was added 20 mEq daily. K level improved patient was advised to stay hydrated as mild worsening renal function noted.   Patient presented today for hypertension follow up. She has had TEE cardioversion  10/02/2024 MRI shows CVA and amyloid angiopathy with increased risk of intracranial hemorrhage. Apixaban  was continued Entresto  low dose and spironolactone  were added to other meds for cardiomyopathy  Today patient presented for follow up with her daughter reports her PCP yesterday changed metoprolol  succinate 25 mg daily  to metoprolol  tartrate 50 mg twice daily  she has not started taking it yet. Her BP yesterday was low and heart rate was in high 90's to 110. She denies dizziness, swelling. She lost 10 lbs since hospital visits. She is gaining her strength back waiting for PT to call back and schedule for sessions. Her daughter looks after her now and cook heart healthy meals so she can control her sodium intake    Current HTN meds: metoprolol  succinate 50 mg daily,  Previously tried: amlodipine - can't remember the reaction  BP goal: <130/80    Diet: home cooked heart healthy meals   Exercise: none    Home BP readings: varies a lot - from past 2 weeks 150-170/ 90-110 and her HR 110 and some days higher    Wt Readings from Last 3 Encounters:  10/09/24 120 lb (54.4 kg)  10/04/24 119 lb 12.8 oz (54.3 kg)  09/26/24 131  lb 6.4 oz (59.6 kg)   BP Readings from Last 3 Encounters:  10/09/24 (!) 126/91  10/04/24 128/88  09/27/24 (!) 167/122   Pulse Readings from Last 3 Encounters:  10/09/24 (!) 101  10/04/24 88  09/27/24 (!) 130    Renal function: Estimated Creatinine Clearance: 39.6 mL/min (by C-G formula based on SCr of 1 mg/dL).  Past Medical History:  Diagnosis Date   Abnormal Pap smear 1994   --CIN I   Allergic rhinitis    Anxiety    Dense breasts    Endocervical polyp 01/1993   -benign   Hypercholesterolemia    Hypertension    Osteopenia    Paroxysmal atrial fibrillation (HCC)     Current Outpatient Medications on File Prior to Visit  Medication Sig Dispense Refill   amiodarone  (PACERONE ) 200 MG tablet Take 1 tablet (200 mg total) by mouth daily. 30 tablet 1   apixaban  (ELIQUIS ) 5 MG TABS tablet Take 1 tablet (5 mg total) by mouth 2 (two) times daily. 180 tablet 1   ARIPiprazole  (ABILIFY ) 2 MG tablet Take 2 mg by mouth daily.     levETIRAcetam  (KEPPRA ) 500 MG tablet Take 1 tablet (500 mg total) by mouth 2 (two) times daily. 60 tablet 1   metoprolol  succinate (TOPROL -XL) 25 MG 24  hr tablet Take 1 tablet (25 mg total) by mouth daily. 30 tablet 1   sacubitril -valsartan  (ENTRESTO ) 24-26 MG Take 1 tablet by mouth 2 (two) times daily. 60 tablet 1   sodium chloride  (OCEAN) 0.65 % SOLN nasal spray Place into both nostrils daily.     spironolactone  (ALDACTONE ) 25 MG tablet Take 0.5 tablets (12.5 mg total) by mouth daily. 15 tablet 1   No current facility-administered medications on file prior to visit.    Allergies  Allergen Reactions   Ambien [Zolpidem Tartrate] Other (See Comments)    Causes depression   Gabapentin Other (See Comments)    Hypertension   Nickel Hives and Itching   Nortriptyline Other (See Comments)    Causes dry sinuses,dry mouth   Paxil [Paroxetine Hcl] Other (See Comments)    Agitation   Remeron [Mirtazapine] Other (See Comments)    Agitation and depression    Trazodone  Hcl Other (See Comments)    Agitation   Zoloft [Sertraline Hcl] Other (See Comments)    Agitation   Zolpidem Other (See Comments)    Other Reaction(s): personality change   Amlodipine Besylate Other (See Comments)    Patient cannot recall.    Benadryl  [Diphenhydramine  Hcl] Other (See Comments)    Causes nasal dryness   Buspar [Buspirone] Other (See Comments)    Abnormal sensory issues   Dextromethorphan-Guaifenesin  Other (See Comments)    Made patient mucus passages extremely dry   Flonase [Fluticasone Propionate] Other (See Comments)    Reaction type/severity unknown    Nirmatrelvir-Ritonavir Diarrhea    Other Reaction(s): diarrhea   Tylenol  Pm Extra [Diphenhydramine -Apap (Sleep)] Other (See Comments)    Causes nasal dryness   Vitamin D  Analogs Other (See Comments)    doesn't take Vit D supplements because they make her feel bad     Last menstrual period 10/14/2002.   Assessment/Plan:  1. Hypertension -   Assessment and plan:  BP today in the office 120/82 heart rate 98. Patient forgot to bring home BP log but was able to bring some readings from last night and this morning BP ~ 105/60 heart rate 98-114. Pt has not started tartrate formulation that PCP prescribed yesterday. Increased current metoprolol  succinate dose from 25 mg daily to 50 mg daily. Pt to hold Entresto  if BP persistently remains low. Patient will be seeing Rollo Louder- PA-C tomorrow and follow up with me in 6-8 weeks      Thank you  Robbi Blanch, Pharm.D [name] [address], [place], [place] [address] Phone: [phone]; Fax: [phone]

## 2024-10-16 NOTE — Patient Instructions (Signed)
 Changes made by your pharmacist Robbi Blanch, PharmD at today's visit:    Instructions/Changes  (what do you need to do) Your Notes  (what you did and when you did it)  Increase dose of metoprolol  succinate ER to 50 mg once a day    Continue taking Entresto  24/26 mg twice daily ( if BP persistently low with Dizziness hold Entresto ) continue taking spironolactone  25 mg 1/2 tab daily     Bring all of your meds, your BP cuff and your record of home blood pressures to your next appointment.    HOW TO TAKE YOUR BLOOD PRESSURE AT HOME  Rest 5 minutes before taking your blood pressure.  Don't smoke or drink caffeinated beverages for at least 30 minutes before. Take your blood pressure before (not after) you eat. Sit comfortably with your back supported and both feet on the floor (don't cross your legs). Elevate your arm to heart level on a table or a desk. Use the proper sized cuff. It should fit smoothly and snugly around your bare upper arm. There should be enough room to slip a fingertip under the cuff. The bottom edge of the cuff should be 1 inch above the crease of the elbow. Ideally, take 3 measurements at one sitting and record the average.  Important lifestyle changes to control high blood pressure  Intervention  Effect on the BP  Lose extra pounds and watch your waistline Weight loss is one of the most effective lifestyle changes for controlling blood pressure. If you're overweight or obese, losing even a small amount of weight can help reduce blood pressure. Blood pressure might go down by about 1 millimeter of mercury (mm Hg) with each kilogram (about 2.2 pounds) of weight lost.  Exercise regularly As a general goal, aim for at least 30 minutes of moderate physical activity every day. Regular physical activity can lower high blood pressure by about 5 to 8 mm Hg.  Eat a healthy diet Eating a diet rich in whole grains, fruits, vegetables, and low-fat dairy products and low in  saturated fat and cholesterol. A healthy diet can lower high blood pressure by up to 11 mm Hg.  Reduce salt (sodium) in your diet Even a small reduction of sodium in the diet can improve heart health and reduce high blood pressure by about 5 to 6 mm Hg.  Limit alcohol One drink equals 12 ounces of beer, 5 ounces of wine, or 1.5 ounces of 80-proof liquor.  Limiting alcohol to less than one drink a day for women or two drinks a day for men can help lower blood pressure by about 4 mm Hg.   If you have any questions or concerns please use My Chart to send questions or call the office at [phone]

## 2024-10-17 ENCOUNTER — Other Ambulatory Visit (HOSPITAL_COMMUNITY): Payer: Self-pay

## 2024-10-17 ENCOUNTER — Encounter: Payer: Self-pay | Admitting: Cardiology

## 2024-10-17 ENCOUNTER — Ambulatory Visit: Attending: Cardiology | Admitting: Cardiology

## 2024-10-17 VITALS — BP 122/88 | HR 114 | Ht 62.0 in | Wt 119.0 lb

## 2024-10-17 DIAGNOSIS — I4892 Unspecified atrial flutter: Secondary | ICD-10-CM | POA: Diagnosis not present

## 2024-10-17 DIAGNOSIS — Z8673 Personal history of transient ischemic attack (TIA), and cerebral infarction without residual deficits: Secondary | ICD-10-CM | POA: Diagnosis not present

## 2024-10-17 DIAGNOSIS — I340 Nonrheumatic mitral (valve) insufficiency: Secondary | ICD-10-CM

## 2024-10-17 DIAGNOSIS — I10 Essential (primary) hypertension: Secondary | ICD-10-CM | POA: Diagnosis not present

## 2024-10-17 DIAGNOSIS — I4819 Other persistent atrial fibrillation: Secondary | ICD-10-CM | POA: Diagnosis not present

## 2024-10-17 MED ORDER — AMIODARONE HCL 200 MG PO TABS
200.0000 mg | ORAL_TABLET | Freq: Every day | ORAL | 3 refills | Status: AC
  Filled 2024-10-17: qty 30, 30d supply, fill #0
  Filled 2024-10-17: qty 90, 90d supply, fill #0
  Filled 2024-11-18: qty 30, 30d supply, fill #0
  Filled 2024-12-10: qty 30, 30d supply, fill #1

## 2024-10-17 NOTE — Patient Instructions (Addendum)
 Medication Instructions:  Stop Entresto  24/26 mg as directed Stop Spironolactone  12.5 mg as directed Increase Metoprolol  100 mg daily Amiodarone  200 mg daily    *If you need a refill on your cardiac medications before your next appointment, please call your pharmacy*  Lab Work: NONE ordered at this time of appointment   Testing/Procedures: NONE ordered at this time of appointment   Follow-Up: At [facility], you and your health needs are our priority.  As part of our continuing mission to provide you with exceptional heart care, our providers are all part of one team.  This team includes your primary Cardiologist (physician) and Advanced Practice Providers or APPs (Physician Assistants and Nurse Practitioners) who all work together to provide you with the care you need, when you need it.  Your next appointment:    1 week [facility]   We recommend signing up for the patient portal called MyChart.  Sign up information is provided on this After Visit Summary.  MyChart is used to connect with patients for Virtual Visits (Telemedicine).  Patients are able to view lab/test results, encounter notes, upcoming appointments, etc.  Non-urgent messages can be sent to your provider as well.   To learn more about what you can do with MyChart, go to [email or web address].

## 2024-10-19 ENCOUNTER — Telehealth: Payer: Self-pay | Admitting: Cardiology

## 2024-10-19 NOTE — Telephone Encounter (Signed)
 Patient's daughter called answering service this morning.  On 12/4, patient's metoprolol  was increased to 100 mg daily for A-fib with RVR.  Her Entresto  and spironolactone  were held to prevent hypotension with this medicine change.  Patient's daughter tells me that patient had taken the first increased dose of metoprolol  yesterday and everything went very well.  Heart rate was well-controlled and patient did not have any negative side effects.  This morning, before medications patient's heart rate was 78 bpm.  BP 120/92.  Patient's daughter wondering if we should continue the increased dose of the metoprolol  now that her heart rate is well-controlled.  Discussed that as patient's blood pressure is well-controlled and heart rate in the 70s, okay to continue metoprolol  succinate 100 mg daily.  Rollo FABIENE Louder, PA-C 10/19/2024 10:35 AM

## 2024-10-25 ENCOUNTER — Ambulatory Visit (HOSPITAL_COMMUNITY)
Admission: RE | Admit: 2024-10-25 | Discharge: 2024-10-25 | Attending: Physician Assistant | Admitting: Physician Assistant

## 2024-10-25 VITALS — BP 158/94 | HR 94 | Ht 62.0 in | Wt 121.6 lb

## 2024-10-25 DIAGNOSIS — Z5181 Encounter for therapeutic drug level monitoring: Secondary | ICD-10-CM | POA: Diagnosis not present

## 2024-10-25 DIAGNOSIS — I4819 Other persistent atrial fibrillation: Secondary | ICD-10-CM | POA: Diagnosis not present

## 2024-10-25 DIAGNOSIS — D6869 Other thrombophilia: Secondary | ICD-10-CM

## 2024-10-25 DIAGNOSIS — Z79899 Other long term (current) drug therapy: Secondary | ICD-10-CM | POA: Diagnosis not present

## 2024-10-25 DIAGNOSIS — I4891 Unspecified atrial fibrillation: Secondary | ICD-10-CM | POA: Diagnosis not present

## 2024-10-25 DIAGNOSIS — I484 Atypical atrial flutter: Secondary | ICD-10-CM | POA: Diagnosis not present

## 2024-10-25 NOTE — Progress Notes (Signed)
 Primary Care Physician: [facility] Primary Cardiologist: Lonni LITTIE Nanas, MD Electrophysiologist: Eulas FORBES Furbish, MD  Referring Physician: Dr Furbish Hands Destiny Elliott is a 73 y.o. female with a history of SVT, HTN, HLD, bipolar disorder, atrial flutter, atrial fibrillation who presents for follow up in the [facility].  The patient was initially diagnosed with atrial fibrillation 09/06/24 at her visit with Dr Furbish. Her atenolol  was increased for rate control and she was started on Eliquis  for stroke prevention. She was seen in clinic 09/26/24 and was set up for DCCV but presented to the ED the following day with symptoms of SOB. She was fluid overloaded and still in rapid atrial flutter. Her rates were difficult to control and she was started in IV amiodarone  and underwent TEE/DCCV on 10/02/24. Echo showed reduced LV function at 35%. Of note, she was diagnosed with influenza A at that time.   During her hospitalization, a code stroke was called for acute change in mental status and aphasia. Family at bedside states she was having periods of staring off and unresponsiveness with some shaking of her left shoulder. TIA possible but felt to be seizures, on Keppra . 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   Patient returns for follow up for atrial fibrillation and amiodarone  monitoring. She was back out of rhythm at an ED visit on 10/09/24. Her amiodarone  was temporarily increased. She remains in atrial flutter today but feels well. Her heart rates at home have been much better controlled. No symptoms of fluid overload. No bleeding issues on anticoagulation.    Today, she  denies symptoms of palpitations, chest pain, shortness of breath, orthopnea, PND, lower extremity edema, dizziness, presyncope, syncope, snoring, daytime somnolence, bleeding, or neurologic sequela. The patient is tolerating medications without difficulties  and is otherwise without complaint today.    Atrial Fibrillation Risk Factors:  she does not have symptoms or diagnosis of sleep apnea. she does not have a history of rheumatic fever.   Atrial Fibrillation Management history:  Previous antiarrhythmic drugs: amiodarone   Previous cardioversions: 10/02/24 Previous ablations: none Anticoagulation history: Eliquis   ROS- All systems are reviewed and negative except as per the HPI above.  Past Medical History:  Diagnosis Date   Abnormal Pap smear 1994   --CIN I   Allergic rhinitis    Anxiety    Dense breasts    Endocervical polyp 01/1993   -benign   Hypercholesterolemia    Hypertension    Osteopenia    Paroxysmal atrial fibrillation (HCC)     Current Outpatient Medications  Medication Sig Dispense Refill   amiodarone  (PACERONE ) 200 MG tablet Take 1 tablet (200 mg total) by mouth daily. 90 tablet 3   apixaban  (ELIQUIS ) 5 MG TABS tablet Take 1 tablet (5 mg total) by mouth 2 (two) times daily. 180 tablet 1   ARIPiprazole  (ABILIFY ) 2 MG tablet Take 2 mg by mouth daily.     levETIRAcetam  (KEPPRA ) 500 MG tablet Take 1 tablet (500 mg total) by mouth 2 (two) times daily. 60 tablet 1   metoprolol  succinate (TOPROL -XL) 100 MG 24 hr tablet Take 100 mg by mouth daily. Take with or immediately following a meal.     sodium chloride  (OCEAN) 0.65 % SOLN nasal spray Place into both nostrils daily. (Patient taking differently: Place 1 spray into both nostrils as needed.)     No current facility-administered medications for this encounter.    Physical Exam: BP (!) 158/94  Pulse 94   Ht 5' 2 (1.575 m)   Wt 55.2 kg   LMP 10/14/2002   BMI 22.24 kg/m   GEN: Well nourished, well developed in no acute distress CARDIAC: Irregularly irregular rate and rhythm, no murmurs, rubs, gallops RESPIRATORY:  Clear to auscultation without rales, wheezing or rhonchi  ABDOMEN: Soft, non-tender, non-distended EXTREMITIES:  No edema; No deformity    Wt  Readings from Last 3 Encounters:  10/25/24 55.2 kg  10/17/24 54 kg  10/09/24 54.4 kg     EKG Interpretation Date/Time:  Friday October 25 2024 09:24:04 EST Ventricular Rate:  94 PR Interval:    QRS Duration:  88 QT Interval:  362 QTC Calculation: 452 R Axis:   111  Text Interpretation: Atrial flutter with variable A-V block Right axis deviation Pulmonary disease pattern Nonspecific T wave abnormality Abnormal ECG When compared with ECG of 17-Oct-2024 14:59, No significant change was found Confirmed by Kristopher Delk ([id number]) on 10/25/2024 9:40:18 AM    Echo 09/29/24 demonstrated   1. Patient tachycardic throughout exam.   2. Left ventricular ejection fraction, by estimation, is 35%. Left  ventricular ejection fraction by 2D MOD biplane is 35.6 %. The left  ventricle has moderately decreased function. The left ventricle  demonstrates global hypokinesis. There is mild  concentric left ventricular hypertrophy. Diastology indeterminate due to  atrail arrhythmia.   3. Right ventricular systolic function is mildly reduced. The right  ventricular size is normal. Tricuspid regurgitation signal is inadequate  for assessing PA pressure.   4. There is mild prolapse of the anterior mitral valve leaflet. The  mitral valve is abnormal. Mild mitral valve regurgitation. No evidence of  mitral stenosis. There is mild prolapse of of the mitral valve.   5. The aortic valve is tricuspid. Aortic valve regurgitation is not  visualized. No aortic stenosis is present.   6. The inferior vena cava is dilated in size with >50% respiratory  variability, suggesting right atrial pressure of 8 mmHg.   Comparison(s): Changes from prior study are noted. Newly reduced LV  systolic function.     CHA2DS2-VASc Score = 4  The patient's score is based upon: CHF History: 1 HTN History: 1 Diabetes History: 0 Stroke History: 0 Vascular Disease History: 0 Age Score: 1 Gender Score: 1       ASSESSMENT AND  PLAN: Persistent Atrial Fibrillation/atrial flutter (ICD10:  I48.19) The patient's CHA2DS2-VASc score is 4, indicating a 4.8% annual risk of stroke.   Loaded on amiodarone . S/p TEE/DCCV on 10/02/24 with quick return of flutter.  We discussed rhythm control options today. Would like to get her off amiodarone  to avoid off target effects. Not a candidate for class IC or Multaq with reduced EF. She is agreeable to discussing ablation with EP. She already has a visit with Dr Kennyth on 12/30. We discussed repeat DCCV now that she has loaded on amiodarone  longer, given her rate control and improved symptoms she would like to continue her present therapy until she sees Dr Kennyth.  Continue amiodarone  200 mg daily for now. Continue Toprol  100 mg daily Continue Eliquis  5 mg BID  Secondary Hypercoagulable State (ICD10:  D68.69) The patient is at significant risk for stroke/thromboembolism based upon her CHA2DS2-VASc Score of 4.  Continue Apixaban  (Eliquis ). Concern for high bleeding risk with suspected cerebral amyloid on MRI. We discussed the Watchman device during her visit today, brochure provided. ? If she would be a candidate for concomitant procedure.   High Risk Medication Monitoring (  ICD 10: Z79.899) Patient requires ongoing monitoring for anti-arrhythmic medication which has the potential to cause life threatening arrhythmias. Intervals on ECG acceptable for amiodarone  monitoring.   HTN Mildly elevated today, patient is very anxious. Well controlled in the hospital and at previous visits. No changes today.   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  GDMT per primary cardiology team Fluid status appears stable today   Follow up with Dr Kennyth as scheduled.    [facility] [address] [place], [place] [address] [phone]

## 2024-10-30 LAB — BASIC METABOLIC PANEL WITH GFR
BUN/Creatinine Ratio: 18 (ref 12–28)
BUN: 19 mg/dL (ref 8–27)
CO2: 26 mmol/L (ref 20–29)
Calcium: 9.3 mg/dL (ref 8.7–10.3)
Chloride: 103 mmol/L (ref 96–106)
Creatinine, Ser: 1.03 mg/dL — ABNORMAL HIGH (ref 0.57–1.00)
Glucose: 124 mg/dL — ABNORMAL HIGH (ref 70–99)
Potassium: 4 mmol/L (ref 3.5–5.2)
Sodium: 143 mmol/L (ref 134–144)
eGFR: 57 mL/min/1.73 — ABNORMAL LOW (ref >59)

## 2024-10-31 ENCOUNTER — Other Ambulatory Visit (HOSPITAL_COMMUNITY): Payer: Self-pay

## 2024-11-12 ENCOUNTER — Ambulatory Visit: Admitting: Cardiology

## 2024-11-12 ENCOUNTER — Encounter: Payer: Self-pay | Admitting: Cardiology

## 2024-11-12 VITALS — BP 168/116 | HR 100 | Ht 62.0 in | Wt 123.8 lb

## 2024-11-12 DIAGNOSIS — I5022 Chronic systolic (congestive) heart failure: Secondary | ICD-10-CM | POA: Diagnosis not present

## 2024-11-12 DIAGNOSIS — I680 Cerebral amyloid angiopathy: Secondary | ICD-10-CM

## 2024-11-12 DIAGNOSIS — E854 Organ-limited amyloidosis: Secondary | ICD-10-CM

## 2024-11-12 DIAGNOSIS — D6869 Other thrombophilia: Secondary | ICD-10-CM

## 2024-11-12 DIAGNOSIS — I4819 Other persistent atrial fibrillation: Secondary | ICD-10-CM

## 2024-11-12 DIAGNOSIS — I4892 Unspecified atrial flutter: Secondary | ICD-10-CM | POA: Diagnosis not present

## 2024-11-12 DIAGNOSIS — I4891 Unspecified atrial fibrillation: Secondary | ICD-10-CM

## 2024-11-12 DIAGNOSIS — Z79899 Other long term (current) drug therapy: Secondary | ICD-10-CM | POA: Diagnosis not present

## 2024-11-12 NOTE — Patient Instructions (Signed)
 Medication Instructions:  Your physician has recommended you make the following change in your medication:  1) INCREASE amiodarone  to 200 mg twice daily (400 mg total) for 1 week, then back to 200 mg once daily  *If you need a refill on your cardiac medications before your next appointment, please call your pharmacy*  Lab Work: TODAY: BMET and CBC  Testing/Procedures: Cardioversion  Your physician has recommended that you have a Cardioversion (DCCV). Electrical Cardioversion uses a jolt of electricity to your heart either through paddles or wired patches attached to your chest. This is a controlled, usually prescheduled, procedure. Defibrillation is done under light anesthesia in the hospital, and you usually go home the day of the procedure. This is done to get your heart back into a normal rhythm. You are not awake for the procedure. Please see the instruction sheet given to you today.  Follow-Up: At [facility], you and your health needs are our priority.  As part of our continuing mission to provide you with exceptional heart care, our providers are all part of one team.  This team includes your primary Cardiologist (physician) and Advanced Practice Providers or APPs (Physician Assistants and Nurse Practitioners) who all work together to provide you with the care you need, when you need it.  Your next appointment:   1 month  Provider:   Fonda Kitty, MD

## 2024-11-12 NOTE — H&P (View-Only) (Signed)
 " Electrophysiology Office Note:   Date:  11/12/2024  ID:  Destiny Elliott, DOB 05-Sep-1951, MRN [id number]  Primary Cardiologist: Lonni LITTIE Nanas, MD Electrophysiologist: Eulas FORBES Furbish, MD  {Click to update primary MD,subspecialty MD or APP then REFRESH:1}    History of Present Illness:   Destiny Elliott is a 73 y.o. female with h/o SVT, HTN, HLD, bipolar disorder, atrial flutter, atrial fibrillation  seen today for ***  Discussed the use of AI scribe software for clinical note transcription with the patient, who gave verbal consent to proceed.  History of Present Illness     Review of systems complete and found to be negative unless listed in HPI.   EP Information / Studies Reviewed:    {EKGtoday:28818}      ***  Risk Assessment/Calculations:   {Does this patient have ATRIAL FIBRILLATION?[phone]} No BP recorded.  {Refresh Note OR Click here to enter BP  :1}***        Physical Exam:   VS:  LMP 10/14/2002    Wt Readings from Last 3 Encounters:  10/25/24 121 lb 9.6 oz (55.2 kg)  10/17/24 119 lb (54 kg)  10/09/24 120 lb (54.4 kg)     GEN: Well nourished, well developed in no acute distress NECK: No JVD CARDIAC: {EPRHYTHM:28826}, no murmurs, rubs, gallops RESPIRATORY:  Clear to auscultation without rales, wheezing or rhonchi  ABDOMEN: Soft, non-distended EXTREMITIES:  No edema; No deformity   ASSESSMENT AND PLAN:   I have seen Destiny Elliott in the office today who is being considered for a Watchman left atrial appendage closure device. I believe they will benefit from this procedure given their history of atrial fibrillation, CHA2DS2-VASc score of *** and unadjusted ischemic stroke rate of ***% per year. Unfortunately, the patient is not felt to be a long term anticoagulation candidate secondary to ***. The patient's chart has been reviewed and I feel that they would be a candidate for short term oral anticoagulation after Watchman implant.   It is  my belief that after undergoing a LAA closure procedure, Destiny Elliott will not need long term anticoagulation which eliminates anticoagulation side effects and major bleeding risk.   Procedural risks for the Watchman implant have been reviewed with the patient including a 0.5% risk of stroke, <1% risk of perforation and <1% risk of device embolization. Other risks include bleeding, vascular damage, tamponade, worsening renal function, and death. The patient understands these risk and wishes to proceed.     The published clinical data on the safety and effectiveness of WATCHMAN include but are not limited to the following: - Holmes DR, Jess BEARD, Sick P et al. for the PROTECT AF Investigators. Percutaneous closure of the left atrial appendage versus warfarin therapy for prevention of stroke in patients with atrial fibrillation: a randomised non-inferiority trial. Lancet 2009; 374: 534-42. GLENWOOD Jess BEARD, Doshi SK, Jonita VEAR Satchel D et al. on behalf of the PROTECT AF Investigators. Percutaneous Left Atrial Appendage Closure for Stroke Prophylaxis in Patients With Atrial Fibrillation 2.3-Year Follow-up of the PROTECT AF (Watchman Left Atrial Appendage System for Embolic Protection in Patients With Atrial Fibrillation) Trial. Circulation 2013; 127:720-729. - Alli O, Doshi S,  Kar S, Reddy VY, Sievert H et al. Quality of Life Assessment in the Randomized PROTECT AF (Percutaneous Closure of the Left Atrial Appendage Versus Warfarin Therapy for Prevention of Stroke in Patients With Atrial Fibrillation) Trial of Patients at Risk for Stroke With Nonvalvular Atrial Fibrillation. J Am Coll Cardiol 2013;  38:8209-1. GLENWOOD Satchel DR, Archer RAMAN, Price M, Whisenant B, Sievert H, Doshi RAMAN Olean MARLA Jess V. Prospective randomized evaluation of the Watchman left atrial appendage Device in patients with atrial fibrillation versus long-term warfarin therapy; the PREVAIL trial. Journal of the [employer] of Cardiology, Vol. 4,  No. 1, 2014, 1-11. - Kar S, Doshi SK, Sadhu A, Horton R, Osorio J et al. Primary outcome evaluation of a next-generation left atrial appendage closure device: results from the PINNACLE FLX trial. Circulation 2021;143(18)1754-1762.    After today's visit with the patient which was dedicated solely for shared decision making visit regarding LAA closure device, the patient decided to proceed with the LAA appendage closure procedure scheduled to be done in the near future at [facility]. Prior to the procedure, I would like to obtain a gated CT scan of the chest with contrast timed for PV/LA visualization.   Additionally, the patient will need ***.  HAS-BLED score *** Hypertension {YES/NO:21197} Abnormal renal and liver function (Dialysis, transplant, Cr >2.26 mg/dL /Cirrhosis or Bilirubin >2x Normal or AST/ALT/AP >3x Normal) {YES/NO:21197} Stroke {YES/NO:21197} Bleeding {YES/NO:21197} Labile INR (Unstable/high INR) {YES/NO:21197} Elderly (>65) {YES/NO:21197} Drugs or alcohol (>= 8 drinks/week, anti-plt or NSAID) {YES/NO:21197}  CHA2DS2-VASc Score = 4  The patient's score is based upon: CHF History: 1 HTN History: 1 Diabetes History: 0 Stroke History: 0 Vascular Disease History: 0 Age Score: 1 Gender Score: 1   {Confirm score is correct.  If not, click here to update score.  REFRESH note.  :1}    ASSESSMENT AND PLAN: Persistent Atrial Fibrillation/atrial flutter (ICD10:  I48.19) The patient's CHA2DS2-VASc score is 4, indicating a 4.8% annual risk of stroke.   Loaded on amiodarone . S/p TEE/DCCV on 10/02/24 with quick return of flutter.  We discussed rhythm control options today. Would like to get her off amiodarone  to avoid off target effects. Not a candidate for class IC or Multaq with reduced EF. She is agreeable to discussing ablation with EP. She already has a visit with Dr Kennyth on 12/30. We discussed repeat DCCV now that she has loaded on amiodarone  longer, given her rate  control and improved symptoms she would like to continue her present therapy until she sees Dr Kennyth.  Continue amiodarone  200 mg daily for now. Continue Toprol  100 mg daily Continue Eliquis  5 mg BID   Secondary Hypercoagulable State (ICD10:  D68.69) The patient is at significant risk for stroke/thromboembolism based upon her CHA2DS2-VASc Score of 4.  Continue Apixaban  (Eliquis ). Concern for high bleeding risk with suspected cerebral amyloid on MRI. We discussed the Watchman device during her visit today, brochure provided. ? If she would be a candidate for concomitant procedure.    High Risk Medication Monitoring (ICD 10: [id number]) Patient requires ongoing monitoring for anti-arrhythmic medication which has the potential to cause life threatening arrhythmias. Intervals on ECG acceptable for amiodarone  monitoring.    HTN Mildly elevated today, patient is very anxious. Well controlled in the hospital and at previous visits. No changes today.    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  GDMT per primary cardiology team Fluid status appears stable today   Signed,  Fonda Kennyth, MD    11/12/2024 1:49 PM    Assessment & Plan       Follow up with {EPMDS:28135::EP Team} {EPFOLLOW LE:71826}  Signed, Fonda Kennyth, MD  "

## 2024-11-12 NOTE — Progress Notes (Unsigned)
 " Electrophysiology Office Note:   Date:  11/12/2024  ID:  Destiny Elliott, DOB 05-Sep-1951, MRN [id number]  Primary Cardiologist: Lonni LITTIE Nanas, MD Electrophysiologist: Eulas FORBES Furbish, MD  {Click to update primary MD,subspecialty MD or APP then REFRESH:1}    History of Present Illness:   Destiny Elliott is a 73 y.o. female with h/o SVT, HTN, HLD, bipolar disorder, atrial flutter, atrial fibrillation  seen today for ***  Discussed the use of AI scribe software for clinical note transcription with the patient, who gave verbal consent to proceed.  History of Present Illness     Review of systems complete and found to be negative unless listed in HPI.   EP Information / Studies Reviewed:    {EKGtoday:28818}      ***  Risk Assessment/Calculations:   {Does this patient have ATRIAL FIBRILLATION?[phone]} No BP recorded.  {Refresh Note OR Click here to enter BP  :1}***        Physical Exam:   VS:  LMP 10/14/2002    Wt Readings from Last 3 Encounters:  10/25/24 121 lb 9.6 oz (55.2 kg)  10/17/24 119 lb (54 kg)  10/09/24 120 lb (54.4 kg)     GEN: Well nourished, well developed in no acute distress NECK: No JVD CARDIAC: {EPRHYTHM:28826}, no murmurs, rubs, gallops RESPIRATORY:  Clear to auscultation without rales, wheezing or rhonchi  ABDOMEN: Soft, non-distended EXTREMITIES:  No edema; No deformity   ASSESSMENT AND PLAN:   I have seen Destiny Elliott in the office today who is being considered for a Watchman left atrial appendage closure device. I believe they will benefit from this procedure given their history of atrial fibrillation, CHA2DS2-VASc score of *** and unadjusted ischemic stroke rate of ***% per year. Unfortunately, the patient is not felt to be a long term anticoagulation candidate secondary to ***. The patient's chart has been reviewed and I feel that they would be a candidate for short term oral anticoagulation after Watchman implant.   It is  my belief that after undergoing a LAA closure procedure, Destiny Elliott will not need long term anticoagulation which eliminates anticoagulation side effects and major bleeding risk.   Procedural risks for the Watchman implant have been reviewed with the patient including a 0.5% risk of stroke, <1% risk of perforation and <1% risk of device embolization. Other risks include bleeding, vascular damage, tamponade, worsening renal function, and death. The patient understands these risk and wishes to proceed.     The published clinical data on the safety and effectiveness of WATCHMAN include but are not limited to the following: - Holmes DR, Jess BEARD, Sick P et al. for the PROTECT AF Investigators. Percutaneous closure of the left atrial appendage versus warfarin therapy for prevention of stroke in patients with atrial fibrillation: a randomised non-inferiority trial. Lancet 2009; 374: 534-42. GLENWOOD Jess BEARD, Doshi SK, Jonita VEAR Satchel D et al. on behalf of the PROTECT AF Investigators. Percutaneous Left Atrial Appendage Closure for Stroke Prophylaxis in Patients With Atrial Fibrillation 2.3-Year Follow-up of the PROTECT AF (Watchman Left Atrial Appendage System for Embolic Protection in Patients With Atrial Fibrillation) Trial. Circulation 2013; 127:720-729. - Alli O, Doshi S,  Kar S, Reddy VY, Sievert H et al. Quality of Life Assessment in the Randomized PROTECT AF (Percutaneous Closure of the Left Atrial Appendage Versus Warfarin Therapy for Prevention of Stroke in Patients With Atrial Fibrillation) Trial of Patients at Risk for Stroke With Nonvalvular Atrial Fibrillation. J Am Coll Cardiol 2013;  38:8209-1. GLENWOOD Satchel DR, Archer RAMAN, Price M, Whisenant B, Sievert H, Doshi RAMAN Destiny Elliott. Prospective randomized evaluation of the Watchman left atrial appendage Device in patients with atrial fibrillation versus long-term warfarin therapy; the PREVAIL trial. Journal of the [employer] of Cardiology, Vol. 4,  No. 1, 2014, 1-11. - Kar S, Doshi SK, Sadhu A, Horton R, Osorio J et al. Primary outcome evaluation of a next-generation left atrial appendage closure device: results from the PINNACLE FLX trial. Circulation 2021;143(18)1754-1762.    After today's visit with the patient which was dedicated solely for shared decision making visit regarding LAA closure device, the patient decided to proceed with the LAA appendage closure procedure scheduled to be done in the near future at [facility]. Prior to the procedure, I would like to obtain a gated CT scan of the chest with contrast timed for PV/LA visualization.   Additionally, the patient will need ***.  HAS-BLED score *** Hypertension {YES/NO:21197} Abnormal renal and liver function (Dialysis, transplant, Cr >2.26 mg/dL /Cirrhosis or Bilirubin >2x Normal or AST/ALT/AP >3x Normal) {YES/NO:21197} Stroke {YES/NO:21197} Bleeding {YES/NO:21197} Labile INR (Unstable/high INR) {YES/NO:21197} Elderly (>65) {YES/NO:21197} Drugs or alcohol (>= 8 drinks/week, anti-plt or NSAID) {YES/NO:21197}  CHA2DS2-VASc Score = 4  The patient's score is based upon: CHF History: 1 HTN History: 1 Diabetes History: 0 Stroke History: 0 Vascular Disease History: 0 Age Score: 1 Gender Score: 1   {Confirm score is correct.  If not, click here to update score.  REFRESH note.  :1}    ASSESSMENT AND PLAN: Persistent Atrial Fibrillation/atrial flutter (ICD10:  I48.19) The patient's CHA2DS2-VASc score is 4, indicating a 4.8% annual risk of stroke.   Loaded on amiodarone . S/p TEE/DCCV on 10/02/24 with quick return of flutter.  We discussed rhythm control options today. Would like to get her off amiodarone  to avoid off target effects. Not a candidate for class IC or Multaq with reduced EF. She is agreeable to discussing ablation with EP. She already has a visit with Dr Kennyth on 12/30. We discussed repeat DCCV now that she has loaded on amiodarone  longer, given her rate  control and improved symptoms she would like to continue her present therapy until she sees Dr Kennyth.  Continue amiodarone  200 mg daily for now. Continue Toprol  100 mg daily Continue Eliquis  5 mg BID   Secondary Hypercoagulable State (ICD10:  D68.69) The patient is at significant risk for stroke/thromboembolism based upon her CHA2DS2-VASc Score of 4.  Continue Apixaban  (Eliquis ). Concern for high bleeding risk with suspected cerebral amyloid on MRI. We discussed the Watchman device during her visit today, brochure provided. ? If she would be a candidate for concomitant procedure.    High Risk Medication Monitoring (ICD 10: J342684) Patient requires ongoing monitoring for anti-arrhythmic medication which has the potential to cause life threatening arrhythmias. Intervals on ECG acceptable for amiodarone  monitoring.    HTN Mildly elevated today, patient is very anxious. Well controlled in the hospital and at previous visits. No changes today.    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  GDMT per primary cardiology team Fluid status appears stable today   Signed,  Fonda Kennyth, MD    11/12/2024 1:49 PM    Assessment & Plan       Follow up with {EPMDS:28135::EP Team} {EPFOLLOW LE:71826}  Signed, Fonda Kennyth, MD  "

## 2024-11-13 ENCOUNTER — Telehealth: Payer: Self-pay | Admitting: Cardiology

## 2024-11-13 LAB — BASIC METABOLIC PANEL WITH GFR
BUN/Creatinine Ratio: 18 (ref 12–28)
BUN: 19 mg/dL (ref 8–27)
CO2: 24 mmol/L (ref 20–29)
Calcium: 9.4 mg/dL (ref 8.7–10.3)
Chloride: 98 mmol/L (ref 96–106)
Creatinine, Ser: 1.05 mg/dL — ABNORMAL HIGH (ref 0.57–1.00)
Glucose: 109 mg/dL — ABNORMAL HIGH (ref 70–99)
Potassium: 4.1 mmol/L (ref 3.5–5.2)
Sodium: 138 mmol/L (ref 134–144)
eGFR: 56 mL/min/1.73 — ABNORMAL LOW

## 2024-11-13 LAB — CBC
Hematocrit: 37.9 % (ref 34.0–46.6)
Hemoglobin: 12.1 g/dL (ref 11.1–15.9)
MCH: 29.8 pg (ref 26.6–33.0)
MCHC: 31.9 g/dL (ref 31.5–35.7)
MCV: 93 fL (ref 79–97)
Platelets: 216 x10E3/uL (ref 150–450)
RBC: 4.06 x10E6/uL (ref 3.77–5.28)
RDW: 13.8 % (ref 11.7–15.4)
WBC: 3.9 x10E3/uL (ref 3.4–10.8)

## 2024-11-13 NOTE — Telephone Encounter (Signed)
 STAT if HR is under 50 or over 120 (normal HR is 60-100 beats per minute)  What is your heart rate? 52  Do you have a log of your heart rate readings (document readings)? 45,38  Do you have any other symptoms? no

## 2024-11-13 NOTE — Telephone Encounter (Signed)
 Called and spoke to [place] (pt's daughter). She is worried that pt's HR is now 38-60. She called and spoke to the on call provider last night who recommended she get in touch with us  and possibly come in for an EKG. Pt has no cardiac symptoms. Saw Dr. Kennyth yesterday who increased Amiodarone  to 200 mg BID for seven days. Pt Has NOT yet started this higher dose. Scheduled for DCCV on 11/19/24.   BP last night around 10 pm was 135/82, BP this am around 10 am was 150/94.   Advised pt to call [phone] ([facility]) for advice on meds and returning to clinic.  She plans on returning to [facility] today for labs (not able to have drawn on 11/12/24 due to poor veins/dehydration per daughter)

## 2024-11-14 ENCOUNTER — Ambulatory Visit: Payer: Self-pay | Admitting: Cardiology

## 2024-11-15 ENCOUNTER — Other Ambulatory Visit (HOSPITAL_COMMUNITY): Payer: Self-pay

## 2024-11-18 ENCOUNTER — Telehealth: Payer: Self-pay | Admitting: Cardiology

## 2024-11-18 ENCOUNTER — Other Ambulatory Visit (HOSPITAL_COMMUNITY): Payer: Self-pay

## 2024-11-18 MED ORDER — METOPROLOL SUCCINATE ER 50 MG PO TB24
100.0000 mg | ORAL_TABLET | Freq: Every day | ORAL | 3 refills | Status: DC
  Filled 2024-11-18: qty 180, 90d supply, fill #0

## 2024-11-18 NOTE — Telephone Encounter (Signed)
 Daughter Kathye) stated she was returning staff call and has questions regarding patient's cardioversion tomorrow.

## 2024-11-18 NOTE — Telephone Encounter (Signed)
 Spoke with the patient's daughter who states that she has already spoken to someone in regards to her questions about the cardioversion tomorrow. She states that she had questions about what medications the patient should take. She is aware to take all medications with a sip of water. She states that the patient does need a refill sent in for metoprolol . Advised this would be sent in. She has no further questions or concerns at this time.

## 2024-11-18 NOTE — Progress Notes (Signed)
 Pt called for pre procedure instructions. Arrival time 0800 NPO after midnight explained Instructed to take am meds with sip of water and confirmed blood thinner consistency Instructed pt need for ride home tomorrow and have responsible adult with them for 24 hrs post procedure.

## 2024-11-19 ENCOUNTER — Ambulatory Visit (HOSPITAL_COMMUNITY)

## 2024-11-19 ENCOUNTER — Encounter (HOSPITAL_COMMUNITY): Payer: Self-pay | Admitting: Internal Medicine

## 2024-11-19 ENCOUNTER — Other Ambulatory Visit: Payer: Self-pay

## 2024-11-19 ENCOUNTER — Encounter (HOSPITAL_COMMUNITY): Admission: RE | Disposition: A | Payer: Self-pay | Source: Home / Self Care | Attending: Internal Medicine

## 2024-11-19 ENCOUNTER — Ambulatory Visit (HOSPITAL_COMMUNITY)
Admission: RE | Admit: 2024-11-19 | Discharge: 2024-11-19 | Disposition: A | Discharge status: Home / Self Care | Attending: Internal Medicine | Admitting: Internal Medicine

## 2024-11-19 DIAGNOSIS — Z79899 Other long term (current) drug therapy: Secondary | ICD-10-CM | POA: Insufficient documentation

## 2024-11-19 DIAGNOSIS — I5033 Acute on chronic diastolic (congestive) heart failure: Secondary | ICD-10-CM | POA: Diagnosis not present

## 2024-11-19 DIAGNOSIS — I4891 Unspecified atrial fibrillation: Secondary | ICD-10-CM | POA: Diagnosis not present

## 2024-11-19 DIAGNOSIS — I110 Hypertensive heart disease with heart failure: Secondary | ICD-10-CM

## 2024-11-19 DIAGNOSIS — Z87891 Personal history of nicotine dependence: Secondary | ICD-10-CM | POA: Diagnosis not present

## 2024-11-19 DIAGNOSIS — I4892 Unspecified atrial flutter: Secondary | ICD-10-CM

## 2024-11-19 DIAGNOSIS — D6869 Other thrombophilia: Secondary | ICD-10-CM | POA: Diagnosis not present

## 2024-11-19 DIAGNOSIS — I680 Cerebral amyloid angiopathy: Secondary | ICD-10-CM | POA: Insufficient documentation

## 2024-11-19 DIAGNOSIS — Z7901 Long term (current) use of anticoagulants: Secondary | ICD-10-CM | POA: Insufficient documentation

## 2024-11-19 DIAGNOSIS — E854 Organ-limited amyloidosis: Secondary | ICD-10-CM | POA: Diagnosis not present

## 2024-11-19 DIAGNOSIS — I5022 Chronic systolic (congestive) heart failure: Secondary | ICD-10-CM | POA: Diagnosis not present

## 2024-11-19 DIAGNOSIS — R569 Unspecified convulsions: Secondary | ICD-10-CM | POA: Diagnosis not present

## 2024-11-19 DIAGNOSIS — F418 Other specified anxiety disorders: Secondary | ICD-10-CM

## 2024-11-19 DIAGNOSIS — I4819 Other persistent atrial fibrillation: Secondary | ICD-10-CM | POA: Insufficient documentation

## 2024-11-19 DIAGNOSIS — Z8673 Personal history of transient ischemic attack (TIA), and cerebral infarction without residual deficits: Secondary | ICD-10-CM | POA: Insufficient documentation

## 2024-11-19 DIAGNOSIS — Z01818 Encounter for other preprocedural examination: Secondary | ICD-10-CM

## 2024-11-19 HISTORY — PX: CARDIOVERSION: EP1203

## 2024-11-19 MED ORDER — LIDOCAINE 2% (20 MG/ML) 5 ML SYRINGE
INTRAMUSCULAR | Status: DC | PRN
  Administered 2024-11-19: 60 mg via INTRAVENOUS

## 2024-11-19 MED ORDER — METOPROLOL SUCCINATE ER 50 MG PO TB24: 50.0000 mg | ORAL_TABLET | Freq: Every day | ORAL | 3 refills | Status: DC

## 2024-11-19 MED ORDER — PROPOFOL 10 MG/ML IV BOLUS
INTRAVENOUS | Status: DC | PRN
  Administered 2024-11-19: 50 mg via INTRAVENOUS

## 2024-11-19 MED ORDER — SODIUM CHLORIDE 0.9 % IV SOLN: INTRAVENOUS | Status: DC

## 2024-11-19 MED ORDER — SODIUM CHLORIDE 0.9% FLUSH
INTRAVENOUS | Status: DC | PRN
  Administered 2024-11-19: 10 mL via INTRAVENOUS

## 2024-11-19 NOTE — Discharge Instructions (Signed)
 Please decrease Toprol  XL to 50 mg (1 tablet) daily starting tomorrow due to low heartrate. Remain on amiodarone  as directed.  Dr. Mona

## 2024-11-19 NOTE — Interval H&P Note (Signed)
 History and Physical Interval Note:  11/19/2024 8:42 AM  [name]  has presented today for surgery, with the diagnosis of afib.  The various methods of treatment have been discussed with the patient and family. After consideration of risks, benefits and other options for treatment, the patient has consented to  Procedures: CARDIOVERSION (N/A) as a surgical intervention.  The patient's history has been reviewed, patient examined, no change in status, stable for surgery.  I have reviewed the patient's chart and labs.  Questions were answered to the patient's satisfaction.     Destiny Elliott

## 2024-11-19 NOTE — Transfer of Care (Signed)
 Immediate Anesthesia Transfer of Care Note  Patient: Destiny Elliott  Procedure(s) Performed: CARDIOVERSION  Patient Location: PACU and Cath Lab  Anesthesia Type:MAC  Level of Consciousness: drowsy  Airway & Oxygen Therapy: Patient Spontanous Breathing and Patient connected to nasal cannula oxygen  Post-op Assessment: Report given to RN and Post -op Vital signs reviewed and stable  Post vital signs: Reviewed and stable  Last Vitals:  Vitals Value Taken Time  BP 119/64 11/20/23 1010  Temp    Pulse 39 11/20/23 1010  Resp 19 11/20/23 1010  SpO2 99%  11/20/23 1010    Last Pain:  Vitals:   11/19/24 0846  TempSrc: Temporal         Complications: No notable events documented.

## 2024-11-19 NOTE — CV Procedure (Signed)
" ° ° °  CARDIOVERSION NOTE  Procedure: Electrical Cardioversion Indications:  Atrial Flutter  Procedure Details:  Consent: Risks of procedure as well as the alternatives and risks of each were explained to the (patient/caregiver).  Consent for procedure obtained.  Time Out: Verified patient identification, verified procedure, site/side was marked, verified correct patient position, special equipment/implants available, medications/allergies/relevent history reviewed, required imaging and test results available.  Performed  Patient placed on cardiac monitor, pulse oximetry, supplemental oxygen as necessary.  Sedation given: propofol  per anesthesia Pacer pads placed anterior and posterior chest.  Cardioverted 1 time(s).  Cardioverted at 200J biphasic.  Impression: Findings: Post procedure EKG shows: sinus bradycardia Complications: None Patient did tolerate procedure well.  Plan: Successful DCCV with a single 200J biphasic shock to sinus bradycardia.  Time Spent Directly with the Patient:  30 minutes   Destiny KYM Maxcy, MD, [facility], FNLA, FACP  [place]  [facility]  Medical Director of the Advanced Lipid Disorders &  Cardiovascular Risk Reduction Clinic Diplomate of the American Board of Clinical Lipidology Attending Cardiologist  Direct Dial: [phone]  Fax: [phone]  Website:  [email or web address] Destiny Elliott 11/19/2024, 10:06 AM     "

## 2024-11-19 NOTE — Telephone Encounter (Signed)
 Pt c/o medication issue:  1. Name of Medication: amiodarone  (PACERONE ) 200 MG tablet   2. How are you currently taking this medication (dosage and times per day)? Unknown   3. Are you having a reaction (difficulty breathing--STAT)? No   4. What is your medication issue? Pt's daughter wanted to know medication dose since cardioversion has been done. Please advise

## 2024-11-19 NOTE — Telephone Encounter (Signed)
 Spoke to dtr, advised to decrease to once daily tomorrow, they are not going to take tonight's dose per preference.

## 2024-11-19 NOTE — Anesthesia Preprocedure Evaluation (Signed)
"                                    Anesthesia Evaluation  Patient identified by MRN, date of birth, ID band Patient awake    Reviewed: Allergy & Precautions, H&P , NPO status , Patient's Chart, lab work & pertinent test results  Airway Mallampati: II   Neck ROM: full    Dental   Pulmonary former smoker   breath sounds clear to auscultation       Cardiovascular hypertension, +CHF  + dysrhythmias Atrial Fibrillation  Rhythm:regular Rate:Normal     Neuro/Psych Seizures -,  PSYCHIATRIC DISORDERS Anxiety Depression Bipolar Disorder      GI/Hepatic   Endo/Other    Renal/GU      Musculoskeletal   Abdominal   Peds  Hematology   Anesthesia Other Findings   Reproductive/Obstetrics                              Anesthesia Physical Anesthesia Plan  ASA: 3  Anesthesia Plan: General   Post-op Pain Management:    Induction: Intravenous  PONV Risk Score and Plan: 3 and Propofol  infusion and Treatment may vary due to age or medical condition  Airway Management Planned: Nasal Cannula  Additional Equipment:   Intra-op Plan:   Post-operative Plan:   Informed Consent: I have reviewed the patients History and Physical, chart, labs and discussed the procedure including the risks, benefits and alternatives for the proposed anesthesia with the patient or authorized representative who has indicated his/her understanding and acceptance.     Dental advisory given  Plan Discussed with: CRNA, Anesthesiologist and Surgeon  Anesthesia Plan Comments:         Anesthesia Quick Evaluation  "

## 2024-11-19 NOTE — Anesthesia Postprocedure Evaluation (Signed)
"   Anesthesia Post Note  Patient: Destiny Elliott  Procedure(s) Performed: CARDIOVERSION     Patient location during evaluation: Cath Lab Anesthesia Type: General Level of consciousness: awake and alert Pain management: pain level controlled Vital Signs Assessment: post-procedure vital signs reviewed and stable Respiratory status: spontaneous breathing, nonlabored ventilation, respiratory function stable and patient connected to nasal cannula oxygen Cardiovascular status: blood pressure returned to baseline and stable Postop Assessment: no apparent nausea or vomiting Anesthetic complications: no   No notable events documented.  Last Vitals:  Vitals:   11/19/24 1040 11/19/24 1050  BP: (!) 144/70 (!) 153/70  Pulse: (!) 39 (!) 38  Resp: 17 18  Temp:    SpO2: 93% 94%    Last Pain:  Vitals:   11/19/24 1040  TempSrc:   PainSc: 0-No pain                 Shantana Christon S      "

## 2024-11-20 ENCOUNTER — Encounter (HOSPITAL_COMMUNITY): Payer: Self-pay | Admitting: Internal Medicine

## 2024-11-20 ENCOUNTER — Telehealth: Payer: Self-pay | Admitting: Cardiology

## 2024-11-20 NOTE — Telephone Encounter (Signed)
 Pt c/o medication issue:  1. Name of Medication: metoprolol  succinate (TOPROL -XL) 50 MG 24 hr tablet   2. How are you currently taking this medication (dosage and times per day)?    3. Are you having a reaction (difficulty breathing--STAT)? no  4. What is your medication issue? Patient's HR is low, daughter calling to see if she still suppose to give her medication. Please advise

## 2024-11-20 NOTE — Telephone Encounter (Signed)
 Daughter reports patients BP was 139/70 with HR 44 this morning at 10. Metoprolol  50 mg and amiodarone  were not administered. States HR last night was 104/57 with HR 40 and did not take amiodarone .Denies any symptoms.  Daughter was instructed that it is ok to hold dose until hearing back from Dr Mona. Advised to continue monitoring blood pressure and heart rate. Daughter was reassured that she acted appropriately and encouraged to call with any concerns while awaiting provider follow-up.

## 2024-11-20 NOTE — Telephone Encounter (Signed)
 Called Destiny Elliott, DRP and patient  made them aware  per Dr. Mona and Dr. Kate to stop metoprolol  and continue amiodarone . Made daughter aware to continue take patient blood pressure and  heart rate and send those reading. Pt and daughter verbalized an understanding

## 2024-11-27 ENCOUNTER — Ambulatory Visit: Admitting: Cardiology

## 2024-12-05 ENCOUNTER — Ambulatory Visit: Admitting: Pharmacist

## 2024-12-10 NOTE — Progress Notes (Unsigned)
 Patient ID: Destiny Elliott                 DOB: Apr 09, 1951                      MRN: [id number]      HPI: Destiny Elliott is a 74 y.o. female referred by Dr. Kate to HTN clinic. PMH is significant for SVT, HTN, HLD, Afib, CVA and cerebral amyloid angiopathy, and cardiomyopathy (LVEF 35%). She presents today to the HTN clinic for follow up.   Patient originally was on hydrochlorothiazide  25 mg daily but was d/c by patient and olmesartan  10mg  was started instead. At last visit with MD 08/22/2024 she was found to be in A-fib and atenolol  was increased from 75 mg to 100mg  daily.   On 09/27/2024 she presented to the ED due to shortness of breath.  EKG showed atrial fib/flutter with heart rate 127 bpm.  BNP elevated to 780.  Chest x-ray showed cardiomegaly with bilateral pleural effusions suggestive of CHF.  She was given IV Lasix  in the ED which helped her breathing, however she continued to be tachycardic. She  was transition to metoprolol .  Echocardiogram on 09/29/2024 showed EF 35% with mild LVH, mildly reduced RV systolic function, mild mitral valve regurgitation due to mild prolapse of the anterior mitral valve leaflet.  Patient was started on IV amiodarone .  During her admission she had possible seizure-like activity.  Underwent brain MRI that showed a recent CVA and amyloid and angiopathy with increased risk of intracranial hemorrhage.  She underwent TEE guided cardioversion on 10/02/2024 with successful cardioversion to normal sinus rhythm with PACs. She was discharged on amiodarone , metoprolol , Eliquis , Entresto , and spironolactone .  At PCP visit her metoprolol  succinate 25 mg daily was changed to metoprolol  tartrate 50 mg twice daily. However  metoprolol  was stopped on 11/20/24 due to low HR (in 40s) while also being on amiodarone      She is gaining her strength back waiting for PT to call back and schedule for sessions. Her daughter looks after her now and cook heart healthy meals so she  can control her sodium intake    Current HTN meds:  Entresto  ? Spironolactone ?  Previously tried: amlodipine; hydrochlorothiazide  (inadequate response) BP goal: <130/80   Diet: home cooked heart healthy meals   Exercise: none    Home BP readings: varies a lot - from past 2 weeks 150-170/ 90-110 and her HR 110 and some days higher    Wt Readings from Last 3 Encounters:  11/12/24 123 lb 12.8 oz (56.2 kg)  10/25/24 121 lb 9.6 oz (55.2 kg)  10/17/24 119 lb (54 kg)   BP Readings from Last 3 Encounters:  11/19/24 (!) 153/70  11/12/24 (!) 168/116  10/25/24 (!) 158/94   Pulse Readings from Last 3 Encounters:  11/19/24 (!) 38  11/12/24 100  10/25/24 94    Renal function: CrCl cannot be calculated (Patient's most recent lab result is older than the maximum 21 days allowed.).  Past Medical History:  Diagnosis Date   Abnormal Pap smear 1994   --CIN I   Allergic rhinitis    Anxiety    Dense breasts    Endocervical polyp 01/1993   -benign   Hypercholesterolemia    Hypertension    Osteopenia    Paroxysmal atrial fibrillation (HCC)     Current Outpatient Medications on File Prior to Visit  Medication Sig Dispense Refill   amiodarone  (PACERONE ) 200 MG tablet Take  1 tablet (200 mg total) by mouth daily. (Patient taking differently: Take 200 mg by mouth 2 (two) times daily.) 90 tablet 3   apixaban  (ELIQUIS ) 5 MG TABS tablet Take 1 tablet (5 mg total) by mouth 2 (two) times daily. 180 tablet 1   ARIPiprazole  (ABILIFY ) 2 MG tablet Take 2 mg by mouth daily.     levETIRAcetam  (KEPPRA ) 500 MG tablet Take 1 tablet (500 mg total) by mouth 2 (two) times daily. 60 tablet 1   metoprolol  succinate (TOPROL -XL) 50 MG 24 hr tablet Take 1 tablet (50 mg total) by mouth daily. Take with or immediately following a meal. 180 tablet 3   sodium chloride  (OCEAN) 0.65 % SOLN nasal spray Place into both nostrils daily.     No current facility-administered medications on file prior to visit.     Allergies  Allergen Reactions   Ambien [Zolpidem Tartrate] Other (See Comments)    Causes depression   Gabapentin Other (See Comments)    Hypertension   Nickel Hives and Itching   Nortriptyline Other (See Comments)    Causes dry sinuses,dry mouth   Paxil [Paroxetine Hcl] Other (See Comments)    Agitation   Remeron [Mirtazapine] Other (See Comments)    Agitation and depression   Trazodone  Hcl Other (See Comments)    Agitation   Zoloft [Sertraline Hcl] Other (See Comments)    Agitation   Zolpidem Other (See Comments)    Other Reaction(s): personality change   Amlodipine Besylate Other (See Comments)    Patient cannot recall.    Benadryl  [Diphenhydramine  Hcl] Other (See Comments)    Causes nasal dryness   Buspar [Buspirone] Other (See Comments)    Abnormal sensory issues   Dextromethorphan-Guaifenesin  Other (See Comments)    Made patient mucus passages extremely dry   Flonase [Fluticasone Propionate] Other (See Comments)    Reaction type/severity unknown    Nirmatrelvir-Ritonavir Diarrhea    Other Reaction(s): diarrhea   Tylenol  Pm Extra [Diphenhydramine -Apap (Sleep)] Other (See Comments)    Causes nasal dryness   Vitamin D  Analogs Other (See Comments)    doesn't take Vit D supplements because they make her feel bad     Last menstrual period 10/14/2002.   Assessment/Plan:  1. Hypertension -  Assessment: BP is un/controlled in office BP ***/ *** mmHg above the goal (<130/80).  Tolerates ***well without any side effects  Denies SOB, palpitation, chest pain, headaches,or swelling  Reiterated the importance of regular exercise and low salt diet   Plan:  Start taking Continue taking  Patient to keep record of BP readings with heart rate and report to us  at the next visit Patient to see PharmD in *** weeks for follow up  Follow up lab(s)      Thank you  Destiny Elliott, Pharm.D [place] Destiny Elliott. [facility] [address]  [address], [place], [place] [address] Phone: [phone]; Fax: [phone]

## 2024-12-11 ENCOUNTER — Ambulatory Visit: Payer: Self-pay | Admitting: Pharmacist

## 2024-12-12 ENCOUNTER — Ambulatory Visit: Admitting: Cardiology

## 2024-12-13 ENCOUNTER — Ambulatory Visit: Attending: Cardiology | Admitting: Cardiology

## 2024-12-13 ENCOUNTER — Encounter: Payer: Self-pay | Admitting: Cardiology

## 2024-12-13 ENCOUNTER — Other Ambulatory Visit (HOSPITAL_COMMUNITY): Payer: Self-pay

## 2024-12-13 VITALS — BP 144/80 | HR 73 | Ht 62.0 in | Wt 120.5 lb

## 2024-12-13 DIAGNOSIS — I680 Cerebral amyloid angiopathy: Secondary | ICD-10-CM

## 2024-12-13 DIAGNOSIS — R002 Palpitations: Secondary | ICD-10-CM

## 2024-12-13 DIAGNOSIS — D6869 Other thrombophilia: Secondary | ICD-10-CM

## 2024-12-13 DIAGNOSIS — I471 Supraventricular tachycardia, unspecified: Secondary | ICD-10-CM

## 2024-12-13 DIAGNOSIS — I484 Atypical atrial flutter: Secondary | ICD-10-CM

## 2024-12-13 DIAGNOSIS — I4819 Other persistent atrial fibrillation: Secondary | ICD-10-CM | POA: Diagnosis not present

## 2024-12-13 DIAGNOSIS — I493 Ventricular premature depolarization: Secondary | ICD-10-CM

## 2024-12-13 NOTE — Patient Instructions (Signed)
 Medication Instructions:  Your physician recommends that you continue on your current medications as directed. Please refer to the Current Medication list given to you today.  *If you need a refill on your cardiac medications before your next appointment, please call your pharmacy*  Follow-Up: At [facility], you and your health needs are our priority.  As part of our continuing mission to provide you with exceptional heart care, our providers are all part of one team.  This team includes your primary Cardiologist (physician) and Advanced Practice Providers or APPs (Physician Assistants and Nurse Practitioners) who all work together to provide you with the care you need, when you need it.  Your next appointment:   6 week(s)  Provider:   You will see one of the following Advanced Practice Providers on your designated Care Team:   Charlies Arthur, [place] Ozell Jodie Passey, PA-C Suzann Riddle, NP Daphne Barrack, NP Artist Pouch, PA-C  Odis Phoenix, PA-C

## 2024-12-13 NOTE — Progress Notes (Unsigned)
 " Electrophysiology Office Note:   Date:  12/15/2024  ID:  Destiny Elliott, DOB 18-May-1951, MRN [id number]  Primary Cardiologist: Lonni LITTIE Nanas, MD Electrophysiologist: Eulas FORBES Furbish, MD      History of Present Illness:   Destiny Elliott is a 74 y.o. female with h/o SVT, HTN, HLD, bipolar disorder, atrial flutter, atrial fibrillation who is being seen today for AF management.  Discussed the use of AI scribe software for clinical note transcription with the patient, who gave verbal consent to proceed.  History of Present Illness Destiny Elliott is a 74 year old female with atrial fibrillation and flutter who presents for evaluation of her cardiac rhythm management. She was originally referred by the cardiologist at the hospital for further evaluation of her atrial fibrillation and consideration of the Watchman device.  After our first visit, we loaded her on amiodarone  and sent for cardioversion. She reports feeling better after the cardioversion with return to sinus. Now back in AFL but still feeling relatively well. She remains very concerned about her long term bleeding risk. No new or acute complaints today.   Review of systems complete and found to be negative unless listed in HPI.   EP Information / Studies Reviewed:    EKG is ordered today. Personal review as below.  EKG Interpretation Date/Time:  Friday December 13 2024 11:10:06 EST Ventricular Rate:  73 PR Interval:  80 QRS Duration:  144 QT Interval:  620 QTC Calculation: 683 R Axis:   90  Text Interpretation: Atypical atrial flutter Rightward axis Non-specific intra-ventricular conduction block Minimal voltage criteria for LVH, may be normal variant ( Cornell product ) Nonspecific T wave abnormality When compared with ECG of 19-Nov-2024 10:09, Atrial flutter has replaced sinus rhythm Confirmed by Kennyth Chew [phone]) on 12/15/2024 5:01:58 PM   TEE 10/02/24:  1. No LAA thormbus. On Rx eliquis  DCC x 1  with 200 J biphasic Converted  to NSR with PAC;s IV amiodarone  continued.   2. EF estimated while in rapid afib rate 124 bpm. Left ventricular  ejection fraction, by estimation, is 35 to 40%. The left ventricle has  moderately decreased function. The left ventricle demonstrates global  hypokinesis.   3. Right ventricular systolic function is normal. The right ventricular  size is normal.   4. Left atrial size was moderately dilated. No left atrial/left atrial  appendage thrombus was detected.   5. Right atrial size was moderately dilated.   6. The mitral valve is abnormal. Moderate mitral valve regurgitation.   7. Abnormal with multiple filamentous structures coming off right cusp  and commisure between left and no cusp. ? Lambl's excrescences. Does not  look like vegetation or papilloma. No significant structural damage to  valve Longest measured filament 0.8  cm. The aortic valve is tricuspid. Aortic valve regurgitation is not  visualized. No aortic stenosis is present.   8. 3D performed of the aortic valve and demonstrates Abnormal AV.   Risk Assessment/Calculations:    CHA2DS2-VASc Score = 6   This indicates a 9.7% annual risk of stroke. The patient's score is based upon: CHF History: 1 HTN History: 1 Diabetes History: 0 Stroke History: 2 Vascular Disease History: 0 Age Score: 1 Gender Score: 1         Physical Exam:   VS:  BP (!) 144/80 (BP Location: Left Arm, Patient Position: Sitting, Cuff Size: Normal)   Pulse 73   Ht 5' 2 (1.575 m)   Wt 120 lb 8  oz (54.7 kg)   LMP 10/14/2002   SpO2 96%   BMI 22.04 kg/m    Wt Readings from Last 3 Encounters:  12/13/24 120 lb 8 oz (54.7 kg)  11/12/24 123 lb 12.8 oz (56.2 kg)  10/25/24 121 lb 9.6 oz (55.2 kg)     General: Well developed, in no acute distress.  Neck: No JVD.  Cardiac: Normal rate, regular rhythm.  Resp: Normal work of breathing.  Ext: No edema.  Neuro: No gross focal deficits.  Psych: Normal affect.     ASSESSMENT AND PLAN:   I have seen Destiny Elliott in the office today who is being considered for a Watchman left atrial appendage closure device. I believe they will benefit from this procedure given their history of atrial fibrillation, CHA2DS2-VASc score of 4 and unadjusted ischemic stroke rate of 4.8% per year. Unfortunately, the patient is not felt to be a long term anticoagulation candidate secondary to elevated intracranial bleeding risk secondary to cerebral amyloid angiopathy. The patient's chart has been reviewed and I feel that they would be a candidate for short term oral anticoagulation after Watchman implant.   It is my belief that after undergoing a LAA closure procedure, Destiny Elliott will not need long term anticoagulation which eliminates anticoagulation side effects and major bleeding risk.   Procedural risks for the Watchman implant have been reviewed with the patient including a 0.5% risk of stroke, <1% risk of perforation and <1% risk of device embolization. Other risks include bleeding, vascular damage, tamponade, worsening renal function, and death. The patient understands these risk and wishes to proceed.     The published clinical data on the safety and effectiveness of WATCHMAN include but are not limited to the following: - Holmes DR, Jess BEARD, Sick P et al. for the PROTECT AF Investigators. Percutaneous closure of the left atrial appendage versus warfarin therapy for prevention of stroke in patients with atrial fibrillation: a randomised non-inferiority trial. Lancet 2009; 374: 534-42. GLENWOOD Jess BEARD, Doshi SK, Jonita VEAR Satchel D et al. on behalf of the PROTECT AF Investigators. Percutaneous Left Atrial Appendage Closure for Stroke Prophylaxis in Patients With Atrial Fibrillation 2.3-Year Follow-up of the PROTECT AF (Watchman Left Atrial Appendage System for Embolic Protection in Patients With Atrial Fibrillation) Trial. Circulation 2013; 127:720-729. - Alli O, Doshi  S,  Kar S, Reddy VY, Sievert H et al. Quality of Life Assessment in the Randomized PROTECT AF (Percutaneous Closure of the Left Atrial Appendage Versus Warfarin Therapy for Prevention of Stroke in Patients With Atrial Fibrillation) Trial of Patients at Risk for Stroke With Nonvalvular Atrial Fibrillation. J Am Coll Cardiol 2013; 61:1790-8. GLENWOOD Satchel DR, Archer RAMAN, Price M, Whisenant B, Sievert H, Doshi S, Huber K, Reddy V. Prospective randomized evaluation of the Watchman left atrial appendage Device in patients with atrial fibrillation versus long-term warfarin therapy; the PREVAIL trial. Journal of the [employer] of Cardiology, Vol. 4, No. 1, 2014, 1-11. - Kar S, Doshi SK, Sadhu A, Horton R, Osorio J et al. Primary outcome evaluation of a next-generation left atrial appendage closure device: results from the PINNACLE FLX trial. Circulation 2021;143(18)1754-1762.   HAS-BLED score 3 Hypertension Yes  Abnormal renal and liver function (Dialysis, transplant, Cr >2.26 mg/dL /Cirrhosis or Bilirubin >2x Normal or AST/ALT/AP >3x Normal) No  Stroke Yes  Bleeding No  Labile INR (Unstable/high INR) No  Elderly (>65) Yes  Drugs or alcohol (>= 8 drinks/week, anti-plt or NSAID) No   CHA2DS2-VASc Score = 6  The patient's score is based upon: CHF History: 1 HTN History: 1 Diabetes History: 0 Stroke History: 2 Vascular Disease History: 0 Age Score: 1 Gender Score: 1       ASSESSMENT AND PLAN:  #Persistent atrial fibrillation and flutter: Associated with decline in LVEF.  For this reason, we have prioritized a rhythm control strategy. She initially had restoration of sinus with amio and cardioversion but today appears to be in atypical flutter that is rate controlled. I do not think this is heart block as her P wave morphology is very different compared to the ECG immediately after cardioversion that is clearly sinus. #High risk medication use: Amiodarone . #Chronic systolic heart failure: Likely  tachy/arrhythmia mediated.  Well compensated on exam today.  - Although she appears to be in AFL now, rates are much better controlled. Still recommend repeating echocardiogram to see if there has been any improvement in EF. It is possible her AF/AFL is now paroxysmal on amiodarone  but we only have 1 data point for comparison.  - We again discussed long term options such as remaining on amiodarone , as long as we think there is benefit, vs proceeding with catheter ablation. If she chooses to pursue Watchman implant then I think benefits of catheter ablation exceed the incremental risk during a concomitant procedure.  - She will follow up with EP team after having her repeat echocardiogram to make final decision regarding rhythm control and Watchman.   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  Given her increased risk for intracranial bleeding, she was referred for consideration of Watchman device.  I believe she would be an appropriate candidate for Watchman device implant.  Watchman implant could be performed either as a stand-alone procedure or concomitantly with AF ablation should she choose to do so.  She will continue Eliquis  until she makes her decision   Follow up with EP Team in 4 weeks  Signed, Fonda Kitty, MD  "

## 2024-12-19 ENCOUNTER — Other Ambulatory Visit: Payer: Self-pay

## 2024-12-19 DIAGNOSIS — M8580 Other specified disorders of bone density and structure, unspecified site: Secondary | ICD-10-CM | POA: Insufficient documentation

## 2024-12-19 DIAGNOSIS — E7800 Pure hypercholesterolemia, unspecified: Secondary | ICD-10-CM | POA: Insufficient documentation

## 2024-12-19 DIAGNOSIS — R9230 Dense breasts, unspecified: Secondary | ICD-10-CM | POA: Insufficient documentation

## 2024-12-19 DIAGNOSIS — J309 Allergic rhinitis, unspecified: Secondary | ICD-10-CM | POA: Insufficient documentation

## 2024-12-19 DIAGNOSIS — F419 Anxiety disorder, unspecified: Secondary | ICD-10-CM | POA: Insufficient documentation

## 2024-12-19 NOTE — Progress Notes (Unsigned)
 " Cardiology Office Note:    Date:  12/20/2024   ID:  Destiny Elliott, DOB 24-May-1951, MRN [id number]  PCP:  Chet Mad, DO  Cardiologist:  Lonni LITTIE Nanas, MD  Electrophysiologist:  Eulas FORBES Furbish, MD   Referring MD: Verena Mems, MD   Chief Complaint  Patient presents with   Atrial Fibrillation    History of Present Illness:    Destiny Elliott is a 74 y.o. female with a hx of SVT, atrial fibrillation, hypertension, hyperlipidemia who presents for follow-up.  She was referred by Dr. Chrystal for evaluation of palpitations, initially seen 12/22/2023.  Echocardiogram 01/2024 showed EF 60 to 65%, grade 2 diastolic dysfunction, normal RV function, moderate elevated pulmonary pressures, severe left atrial enlargement, no significant valvular disease.  Zio patch x 9 days 01/2024 showed 1925 episodes of SVT, longest lasting 13 seconds, frequent PACs (11.7%) supraventricular couplets (7.3%) supraventricular couplets (3.7%), and frequent PVCs (7.2%).  Found to be in atrial fibrillation at appointment with EP on 09/02/2024.  She was started on Eliquis  with plans for DCCV in 3 weeks.  Subsequently presented to ED 09/27/2024 with shortness of breath.  Echocardiogram showed EF 35%.  He was started on IV amiodarone  and underwent TEE/DCCV on 10/02/2024 with successful cardioversion to sinus rhythm.  Brain MRI during admission showed recent CVA and amyloid angiopathy with increased risk of intracranial hemorrhage.  She was referred to Dr. Kennyth in EP to consider Watchman/ablation.  Since last clinic visit, he reports he is doing okay.  Reports sometimes has weakness in her legs. Denies any chest pain, dyspnea, lightheadedness, syncope.  Has noted some swelling in legs.  Does report some palpitations.  She is taking Eliquis , denies any bleeding issues.  Reports BP up to 140s when checks at home.  Past Medical History:  Diagnosis Date   Abnormal Pap smear 1994   --CIN I   Allergic  rhinitis    Anxiety    Dense breasts    Endocervical polyp 01/1993   -benign   Hypercholesterolemia    Hypertension    Osteopenia    Paroxysmal atrial fibrillation [facility])     Past Surgical History:  Procedure Laterality Date   CARDIOVERSION N/A 10/02/2024   Procedure: CARDIOVERSION;  Surgeon: Delford Maude BROCKS, MD;  Location: [facility] INVASIVE CV LAB;  Service: Cardiovascular;  Laterality: N/A;   CARDIOVERSION N/A 11/19/2024   Procedure: CARDIOVERSION;  Surgeon: Mona Vinie BROCKS, MD;  Location: [facility] INVASIVE CV LAB;  Service: Cardiovascular;  Laterality: N/A;   CERVICAL BIOPSY  W/ LOOP ELECTRODE EXCISION  03/1993   CIN I   COLONOSCOPY  2016   endocervical polyp  01-27-93   -benign   TOOTH EXTRACTION  08/2017   TRANSESOPHAGEAL ECHOCARDIOGRAM (CATH LAB) N/A 10/02/2024   Procedure: TRANSESOPHAGEAL ECHOCARDIOGRAM;  Surgeon: Delford Maude BROCKS, MD;  Location: [facility] INVASIVE CV LAB;  Service: Cardiovascular;  Laterality: N/A;    Current Medications: Current Meds  Medication Sig   amiodarone  (PACERONE ) 200 MG tablet Take 1 tablet (200 mg total) by mouth daily.   apixaban  (ELIQUIS ) 5 MG TABS tablet Take 1 tablet (5 mg total) by mouth 2 (two) times daily.   ARIPiprazole  (ABILIFY ) 2 MG tablet Take 2 mg by mouth daily.   levETIRAcetam  (KEPPRA ) 500 MG tablet Take 1 tablet (500 mg total) by mouth 2 (two) times daily.   sacubitril -valsartan  (ENTRESTO ) 24-26 MG Take 1 tablet by mouth 2 (two) times daily.   sodium chloride  (OCEAN) 0.65 % SOLN nasal spray Place into  both nostrils daily.   [DISCONTINUED] metoprolol  succinate (TOPROL -XL) 50 MG 24 hr tablet Take 1 tablet (50 mg total) by mouth daily. Take with or immediately following a meal.     Allergies:   Ambien [zolpidem tartrate], Gabapentin, Nickel, Nortriptyline, Paxil [paroxetine hcl], Remeron [mirtazapine], Trazodone  hcl, Zoloft [sertraline hcl], Zolpidem, Amlodipine besylate, Benadryl  [diphenhydramine  hcl], Buspar [buspirone], Dextromethorphan-guaifenesin ,  Flonase [fluticasone propionate], Nirmatrelvir-ritonavir, Tylenol  pm extra [diphenhydramine -apap (sleep)], and Vitamin d  analogs   Social History   Socioeconomic History   Marital status: Married    Spouse name: Todd   Number of children: 2   Years of education: 12   Highest education level: Not on file  Occupational History   Occupation: [employer]    Comment: self employed  Tobacco Use   Smoking status: Former    Current packs/day: 0.00    Average packs/day: 0.5 packs/day for 28.0 years (14.0 ttl pk-yrs)    Types: Cigarettes    Start date: 07/01/1976    Quit date: 07/01/2004    Years since quitting: 20.4   Smokeless tobacco: Never  Vaping Use   Vaping status: Never Used  Substance and Sexual Activity   Alcohol use: Not Currently    Comment: occ wine   Drug use: No   Sexual activity: Not Currently    Partners: Male    Birth control/protection: Post-menopausal    Comment: married  Other Topics Concern   Not on file  Social History Narrative   Lives with Krystal, one child deceased   Caffeine- 2 cups of tea   Social Drivers of Health   Tobacco Use: Medium Risk (12/20/2024)   Patient History    Smoking Tobacco Use: Former    Smokeless Tobacco Use: Never    Passive Exposure: Not on Actuary Strain: Not on file  Food Insecurity: No Food Insecurity (09/28/2024)   Epic    Worried About Programme Researcher, Broadcasting/film/video in the Last Year: Never true    Ran Out of Food in the Last Year: Never true  Transportation Needs: No Transportation Needs (09/28/2024)   Epic    Lack of Transportation (Medical): No    Lack of Transportation (Non-Medical): No  Physical Activity: Not on file  Stress: Not on file  Social Connections: Moderately Integrated (09/28/2024)   Social Connection and Isolation Panel    Frequency of Communication with Friends and Family: More than three times a week    Frequency of Social Gatherings with Friends and Family: More than three times a week     Attends Religious Services: More than 4 times per year    Active Member of [employer] or Organizations: No    Attends Banker Meetings: Never    Marital Status: Married  Depression (PHQ2-9): Not on file  Alcohol Screen: Not on file  Housing: Low Risk (09/28/2024)   Epic    Unable to Pay for Housing in the Last Year: No    Number of Times Moved in the Last Year: 0    Homeless in the Last Year: No  Utilities: Not At Risk (09/28/2024)   Epic    Threatened with loss of utilities: No  Health Literacy: Not on file     Family History: The patient's family history includes Breast cancer (age of onset: 34) in her sister; COPD in her mother and sister; Heart disease in her mother; Heart disease (age of onset: 67) in her brother; Heart failure in her mother; Hyperlipidemia in her sister; Hypertension in  her father and mother; Hypertension (age of onset: 46) in her sister; Kidney disease in her father; Stroke (age of onset: 75) in her father; Suicidality (age of onset: 40) in her son; Thyroid  disease (age of onset: 20) in her sister; Thyroid  disease (age of onset: 80) in her mother.  ROS:   Please see the history of present illness.     All other systems reviewed and are negative.  EKGs/Labs/Other Studies Reviewed:    The following studies were reviewed today:   EKG:   12/22/2023: Sinus rhythm with PACs/PVCs.  Rate 64, QTc 453 12/20/2024: Different P wave morphology from baseline, suspect ectopic atrial rhythm, rate 64  Recent Labs: 09/27/2024: TSH 1.129 09/28/2024: B Natriuretic Peptide 780.2 10/04/2024: ALT 21 10/09/2024: Magnesium  2.0 11/13/2024: BUN 19; Creatinine, Ser 1.05; Hemoglobin 12.1; Platelets 216; Potassium 4.1; Sodium 138  Recent Lipid Panel    Component Value Date/Time   CHOL 133 09/30/2024 0630   TRIG 75 09/30/2024 0630   HDL 24 (L) 09/30/2024 0630   CHOLHDL 5.5 09/30/2024 0630   VLDL 15 09/30/2024 0630   LDLCALC 94 09/30/2024 0630    Physical Exam:     VS:  BP (!) 190/110   Pulse 64   Ht 5' 2 (1.575 m)   Wt 118 lb 14.4 oz (53.9 kg)   LMP 10/14/2002   SpO2 96%   BMI 21.75 kg/m     Wt Readings from Last 3 Encounters:  12/20/24 118 lb 14.4 oz (53.9 kg)  12/13/24 120 lb 8 oz (54.7 kg)  11/12/24 123 lb 12.8 oz (56.2 kg)     GEN:  Well nourished, well developed in no acute distress HEENT: Normal NECK: No JVD; No carotid bruits LYMPHATICS: No lymphadenopathy CARDIAC: RRR, no murmurs, rubs, gallops RESPIRATORY:  Clear to auscultation without rales, wheezing or rhonchi  ABDOMEN: Soft, non-tender, non-distended MUSCULOSKELETAL:  No edema; No deformity  SKIN: Warm and dry NEUROLOGIC:  Alert and oriented x 3 PSYCHIATRIC:  Normal affect   ASSESSMENT:    1. Persistent atrial fibrillation (HCC)   2. Chronic systolic heart failure (HCC)   3. Mitral valve insufficiency, unspecified etiology   4. Cerebral amyloid angiopathy (CODE)   5. Hypertension, unspecified type     PLAN:    Persistent atrial fibrillation/flutter: Found to be in atrial fibrillation at appointment with EP on 09/02/2024.  She was started on Eliquis  with plans for DCCV in 3 weeks.  Subsequently presented to ED 09/27/2024 with shortness of breath.  Echocardiogram showed EF 35%.  He was started on IV amiodarone  and underwent TEE/DCCV on 10/02/2024 with successful cardioversion to sinus rhythm.  Brain MRI during admission showed recent CVA and amyloid angiopathy with increased risk of intracranial hemorrhage.  She was referred to Dr. Kennyth in EP to consider Watchman/ablation. - Follows with Dr. Kennyth in EP, considering watchman/ablation - Stopped toprol  XL due to bradycardia - Continue amiodarone  200 mg daily for now.  Check CMET, TSH - Continue Eliquis  5 mg twice daily  Chronic systolic heart failure: Echo 09/2024 with EF 35%, suspect tachycardia mediated in setting of A-fib with RVR - Stopped Toprol -XL due to bradycardia.  Previously was on Entresto  and  spironolactone  as well but stopped due to soft BP.  Her BP is now significantly elevated, will add Entresto  24-26 mg twice daily.  Check BMET today and again in 1 week  Mitral regurgitation: Moderate on TEE 09/2024.  Likely functional in setting of systolic heart failure as above.  Will monitor for  improvement  CVA: Brain MRI during admission 09/2024 noted CVA with amyloid angiopathy, seen increases risk of intracranial hemorrhage.  She has been referred to EP for watchman evaluation  SVT/PACs/PVCs: She reported palpitations.  Echocardiogram 01/2024 showed EF 60 to 65%, grade 2 diastolic dysfunction, normal RV function, moderate elevated pulmonary pressures, severe left atrial enlargement, no significant valvular disease.  Zio patch x 9 days 01/2024 showed 1925 episodes of SVT, longest lasting 13 seconds, frequent PACs (11.7%) supraventricular couplets (7.3%) supraventricular couplets (3.7%), and frequent PVCs (7.2%). - Continue amiodarone  as above  Pulmonary hypertension: Moderate pulmonary hypertension on echocardiogram 01/2024.  Suspect likely group 2 due to heart failure, echo shows G2DD.  Had previously discussed RHC for further evaluation but she had declined  Hypertension: BP significantly elevated in clinic today, mildly elevated on home log.  Start Entresto  as above  Hyperlipidemia: Given found to have CVA 09/2024, she should be on statin.  Will check lipid panel and plan to start statin  RTC in 3 months   Medication Adjustments/Labs and Tests Ordered: Current medicines are reviewed at length with the patient today.  Concerns regarding medicines are outlined above.  Orders Placed This Encounter  Procedures   Comp Met (CMET)   TSH   Basic Metabolic Panel (BMET)   Lipid Profile   EKG 12-Lead   Meds ordered this encounter  Medications   sacubitril -valsartan  (ENTRESTO ) 24-26 MG    Sig: Take 1 tablet by mouth 2 (two) times daily.    Dispense:  60 tablet    Refill:  11    Patient  Instructions  Medication Instructions:  STOP taking Metoprolol  Succinate (Toprol  XL)  START taking Sacubitril -Valsartan  (Entresto ) 24-26 mg. Take one (1) tablet by mouth twice daily.  *If you need a refill on your cardiac medications before your next appointment, please call your pharmacy*  Lab Work: CMP, TSH, Lipid Profile TODAY  BMP in 1 week If you have labs (blood work) drawn today and your tests are completely normal, you will receive your results only by: MyChart Message (if you have MyChart) OR A paper copy in the mail If you have any lab test that is abnormal or we need to change your treatment, we will call you to review the results.  Testing/Procedures: None ordered  Follow-Up: At [facility], you and your health needs are our priority.  As part of our continuing mission to provide you with exceptional heart care, our providers are all part of one team.  This team includes your primary Cardiologist (physician) and Advanced Practice Providers or APPs (Physician Assistants and Nurse Practitioners) who all work together to provide you with the care you need, when you need it.  Your next appointment:   3 month(s)  Provider:   Lonni LITTIE Nanas, MD    We recommend signing up for the patient portal called MyChart.  Sign up information is provided on this After Visit Summary.  MyChart is used to connect with patients for Virtual Visits (Telemedicine).  Patients are able to view lab/test results, encounter notes, upcoming appointments, etc.  Non-urgent messages can be sent to your provider as well.   To learn more about what you can do with MyChart, go to [email or web address].   Other Instructions Your physician has requested that you regularly monitor and record your blood pressure readings at home daily. Please use the same machine at the same time of day to check your readings and record them and send the results in via MyChart.  Make sure to check 2 hours  after your medications.    AVOID these things for 30 minutes before checking your blood pressure: No Drinking caffeine. No Drinking alcohol. No Eating. No Smoking. No Exercising.   Five minutes before checking your blood pressure: Pee. Sit in a dining chair. Avoid sitting in a soft couch or armchair. Be quiet. Do not talk             Signed, Lonni LITTIE Nanas, MD  12/20/2024 4:38 PM    [facility] "

## 2024-12-20 ENCOUNTER — Ambulatory Visit: Admitting: Cardiology

## 2024-12-20 ENCOUNTER — Other Ambulatory Visit (HOSPITAL_COMMUNITY): Payer: Self-pay

## 2024-12-20 ENCOUNTER — Encounter: Payer: Self-pay | Admitting: Cardiology

## 2024-12-20 VITALS — BP 190/110 | HR 64 | Ht 62.0 in | Wt 118.9 lb

## 2024-12-20 DIAGNOSIS — I4819 Other persistent atrial fibrillation: Secondary | ICD-10-CM

## 2024-12-20 DIAGNOSIS — I10 Essential (primary) hypertension: Secondary | ICD-10-CM

## 2024-12-20 DIAGNOSIS — I680 Cerebral amyloid angiopathy: Secondary | ICD-10-CM

## 2024-12-20 DIAGNOSIS — I5022 Chronic systolic (congestive) heart failure: Secondary | ICD-10-CM

## 2024-12-20 DIAGNOSIS — I340 Nonrheumatic mitral (valve) insufficiency: Secondary | ICD-10-CM

## 2024-12-20 MED ORDER — SACUBITRIL-VALSARTAN 24-26 MG PO TABS
1.0000 | ORAL_TABLET | Freq: Two times a day (BID) | ORAL | 11 refills | Status: AC
  Filled 2024-12-20: qty 60, 30d supply, fill #0

## 2024-12-20 NOTE — Patient Instructions (Signed)
 Medication Instructions:  STOP taking Metoprolol  Succinate (Toprol  XL)  START taking Sacubitril -Valsartan  (Entresto ) 24-26 mg. Take one (1) tablet by mouth twice daily.  *If you need a refill on your cardiac medications before your next appointment, please call your pharmacy*  Lab Work: CMP, TSH, Lipid Profile TODAY  BMP in 1 week If you have labs (blood work) drawn today and your tests are completely normal, you will receive your results only by: MyChart Message (if you have MyChart) OR A paper copy in the mail If you have any lab test that is abnormal or we need to change your treatment, we will call you to review the results.  Testing/Procedures: None ordered  Follow-Up: At [facility], you and your health needs are our priority.  As part of our continuing mission to provide you with exceptional heart care, our providers are all part of one team.  This team includes your primary Cardiologist (physician) and Advanced Practice Providers or APPs (Physician Assistants and Nurse Practitioners) who all work together to provide you with the care you need, when you need it.  Your next appointment:   3 month(s)  Provider:   Lonni LITTIE Nanas, MD    We recommend signing up for the patient portal called MyChart.  Sign up information is provided on this After Visit Summary.  MyChart is used to connect with patients for Virtual Visits (Telemedicine).  Patients are able to view lab/test results, encounter notes, upcoming appointments, etc.  Non-urgent messages can be sent to your provider as well.   To learn more about what you can do with MyChart, go to [email or web address].   Other Instructions Your physician has requested that you regularly monitor and record your blood pressure readings at home daily. Please use the same machine at the same time of day to check your readings and record them and send the results in via MyChart.   Make sure to check 2 hours after your  medications.    AVOID these things for 30 minutes before checking your blood pressure: No Drinking caffeine. No Drinking alcohol. No Eating. No Smoking. No Exercising.   Five minutes before checking your blood pressure: Pee. Sit in a dining chair. Avoid sitting in a soft couch or armchair. Be quiet. Do not talk

## 2025-01-14 ENCOUNTER — Ambulatory Visit (HOSPITAL_COMMUNITY)

## 2025-01-20 ENCOUNTER — Ambulatory Visit: Admitting: Pharmacist

## 2025-01-24 ENCOUNTER — Ambulatory Visit: Admitting: Cardiology

## 2025-02-03 ENCOUNTER — Ambulatory Visit: Admitting: Cardiology

## 2025-04-18 ENCOUNTER — Ambulatory Visit: Admitting: Cardiology
